# Patient Record
Sex: Female | Born: 1945 | Race: White | Hispanic: No | Smoking: Never smoker
Health system: Southern US, Community
[De-identification: ages and names within clinical notes are randomized; demographics above are authoritative.]

## PROBLEM LIST (undated history)

## (undated) DIAGNOSIS — C801 Malignant (primary) neoplasm, unspecified: Secondary | ICD-10-CM

## (undated) DIAGNOSIS — R112 Nausea with vomiting, unspecified: Secondary | ICD-10-CM

## (undated) DIAGNOSIS — Z9889 Other specified postprocedural states: Secondary | ICD-10-CM

## (undated) DIAGNOSIS — I10 Essential (primary) hypertension: Secondary | ICD-10-CM

## (undated) HISTORY — PX: ABDOMINAL HYSTERECTOMY: SHX81

## (undated) HISTORY — PX: MASTECTOMY: SHX3

---

## 1994-08-04 DIAGNOSIS — C801 Malignant (primary) neoplasm, unspecified: Secondary | ICD-10-CM

## 1994-08-04 HISTORY — DX: Malignant (primary) neoplasm, unspecified: C80.1

## 1999-05-07 ENCOUNTER — Emergency Department (HOSPITAL_COMMUNITY): Admission: EM | Admit: 1999-05-07 | Discharge: 1999-05-07 | Payer: Self-pay | Admitting: Emergency Medicine

## 2000-10-23 ENCOUNTER — Ambulatory Visit (HOSPITAL_COMMUNITY): Admission: RE | Admit: 2000-10-23 | Discharge: 2000-10-23 | Payer: Self-pay | Admitting: Gastroenterology

## 2000-10-23 ENCOUNTER — Encounter (INDEPENDENT_AMBULATORY_CARE_PROVIDER_SITE_OTHER): Payer: Self-pay | Admitting: Specialist

## 2001-07-12 ENCOUNTER — Other Ambulatory Visit: Admission: RE | Admit: 2001-07-12 | Discharge: 2001-07-12 | Payer: Self-pay | Admitting: *Deleted

## 2009-02-23 ENCOUNTER — Ambulatory Visit: Payer: Self-pay | Admitting: Genetic Counselor

## 2016-10-22 ENCOUNTER — Emergency Department (HOSPITAL_COMMUNITY): Payer: Medicare Other

## 2016-10-22 ENCOUNTER — Encounter (HOSPITAL_COMMUNITY): Payer: Self-pay | Admitting: Emergency Medicine

## 2016-10-22 ENCOUNTER — Inpatient Hospital Stay (HOSPITAL_COMMUNITY)
Admission: EM | Admit: 2016-10-22 | Discharge: 2016-10-30 | DRG: 023 | Disposition: A | Discharge status: Rehabilitation Facility | Payer: Medicare Other | Attending: Internal Medicine | Admitting: Internal Medicine

## 2016-10-22 DIAGNOSIS — G9340 Encephalopathy, unspecified: Secondary | ICD-10-CM | POA: Diagnosis present

## 2016-10-22 DIAGNOSIS — Z79899 Other long term (current) drug therapy: Secondary | ICD-10-CM | POA: Diagnosis not present

## 2016-10-22 DIAGNOSIS — Z6827 Body mass index (BMI) 27.0-27.9, adult: Secondary | ICD-10-CM | POA: Diagnosis not present

## 2016-10-22 DIAGNOSIS — I10 Essential (primary) hypertension: Secondary | ICD-10-CM | POA: Diagnosis present

## 2016-10-22 DIAGNOSIS — Z800 Family history of malignant neoplasm of digestive organs: Secondary | ICD-10-CM | POA: Diagnosis not present

## 2016-10-22 DIAGNOSIS — T82868A Thrombosis of vascular prosthetic devices, implants and grafts, initial encounter: Secondary | ICD-10-CM | POA: Diagnosis not present

## 2016-10-22 DIAGNOSIS — D496 Neoplasm of unspecified behavior of brain: Secondary | ICD-10-CM

## 2016-10-22 DIAGNOSIS — Z90710 Acquired absence of both cervix and uterus: Secondary | ICD-10-CM | POA: Diagnosis not present

## 2016-10-22 DIAGNOSIS — D72820 Lymphocytosis (symptomatic): Secondary | ICD-10-CM | POA: Diagnosis not present

## 2016-10-22 DIAGNOSIS — F419 Anxiety disorder, unspecified: Secondary | ICD-10-CM | POA: Diagnosis not present

## 2016-10-22 DIAGNOSIS — Z853 Personal history of malignant neoplasm of breast: Secondary | ICD-10-CM | POA: Diagnosis not present

## 2016-10-22 DIAGNOSIS — Z95828 Presence of other vascular implants and grafts: Secondary | ICD-10-CM | POA: Diagnosis not present

## 2016-10-22 DIAGNOSIS — E876 Hypokalemia: Secondary | ICD-10-CM | POA: Diagnosis present

## 2016-10-22 DIAGNOSIS — Z9889 Other specified postprocedural states: Secondary | ICD-10-CM | POA: Diagnosis not present

## 2016-10-22 DIAGNOSIS — R739 Hyperglycemia, unspecified: Secondary | ICD-10-CM | POA: Diagnosis present

## 2016-10-22 DIAGNOSIS — R4702 Dysphasia: Secondary | ICD-10-CM | POA: Diagnosis present

## 2016-10-22 DIAGNOSIS — G9389 Other specified disorders of brain: Secondary | ICD-10-CM

## 2016-10-22 DIAGNOSIS — R4701 Aphasia: Secondary | ICD-10-CM | POA: Diagnosis present

## 2016-10-22 DIAGNOSIS — Z803 Family history of malignant neoplasm of breast: Secondary | ICD-10-CM | POA: Diagnosis not present

## 2016-10-22 DIAGNOSIS — T380X5A Adverse effect of glucocorticoids and synthetic analogues, initial encounter: Secondary | ICD-10-CM | POA: Diagnosis present

## 2016-10-22 DIAGNOSIS — B954 Other streptococcus as the cause of diseases classified elsewhere: Secondary | ICD-10-CM | POA: Diagnosis present

## 2016-10-22 DIAGNOSIS — R4189 Other symptoms and signs involving cognitive functions and awareness: Secondary | ICD-10-CM | POA: Diagnosis not present

## 2016-10-22 DIAGNOSIS — G936 Cerebral edema: Secondary | ICD-10-CM | POA: Diagnosis present

## 2016-10-22 DIAGNOSIS — R269 Unspecified abnormalities of gait and mobility: Secondary | ICD-10-CM | POA: Diagnosis not present

## 2016-10-22 DIAGNOSIS — G060 Intracranial abscess and granuloma: Principal | ICD-10-CM | POA: Diagnosis present

## 2016-10-22 DIAGNOSIS — Z9011 Acquired absence of right breast and nipple: Secondary | ICD-10-CM | POA: Diagnosis not present

## 2016-10-22 DIAGNOSIS — G939 Disorder of brain, unspecified: Secondary | ICD-10-CM | POA: Diagnosis not present

## 2016-10-22 DIAGNOSIS — A419 Sepsis, unspecified organism: Secondary | ICD-10-CM

## 2016-10-22 DIAGNOSIS — R410 Disorientation, unspecified: Secondary | ICD-10-CM

## 2016-10-22 DIAGNOSIS — E871 Hypo-osmolality and hyponatremia: Secondary | ICD-10-CM | POA: Diagnosis not present

## 2016-10-22 DIAGNOSIS — B2790 Infectious mononucleosis, unspecified without complication: Secondary | ICD-10-CM | POA: Diagnosis not present

## 2016-10-22 DIAGNOSIS — M7989 Other specified soft tissue disorders: Secondary | ICD-10-CM | POA: Diagnosis not present

## 2016-10-22 DIAGNOSIS — R55 Syncope and collapse: Secondary | ICD-10-CM | POA: Diagnosis not present

## 2016-10-22 DIAGNOSIS — B9689 Other specified bacterial agents as the cause of diseases classified elsewhere: Secondary | ICD-10-CM | POA: Diagnosis not present

## 2016-10-22 DIAGNOSIS — Z882 Allergy status to sulfonamides status: Secondary | ICD-10-CM | POA: Diagnosis not present

## 2016-10-22 DIAGNOSIS — K5900 Constipation, unspecified: Secondary | ICD-10-CM | POA: Diagnosis not present

## 2016-10-22 HISTORY — DX: Malignant (primary) neoplasm, unspecified: C80.1

## 2016-10-22 HISTORY — DX: Essential (primary) hypertension: I10

## 2016-10-22 LAB — I-STAT TROPONIN, ED: Troponin i, poc: 0.02 ng/mL (ref 0.00–0.08)

## 2016-10-22 LAB — CBC WITH DIFFERENTIAL/PLATELET
BASOS ABS: 0 10*3/uL (ref 0.0–0.1)
Basophils Relative: 0 %
EOS PCT: 0 %
Eosinophils Absolute: 0 10*3/uL (ref 0.0–0.7)
HCT: 35.4 % — ABNORMAL LOW (ref 36.0–46.0)
HEMOGLOBIN: 12 g/dL (ref 12.0–15.0)
LYMPHS PCT: 21 %
Lymphs Abs: 1.4 10*3/uL (ref 0.7–4.0)
MCH: 29.1 pg (ref 26.0–34.0)
MCHC: 33.9 g/dL (ref 30.0–36.0)
MCV: 85.7 fL (ref 78.0–100.0)
Monocytes Absolute: 0.7 10*3/uL (ref 0.1–1.0)
Monocytes Relative: 10 %
NEUTROS ABS: 4.7 10*3/uL (ref 1.7–7.7)
NEUTROS PCT: 69 %
PLATELETS: 149 10*3/uL — AB (ref 150–400)
RBC: 4.13 MIL/uL (ref 3.87–5.11)
RDW: 12.9 % (ref 11.5–15.5)
WBC: 6.8 10*3/uL (ref 4.0–10.5)

## 2016-10-22 LAB — CBG MONITORING, ED: Glucose-Capillary: 104 mg/dL — ABNORMAL HIGH (ref 65–99)

## 2016-10-22 LAB — SALICYLATE LEVEL: Salicylate Lvl: <7 mg/dL (ref 2.8–30.0)

## 2016-10-22 LAB — URINALYSIS, ROUTINE W REFLEX MICROSCOPIC
Bilirubin Urine: NEGATIVE
Glucose, UA: NEGATIVE mg/dL
Hgb urine dipstick: NEGATIVE
KETONES UR: 20 mg/dL — AB
Leukocytes, UA: NEGATIVE
Nitrite: NEGATIVE
PROTEIN: 30 mg/dL — AB
Specific Gravity, Urine: 1.017 (ref 1.005–1.030)
pH: 6 (ref 5.0–8.0)

## 2016-10-22 LAB — I-STAT CHEM 8, ED
BUN: 11 mg/dL (ref 6–20)
CALCIUM ION: 0.94 mmol/L — AB (ref 1.15–1.40)
CREATININE: 0.7 mg/dL (ref 0.44–1.00)
Chloride: 107 mmol/L (ref 101–111)
Glucose, Bld: 99 mg/dL (ref 65–99)
HCT: 39 % (ref 36.0–46.0)
Hemoglobin: 13.3 g/dL (ref 12.0–15.0)
Potassium: 3.4 mmol/L — ABNORMAL LOW (ref 3.5–5.1)
SODIUM: 141 mmol/L (ref 135–145)
TCO2: 24 mmol/L (ref 0–100)

## 2016-10-22 LAB — TSH: TSH: 1.208 u[IU]/mL (ref 0.350–4.500)

## 2016-10-22 LAB — RAPID URINE DRUG SCREEN, HOSP PERFORMED
Amphetamines: NOT DETECTED
BENZODIAZEPINES: POSITIVE — AB
Barbiturates: NOT DETECTED
Cocaine: NOT DETECTED
OPIATES: NOT DETECTED
Tetrahydrocannabinol: NOT DETECTED

## 2016-10-22 LAB — ACETAMINOPHEN LEVEL: Acetaminophen (Tylenol), Serum: <10 ug/mL — ABNORMAL LOW (ref 10–30)

## 2016-10-22 LAB — ETHANOL: Alcohol, Ethyl (B): <5 mg/dL (ref <5)

## 2016-10-22 MED ORDER — DEXAMETHASONE SODIUM PHOSPHATE 10 MG/ML IJ SOLN
10.0000 mg | Freq: Once | INTRAMUSCULAR | Status: AC
  Administered 2016-10-22: 10 mg via INTRAVENOUS
  Filled 2016-10-22: qty 1

## 2016-10-22 MED ORDER — LORAZEPAM 2 MG/ML IJ SOLN: 1.0000 mg | Freq: Once | INTRAMUSCULAR | Status: DC

## 2016-10-22 MED ORDER — GADOBENATE DIMEGLUMINE 529 MG/ML IV SOLN
15.0000 mL | Freq: Once | INTRAVENOUS | Status: AC | PRN
  Administered 2016-10-22: 15 mL via INTRAVENOUS

## 2016-10-22 MED ORDER — DEXAMETHASONE SODIUM PHOSPHATE 10 MG/ML IJ SOLN
40.0000 mg | Freq: Once | INTRAMUSCULAR | Status: DC
  Filled 2016-10-22: qty 4

## 2016-10-22 MED ORDER — SODIUM CHLORIDE 0.9 % IV BOLUS (SEPSIS)
1000.0000 mL | Freq: Once | INTRAVENOUS | Status: AC
  Administered 2016-10-22: 1000 mL via INTRAVENOUS

## 2016-10-22 NOTE — ED Notes (Signed)
Attempted twice to get blood samples from left forearm and right AC Gertie Fey, PA gave permission to stick the right arm). Will ask lab to stick.

## 2016-10-22 NOTE — Consult Note (Signed)
Reason for Consult:brain mass Referring Physician: EDP  Alexandria [name] is an 71 y.o. female.   HPI:  71 year old female with gradually progressive symptoms over the last 24 hours. She complains of left frontal headache. Family states there has been some change in her mental status or fluency. An eyes weakness or numbness or tingling no vomiting. CT scan showed left frontal edema and MRI showed an enhancing left frontal mass. Neurosurgical evaluation was requested. No recent infection. History of breast cancer remotely. Nonsmoker  Past Medical History:  Diagnosis Date  . Cancer [facility])    breast cx  . Hypertension     Past Surgical History:  Procedure Laterality Date  . MASTECTOMY     right breast    Allergies  Allergen Reactions  . Sulfa Antibiotics     Social History  Substance Use Topics  . Smoking status: Never Smoker  . Smokeless tobacco: Never Used  . Alcohol use Yes     Comment: occasional    History reviewed. No pertinent family history.   Review of Systems  Positive ROS: neg  All other systems have been reviewed and were otherwise negative with the exception of those mentioned in the HPI and as above.  Objective: Vital signs in last 24 hours: Temp:  [98.1 F (36.7 C)-98.9 F (37.2 C)] 98.1 F (36.7 C) (03/21 2253) Pulse Rate:  [107-124] 107 (03/21 2253) Resp:  [16-23] 23 (03/21 2253) BP: (146-186)/(98-132) 165/98 (03/21 2253) SpO2:  [92 %-96 %] 92 % (03/21 2253) Weight:  [71.2 kg (157 lb)] 71.2 kg (157 lb) (03/21 1516)  General Appearance: Alert, cooperative, no distress, appears stated age Head: Normocephalic, without obvious abnormality, atraumatic Eyes: PERRL, conjunctiva/corneas clear, EOM's intact    Neck: Supple Lungs:  respirations unlabored Heart: Regular rate and rhythm Abdomen: Soft  NEUROLOGIC:   Mental status: A&O x4, no aphasia other than some decrease in fluency, good attention span, Memory and fund of knowledge seem to be  okay Motor Exam - grossly normal, normal tone and bulk, no pronator drift Sensory Exam - grossly normal Reflexes: symmetric, no pathologic reflexes, No Hoffman's, No clonus Coordination - grossly normal Gait - not tested Balance - not tested Cranial Nerves: I: smell Not tested  II: visual acuity  OS: na    OD: na  II: visual fields Full to confrontation  II: pupils Equal, round, reactive to light  III,VII: ptosis None  III,IV,VI: extraocular muscles  Full ROM  V: mastication Normal  V: facial light touch sensation  Normal  V,VII: corneal reflex  Present  VII: facial muscle function - upper  Normal  VII: facial muscle function - lower Normal  VIII: hearing Not tested  IX: soft palate elevation  Normal  IX,X: gag reflex Present  XI: trapezius strength  5/5  XI: sternocleidomastoid strength 5/5  XI: neck flexion strength  5/5  XII: tongue strength  Normal    Data Review Lab Results  Component Value Date   WBC 6.8 10/22/2016   HGB 12.0 10/22/2016   HCT 35.4 (L) 10/22/2016   MCV 85.7 10/22/2016   PLT 149 (L) 10/22/2016   Lab Results  Component Value Date   NA 140 10/22/2016   K 2.6 (LL) 10/22/2016   CL 109 10/22/2016   CO2 24 10/22/2016   BUN 9 10/22/2016   CREATININE 0.64 10/22/2016   GLUCOSE 118 (H) 10/22/2016   No results found for: INR, PROTIME  Radiology: Dg Chest 2 View  Result Date: 10/22/2016 CLINICAL DATA:  Increased confusion history of brain cancer EXAM: CHEST  2 VIEW COMPARISON:  None. FINDINGS: Surgical clips in the right axilla. There is a oval retrocardiac opacity which presumably represents a large hiatal hernia. Cannot exclude atelectasis or infiltrate at the left base. No pneumothorax. IMPRESSION: 1. Difficult to exclude atelectasis or infiltrate at the left lung base 2. Retrocardiac opacity likely reflects a moderate to large hiatal hernia. Electronically Signed   By: Donavan Foil M.D.   On: 10/22/2016 21:32   Ct Head Wo Contrast  Result Date:  10/22/2016 CLINICAL DATA:  Altered mental status since yesterday. EXAM: CT HEAD WITHOUT CONTRAST TECHNIQUE: Contiguous axial images were obtained from the base of the skull through the vertex without intravenous contrast. COMPARISON:  None. FINDINGS: Brain: Masslike lesion within the peripheral aspects of the left frontal lobe, measuring approximately 2.7 x 2.5 cm, with surrounding vasogenic edema. Associated local mass effect with sulcal effacement and slight rightward midline shift of the anterior falx. No intracranial hemorrhage seen. Vascular: There are chronic calcified atherosclerotic changes of the large vessels at the skull base. No unexpected hyperdense vessel. Skull: Normal. Negative for fracture or focal lesion. Sinuses/Orbits: No acute finding. Other: None. IMPRESSION: 1. Masslike lesion within the left frontal lobe, measuring approximately 2.7 x 2.5 cm, with surrounding vasogenic edema, highly suspicious for neoplastic mass. Ordering physician tells me that patient has a history of breast cancer, therefore, this is a suspected intracranial metastasis. Recommend MRI brain with contrast for further characterization. 2. Local mass effect within the left frontal lobe with an associated mild rightward midline shift of the anterior falx. 3. No intracranial hemorrhage seen. These results were called by telephone at the time of interpretation on 10/22/2016 at 4:55 pm to Dr. Domenic Moras , who verbally acknowledged these results. Electronically Signed   By: Franki Cabot M.D.   On: 10/22/2016 16:59   Mr Jeri Cos IR Contrast  Result Date: 10/22/2016 CLINICAL DATA:  Altered mental status beginning yesterday. Increased confusion. Abnormal CT scan frontal brain mass. EXAM: MRI HEAD WITHOUT AND WITH CONTRAST TECHNIQUE: Multiplanar, multiecho pulse sequences of the brain and surrounding structures were obtained without and with intravenous contrast. CONTRAST:  15 mL MultiHance COMPARISON:  CT head without contrast  from the same day. FINDINGS: Brain: A peripherally enhancing left frontal lesion demonstrates a somewhat irregular thickened periphery. There is some T1 shortening along the rim of the lesion as well. The central contents demonstrate restricted diffusion. There is marked surrounding edema results in mass effect and effacement of the adjacent sulci. Minimal midline shift is noted. The enhancement involves the adjacent aura extends along the anterior left frontal convexity. No other focal areas of restricted diffusion or enhancement are present. There are is no acute hemorrhage or other mass lesion. No acute infarct is present. The internal auditory canals are within normal limits bilaterally. The brainstem and cerebellum are normal. Vascular: Flow is present in the major intracranial arteries. Skull and upper cervical spine: The skullbase is within normal limits. Midline sagittal structures are unremarkable. The craniocervical junction is normal. Marrow signal is normal. The upper cervical spine is within normal limits. Sinuses/Orbits: Mild mucosal thickening is present within the ethmoid air cells bilaterally. The remaining paranasal sinuses and mastoid air cells are clear. IMPRESSION: 1. Irregular peripherally enhancing mass lesion in the anterior left frontal lobe measuring at 2.6 x 3.0 x 2.0 cm with associated enhancement of the adjacent dura and central restricted diffusion is most consistent with an abscess. Neoplasm is considered  much less likely. 2. No other discrete lesions. 3. Minimal ethmoid sinus disease. These results were called by telephone at the time of interpretation on 10/22/2016 at 8:33 pm to [facility] PA , who verbally acknowledged these results. Electronically Signed   By: San Morelle M.D.   On: 10/22/2016 20:37     Assessment/Plan: 71 year old female with a left frontal enhancing lesion, history of breast cancer. Metastatic or primary brain cancer would still be much more likely  than abscess or resolving hematoma, even given the imaging findings being consistent with abscess. She will likely need surgical resection or biopsy to confirm pathologic diagnosis.  Would start Decadron 4q6 - this is not contraindicated even with abscess and will reduce edema and therefore reduce headache and symptoms  Would check blood cultures  Would check CT of the chest abdomen and pelvis to rule out primary  Probably would not start antibiotics empirically and would await cultures/ biopsy/ resection as long as she remains neurologically stable  Family prefers transfer to [facility]. At present no bed is available. She can be admitted to medicine service for further workup and treatment until transfer can be arranged.   Mikylah Ackroyd S 10/22/2016 11:28 PM

## 2016-10-22 NOTE — ED Notes (Signed)
Pt still in MRI 

## 2016-10-22 NOTE — H&P (Signed)
History and Physical    Alexandria [name] Alexandria [name] DOB: [birth year] DOA: 10/22/2016  PCP: Osborne Casco, MD Consultants:  None Patient coming from: home -  Lives alone; NOK: daughter or son-in-law, [phone], [phone]  Chief Complaint: altered mental status  HPI: Alexandria [name] is a 71 y.o. female with medical history significant of remote h/o breast cancer and HTN presenting with AMS.  When asked to tell me why she came to the ER today, her response was "You think that I can?"  Her son-in-law gave the majority of the history, although she is generally able to respond to yes/no questions fairly accurately.  He reports that she has not been feeling the last couple of days.  Difficulty communicating for the last few days with a bit of confusion.  Single-word answers instead of being verbose.  She has also been very sad, which is out of character for her. Family spoke to her Sunday and she seemed to be okay.  Monday evenig about 7pm she was clearly upset - although the family thought it was about a conversation she had with her ex-husband,.  Tuesday, they checked on her several times.  She would have moments of clarity and then revert back to simple speech and confusion. Today, they took her to lunch and came here immediately afterward.  Left fronto-temporal headache.  +palpitations.  +DOE.  Minimal PO intake in the last few days.   ED Course: +tachycardia with headache - initial concern for HTN emergency.  Head CT demonstrated a masslike lesion in the left frontal lobe with surrounding vasogenic edema and localized mass effect.  Oncology was concerned about malignancy but suggested MRI, Decadron, and neurosurgery consultation.  MRI was concerning for abscess > malignancy.  Neurosurgery saw the patient and recommended admission at [facility] with surveillance CT C/A/P, blood cultures, lactate, and Decadron 4 mg q6h.  The family would like very much to go to [facility] but there are no beds  available at [facility] at this time.  Review of Systems: As per HPI; otherwise 10 point review of systems reviewed and negative.  This was obtained as best as was possible in these circumstances.  Ambulatory Status:  Ambulates without assistance  Past Medical History:  Diagnosis Date  . Cancer [facility]) 1996   breast cx  . Hypertension     Past Surgical History:  Procedure Laterality Date  . ABDOMINAL HYSTERECTOMY    . MASTECTOMY     right breast    Social History   Social History  . Marital status: Single    Spouse name: N/A  . Number of children: N/A  . Years of education: N/A   Occupational History  . retired Freight forwarder    Social History Main Topics  . Smoking status: Never Smoker  . Smokeless tobacco: Never Used  . Alcohol use Yes     Comment: occasional  . Drug use: No  . Sexual activity: Not on file   Other Topics Concern  . Not on file   Social History Narrative   1 lifetime sexual partner, last intercourse about 7 years ago?  No drugs including illicits.    Allergies  Allergen Reactions  . Sulfa Antibiotics     Family History  Problem Relation Age of Onset  . Breast cancer Mother   . Colon cancer Father     Prior to Admission medications   Medication Sig Start Date End Date Taking? Authorizing Provider  ALPRAZolam (XANAX) 0.25 MG tablet Take 0.125-0.25 mg by  mouth at bedtime as needed for anxiety.    Yes Historical Provider, MD  benazepril-hydrochlorthiazide (LOTENSIN HCT) 20-25 MG tablet Take 1 tablet by mouth daily.  08/05/16   Historical Provider, MD  metoprolol succinate (TOPROL-XL) 100 MG 24 hr tablet Take 100 mg by mouth every evening.  10/10/16   Historical Provider, MD    Physical Exam: Vitals:   10/23/16 0015 10/23/16 0030 10/23/16 0045 10/23/16 0143  BP:  (!) 176/116  (!) 160/87  Pulse: (!) 108 (!) 111 (!) 102 (!) 107  Resp: 16 (!) 24 (!) 25 20  Temp:    98.2 F (36.8 C)  TempSrc:    Oral  SpO2: 94% 93% 92% 95%  Weight:    74.9 kg (165  lb 2 oz)  Height:    5\' 5"  (1.651 m)     General:  Appears anxious and somewhat inappropriate - laughs at odd times, communication obviously limited Eyes:  PERRL, EOMI, normal lids, iris ENT:  grossly normal hearing, lips & tongue, mmm Neck:  no LAD, masses or thyromegaly Cardiovascular:  Tachycardia, no m/r/g. No LE edema.  Respiratory:  CTA bilaterally, no w/r/r. Normal respiratory effort. Abdomen:  soft, ntnd, NABS Skin:  no rash or induration seen on limited exam Musculoskeletal:  grossly normal tone BUE/BLE, good ROM, no bony abnormality Psychiatric:  Oriented only to person.  Laughs inappropriately.  Able to answer yes/no questions and occasionally other questions but overall quite altered Neurologic:  CN 2-12 grossly intact, moves all extremities in coordinated fashion, sensation intact  Labs on Admission: I have personally reviewed following labs and imaging studies  CBC:  Recent Labs Lab 10/22/16 1835 10/22/16 2111  WBC  --  6.8  NEUTROABS  --  4.7  HGB 13.3 12.0  HCT 39.0 35.4*  MCV  --  85.7  PLT  --  124*   Basic Metabolic Panel:  Recent Labs Lab 10/22/16 1835 10/22/16 2111  NA 141 140  K 3.4* 2.6*  CL 107 109  CO2  --  24  GLUCOSE 99 118*  BUN 11 9  CREATININE 0.70 0.64  CALCIUM  --  8.8*   GFR: Estimated Creatinine Clearance: 65.4 mL/min (by C-G formula based on SCr of 0.64 mg/dL). Liver Function Tests:  Recent Labs Lab 10/22/16 2111  AST 16  ALT 12*  ALKPHOS 49  BILITOT 1.0  PROT 6.6  ALBUMIN 3.8   No results for input(s): LIPASE, AMYLASE in the last 168 hours. No results for input(s): AMMONIA in the last 168 hours. Coagulation Profile: No results for input(s): INR, PROTIME in the last 168 hours. Cardiac Enzymes: No results for input(s): CKTOTAL, CKMB, CKMBINDEX, TROPONINI in the last 168 hours. BNP (last 3 results) No results for input(s): PROBNP in the last 8760 hours. HbA1C: No results for input(s): HGBA1C in the last 72  hours. CBG:  Recent Labs Lab 10/22/16 1707  GLUCAP 104*   Lipid Profile: No results for input(s): CHOL, HDL, LDLCALC, TRIG, CHOLHDL, LDLDIRECT in the last 72 hours. Thyroid Function Tests:  Recent Labs  10/22/16 2111  TSH 1.208   Anemia Panel: No results for input(s): VITAMINB12, FOLATE, FERRITIN, TIBC, IRON, RETICCTPCT in the last 72 hours. Urine analysis:    Component Value Date/Time   COLORURINE YELLOW 10/22/2016 1546   APPEARANCEUR HAZY (A) 10/22/2016 1546   LABSPEC 1.017 10/22/2016 1546   PHURINE 6.0 10/22/2016 1546   GLUCOSEU NEGATIVE 10/22/2016 1546   HGBUR NEGATIVE 10/22/2016 1546   BILIRUBINUR NEGATIVE 10/22/2016 1546  KETONESUR 20 (A) 10/22/2016 1546   PROTEINUR 30 (A) 10/22/2016 1546   NITRITE NEGATIVE 10/22/2016 1546   LEUKOCYTESUR NEGATIVE 10/22/2016 1546    Creatinine Clearance: Estimated Creatinine Clearance: 65.4 mL/min (by C-G formula based on SCr of 0.64 mg/dL).  Sepsis Labs: @LABRCNTIP (procalcitonin:4,lacticidven:4) )No results found for this or any previous visit (from the past 240 hour(s)).   Radiological Exams on Admission: Dg Chest 2 View  Result Date: 10/22/2016 CLINICAL DATA:  Increased confusion history of brain cancer EXAM: CHEST  2 VIEW COMPARISON:  None. FINDINGS: Surgical clips in the right axilla. There is a oval retrocardiac opacity which presumably represents a large hiatal hernia. Cannot exclude atelectasis or infiltrate at the left base. No pneumothorax. IMPRESSION: 1. Difficult to exclude atelectasis or infiltrate at the left lung base 2. Retrocardiac opacity likely reflects a moderate to large hiatal hernia. Electronically Signed   By: Donavan Foil M.D.   On: 10/22/2016 21:32   Ct Head Wo Contrast  Result Date: 10/22/2016 CLINICAL DATA:  Altered mental status since yesterday. EXAM: CT HEAD WITHOUT CONTRAST TECHNIQUE: Contiguous axial images were obtained from the base of the skull through the vertex without intravenous contrast.  COMPARISON:  None. FINDINGS: Brain: Masslike lesion within the peripheral aspects of the left frontal lobe, measuring approximately 2.7 x 2.5 cm, with surrounding vasogenic edema. Associated local mass effect with sulcal effacement and slight rightward midline shift of the anterior falx. No intracranial hemorrhage seen. Vascular: There are chronic calcified atherosclerotic changes of the large vessels at the skull base. No unexpected hyperdense vessel. Skull: Normal. Negative for fracture or focal lesion. Sinuses/Orbits: No acute finding. Other: None. IMPRESSION: 1. Masslike lesion within the left frontal lobe, measuring approximately 2.7 x 2.5 cm, with surrounding vasogenic edema, highly suspicious for neoplastic mass. Ordering physician tells me that patient has a history of breast cancer, therefore, this is a suspected intracranial metastasis. Recommend MRI brain with contrast for further characterization. 2. Local mass effect within the left frontal lobe with an associated mild rightward midline shift of the anterior falx. 3. No intracranial hemorrhage seen. These results were called by telephone at the time of interpretation on 10/22/2016 at 4:55 pm to Dr. Domenic Moras , who verbally acknowledged these results. Electronically Signed   By: Franki Cabot M.D.   On: 10/22/2016 16:59   Mr Jeri Cos NK Contrast  Result Date: 10/22/2016 CLINICAL DATA:  Altered mental status beginning yesterday. Increased confusion. Abnormal CT scan frontal brain mass. EXAM: MRI HEAD WITHOUT AND WITH CONTRAST TECHNIQUE: Multiplanar, multiecho pulse sequences of the brain and surrounding structures were obtained without and with intravenous contrast. CONTRAST:  15 mL MultiHance COMPARISON:  CT head without contrast from the same day. FINDINGS: Brain: A peripherally enhancing left frontal lesion demonstrates a somewhat irregular thickened periphery. There is some T1 shortening along the rim of the lesion as well. The central contents  demonstrate restricted diffusion. There is marked surrounding edema results in mass effect and effacement of the adjacent sulci. Minimal midline shift is noted. The enhancement involves the adjacent aura extends along the anterior left frontal convexity. No other focal areas of restricted diffusion or enhancement are present. There are is no acute hemorrhage or other mass lesion. No acute infarct is present. The internal auditory canals are within normal limits bilaterally. The brainstem and cerebellum are normal. Vascular: Flow is present in the major intracranial arteries. Skull and upper cervical spine: The skullbase is within normal limits. Midline sagittal structures are unremarkable. The craniocervical  junction is normal. Marrow signal is normal. The upper cervical spine is within normal limits. Sinuses/Orbits: Mild mucosal thickening is present within the ethmoid air cells bilaterally. The remaining paranasal sinuses and mastoid air cells are clear. IMPRESSION: 1. Irregular peripherally enhancing mass lesion in the anterior left frontal lobe measuring at 2.6 x 3.0 x 2.0 cm with associated enhancement of the adjacent dura and central restricted diffusion is most consistent with an abscess. Neoplasm is considered much less likely. 2. No other discrete lesions. 3. Minimal ethmoid sinus disease. These results were called by telephone at the time of interpretation on 10/22/2016 at 8:33 pm to [facility] PA , who verbally acknowledged these results. Electronically Signed   By: San Morelle M.D.   On: 10/22/2016 20:37    EKG: Independently reviewed.  Sinus tachycardia with rate 122; rate related changes with no evidence of acute ischemia  Assessment/Plan Principal Problem:   Acute encephalopathy Active Problems:   Brain abscess   Hx of breast cancer   Essential hypertension   Hypokalemia    Acute encephalopathy -Patient presenting with unusual behaviors and difficulty communicating -CT  concerning for solitary mass, likely malignancy -MRI is more suggestive of possible brain abscess -Either way, patient needs admission with further evaluation and treatment -We spent a long time trying to help get the patient to [facility] - they very much would like to pursue treatment there.  However, [facility] has no beds and it is important to facilitate her evaluation as quickly as we reasonably can.  While they even considered signing the patient out AMA, they ultimately decided to proceed with treatment here at [facility].  It is possible that the patient will obtain a bed tomorrow and be able to transfer then; otherwise, they are willing to proceed with treatment at [facility]. -She has been started on Decadron 4 mg IV q6h. -Sepsis order set utilized with blood and urine cultures ordered as well as lactate, procalcitonin. -CT C/A/P with/without contrast ordered to evaluate for possible primary source of malignancy. -No antibiotics for now, while awaiting blood and urine cultures as well as brain biopsy vs. Resection. -She is likely to need a PICC line. -She is also likely to need placement once this situation has stabilized, as she clearly is not currently safe to be living alone. -Oncology has been consulted about the patient but likely will not to be further involved until the determination is made about whether this is malignancy. -If abscess, ID likely needs to be involved.  She specifically denies high-risk behaviors including sexual relations (last intercourse was with her unfaithful husband, but this was years ago) or illicit drug use. -Elevated glucose may be a stress response or may be from steroids (uncertain about timing); will follow with fasting AM labs.  Hypokalemia -K 2.6 -Treat with K-Dur x 1 and IV run of KCl -Recheck in AM  Hypertension -Continue Toprol XL -Hold Benazepril/HCTZ due to hypokalemia -Cover with Hydralazine IV for SBP >180   DVT prophylaxis: SCDs Code Status:  Full - confirmed  with patient/family Family Communication: Son-in-law was present throughout evaluation and we also spoke with the patient's daughter by telephone  Disposition Plan: To be determined Consults called: Neurosurgery; oncology Admission status: Admit - It is my clinical opinion that admission to INPATIENT is reasonable and necessary because this patient will require at least 2 midnights in the hospital to treat this condition based on the medical complexity of the problems presented.  Given the aforementioned information, the predictability of an  adverse outcome is felt to be significant.    Karmen Bongo MD [facility]  If 7PM-7AM, please contact night-coverage [email or web address] Password [email or web address]  10/23/2016, 1:48 AM

## 2016-10-22 NOTE — ED Provider Notes (Signed)
[facility] DEPT Provider Note   CSN: [id number] Arrival date & time: 10/22/16  1457     History   Chief Complaint Chief Complaint  Patient presents with  . Altered Mental Status    HPI Alexandria [name] is a 71 y.o. female.  HPI   71 year old female with hx of breast CA s/p mastectomy in remission, HTN presenting for evaluation of altered mental status.  History obtained through daughter who is at bedside and through patient. Per daughter, patient has been her normal self leading up to about 2 days ago. After she had a phone conversation with her ex-husband about finances, she has not been the same since. Patient is laughing and talking inappropriately, having sporadic crying spell and appears to be more confused than usual. Patient does admit that she is depressed but she denies any homicidal or suicidal ideation. She also denies auditory or visual hallucination.  Her daughter did encourage patient to take her Xanax, which helps temporarily.  Given such a drastic change in her mental status, family brought her here for further evaluation. Patient has breast cancer in remission. Has history of hypertension but does not take her blood pressure medication. Level V caveats apply as patient answer inappropriately and sometimes laugh inappropriately. Patient does endorse a headache, described as "getting hit by 1000 sledge hammers".  Headache has been persistent for at least 3 days. She has had similar headaches like this in the past. She denies any URI symptoms, vision changes, nausea, vomiting, diarrhea, chest pain, shortness of breath, abdominal pain, dysuria, cough, focal numbness or weakness, dizziness or lightheadedness. She admits that she has been sleeping more than usual for the past 2 days and decreased appetite. She denies any alcohol or recreational drug use.  Past Medical History:  Diagnosis Date  . Cancer [facility])    breast cx  . Hypertension     There are no active problems  to display for this patient.   Past Surgical History:  Procedure Laterality Date  . MASTECTOMY     right breast    OB History    No data available       Home Medications    Prior to Admission medications   Not on File    Family History No family history on file.  Social History Social History  Substance Use Topics  . Smoking status: Never Smoker  . Smokeless tobacco: Never Used  . Alcohol use Yes     Comment: occasional     Allergies   Sulfa antibiotics   Review of Systems Review of Systems  Unable to perform ROS: Mental status change     Physical Exam Updated Vital Signs BP (!) 186/119 (BP Location: Right Arm)   Pulse (!) 124   Temp 98.3 F (36.8 C) (Oral)   Resp 16   Ht 5\' 2"  (1.575 m)   Wt 71.2 kg   SpO2 95%   BMI 28.72 kg/m   Physical Exam  Constitutional: She appears well-developed and well-nourished.  HENT:  Head: Atraumatic.  Right Ear: External ear normal.  Left Ear: External ear normal.  Mouth/Throat: Oropharynx is clear and moist.  Eyes: Conjunctivae and EOM are normal. Pupils are equal, round, and reactive to light.  Neck: Normal range of motion. Neck supple.  Cardiovascular: Intact distal pulses.   Tachycardia without murmurs rubs or gallops  Pulmonary/Chest: Effort normal and breath sounds normal.  Abdominal: Soft. She exhibits no distension. There is no tenderness.  Neurological: She is  alert.  Alert to self, place, year, and current president however when asked what month it is she responded "November" but later was able to give the correct month of "march".  Neurologic exam:  Speech clear, pupils equal round reactive to light, extraocular movements intact  Normal peripheral visual fields Cranial nerves III through XII normal including no facial droop Follows commands, moves all extremities x4, normal strength to bilateral upper and lower extremities at all major muscle groups including grip Sensation normal to light  touch Coordination intact, no limb ataxia, finger-nose-finger normal Rapid alternating movements normal No pronator drift Gait normal   Skin: No rash noted.  Psychiatric: She has a normal mood and affect. She is not combative. Thought content is not paranoid. Cognition and memory are impaired. She expresses inappropriate judgment. She expresses no homicidal and no suicidal ideation. She is communicative.  Patient laughing and crying inappropriately. Answer question inappropriately. Admits to being depressed.  Nursing note and vitals reviewed.    ED Treatments / Results  Labs (all labs ordered are listed, but only abnormal results are displayed) Labs Reviewed  URINALYSIS, ROUTINE W REFLEX MICROSCOPIC - Abnormal; Notable for the following:       Result Value   APPearance HAZY (*)    Ketones, ur 20 (*)    Protein, ur 30 (*)    Bacteria, UA MANY (*)    Squamous Epithelial / LPF 0-5 (*)    All other components within normal limits  RAPID URINE DRUG SCREEN, HOSP PERFORMED - Abnormal; Notable for the following:    Benzodiazepines POSITIVE (*)    All other components within normal limits  ACETAMINOPHEN LEVEL - Abnormal; Notable for the following:    Acetaminophen (Tylenol), Serum <10 (*)    All other components within normal limits  CBC WITH DIFFERENTIAL/PLATELET - Abnormal; Notable for the following:    HCT 35.4 (*)    Platelets 149 (*)    All other components within normal limits  COMPREHENSIVE METABOLIC PANEL - Abnormal; Notable for the following:    Potassium 2.6 (*)    Glucose, Bld 118 (*)    Calcium 8.8 (*)    ALT 12 (*)    All other components within normal limits  CBG MONITORING, ED - Abnormal; Notable for the following:    Glucose-Capillary 104 (*)    All other components within normal limits  I-STAT CHEM 8, ED - Abnormal; Notable for the following:    Potassium 3.4 (*)    Calcium, Ion 0.94 (*)    All other components within normal limits  ETHANOL  SALICYLATE LEVEL   TSH  CBC WITH DIFFERENTIAL/PLATELET  I-STAT TROPOININ, ED    EKG  EKG Interpretation  Date/Time:  Wednesday October 22 2016 15:49:58 EDT Ventricular Rate:  122 PR Interval:    QRS Duration: 93 QT Interval:  297 QTC Calculation: 424 R Axis:   74 Text Interpretation:  Sinus tachycardia Probable left atrial enlargement Borderline repolarization abnormality No significant change since last tracing Confirmed by Maryan Rued  MD, Loree Fee ([id number]) on 10/22/2016 4:12:10 PM       Radiology Dg Chest 2 View  Result Date: 10/22/2016 CLINICAL DATA:  Increased confusion history of brain cancer EXAM: CHEST  2 VIEW COMPARISON:  None. FINDINGS: Surgical clips in the right axilla. There is a oval retrocardiac opacity which presumably represents a large hiatal hernia. Cannot exclude atelectasis or infiltrate at the left base. No pneumothorax. IMPRESSION: 1. Difficult to exclude atelectasis or infiltrate at the left lung  base 2. Retrocardiac opacity likely reflects a moderate to large hiatal hernia. Electronically Signed   By: Donavan Foil M.D.   On: 10/22/2016 21:32   Ct Head Wo Contrast  Result Date: 10/22/2016 CLINICAL DATA:  Altered mental status since yesterday. EXAM: CT HEAD WITHOUT CONTRAST TECHNIQUE: Contiguous axial images were obtained from the base of the skull through the vertex without intravenous contrast. COMPARISON:  None. FINDINGS: Brain: Masslike lesion within the peripheral aspects of the left frontal lobe, measuring approximately 2.7 x 2.5 cm, with surrounding vasogenic edema. Associated local mass effect with sulcal effacement and slight rightward midline shift of the anterior falx. No intracranial hemorrhage seen. Vascular: There are chronic calcified atherosclerotic changes of the large vessels at the skull base. No unexpected hyperdense vessel. Skull: Normal. Negative for fracture or focal lesion. Sinuses/Orbits: No acute finding. Other: None. IMPRESSION: 1. Masslike lesion within the left  frontal lobe, measuring approximately 2.7 x 2.5 cm, with surrounding vasogenic edema, highly suspicious for neoplastic mass. Ordering physician tells me that patient has a history of breast cancer, therefore, this is a suspected intracranial metastasis. Recommend MRI brain with contrast for further characterization. 2. Local mass effect within the left frontal lobe with an associated mild rightward midline shift of the anterior falx. 3. No intracranial hemorrhage seen. These results were called by telephone at the time of interpretation on 10/22/2016 at 4:55 pm to Dr. Domenic Moras , who verbally acknowledged these results. Electronically Signed   By: Franki Cabot M.D.   On: 10/22/2016 16:59   Mr Jeri Cos DD Contrast  Result Date: 10/22/2016 CLINICAL DATA:  Altered mental status beginning yesterday. Increased confusion. Abnormal CT scan frontal brain mass. EXAM: MRI HEAD WITHOUT AND WITH CONTRAST TECHNIQUE: Multiplanar, multiecho pulse sequences of the brain and surrounding structures were obtained without and with intravenous contrast. CONTRAST:  15 mL MultiHance COMPARISON:  CT head without contrast from the same day. FINDINGS: Brain: A peripherally enhancing left frontal lesion demonstrates a somewhat irregular thickened periphery. There is some T1 shortening along the rim of the lesion as well. The central contents demonstrate restricted diffusion. There is marked surrounding edema results in mass effect and effacement of the adjacent sulci. Minimal midline shift is noted. The enhancement involves the adjacent aura extends along the anterior left frontal convexity. No other focal areas of restricted diffusion or enhancement are present. There are is no acute hemorrhage or other mass lesion. No acute infarct is present. The internal auditory canals are within normal limits bilaterally. The brainstem and cerebellum are normal. Vascular: Flow is present in the major intracranial arteries. Skull and upper cervical  spine: The skullbase is within normal limits. Midline sagittal structures are unremarkable. The craniocervical junction is normal. Marrow signal is normal. The upper cervical spine is within normal limits. Sinuses/Orbits: Mild mucosal thickening is present within the ethmoid air cells bilaterally. The remaining paranasal sinuses and mastoid air cells are clear. IMPRESSION: 1. Irregular peripherally enhancing mass lesion in the anterior left frontal lobe measuring at 2.6 x 3.0 x 2.0 cm with associated enhancement of the adjacent dura and central restricted diffusion is most consistent with an abscess. Neoplasm is considered much less likely. 2. No other discrete lesions. 3. Minimal ethmoid sinus disease. These results were called by telephone at the time of interpretation on 10/22/2016 at 8:33 pm to [facility] PA , who verbally acknowledged these results. Electronically Signed   By: San Morelle M.D.   On: 10/22/2016 20:37    Procedures  Procedures (including critical care time)  Medications Ordered in ED Medications  sodium chloride 0.9 % bolus 1,000 mL (1,000 mLs Intravenous New Bag/Given 10/22/16 1726)  gadobenate dimeglumine (MULTIHANCE) injection 15 mL (15 mLs Intravenous Contrast Given 10/22/16 2002)  dexamethasone (DECADRON) injection 10 mg (10 mg Intravenous Given 10/22/16 2254)     Initial Impression / Assessment and Plan / ED Course  I have reviewed the triage vital signs and the nursing notes.  Pertinent labs & imaging results that were available during my care of the patient were reviewed by me and considered in my medical decision making (see chart for details).     BP (!) 165/98 (BP Location: Left Arm)   Pulse (!) 107   Temp 98.1 F (36.7 C) (Oral)   Resp (!) 23   Ht 5\' 2"  (1.575 m)   Wt 71.2 kg   SpO2 92%   BMI 28.72 kg/m    Final Clinical Impressions(s) / ED Diagnoses   Final diagnoses:  Disorientation  Lesion of left frontal lobe of brain    New  Prescriptions New Prescriptions   No medications on file   3:56 PM Patient here for altered mental status likely secondary to recent stress after a phone conversation with her ex-husband about finances. However she does have history of remote breast cancer and given her age, she would need to also be screened for potential stroke or malignancy. She is tachycardic with a heart rate of 124 but does not appears to be dehydrated. She has elevated blood pressure of 186/119 and also endorsed headache. I will also need to check for potential hypertensive emergency.  5:06 PM Head CT scan demonstrated a masslike lesion within the peripheral aspects of the left frontal lobe measuring approximately 2.72.5 cm with surrounding vasogenic edema. Associated local mass effect with sulcal effacement and slight rightward midline shift of the anterior falx.  THis finding does support the presenting sxs as well as he remote hx of breast CA.  I discussed this finding with DR. Plunkett and also with family members and pt.  Will obtain brain MRI for further evaluation.  Anticipate consultation to oncology for further care.    7:28 PM Appreciate consultation from on call oncologist Dr. Martha Clan who felt malignancy likely due to a solitary lesion, but MRI will help differentiate it a bit more.  He recommend starting pt on 40mg  of Decadron IV.  Anticipate consultation with neurosurgeon once MRI resulted.  Anticipate admission.    MRI result showing finding concerning for brain abscess and less likely malignancy. K+ 2.6, likely a false reading since another K+ is 3.4.  Pt without ekg changes to suggest hypokalemia.    10:07 PM Appreciate consultation from neurosurgeon Dr. Sherley Bounds who request pt to be admitted to [facility] and he will see pt tomorrow for surgical intervention.  He also recommend starting pt on decadron 10mg  IV and 4mg  Q6 hrs  11:00 PM Family member request pt to be transfer to [facility] for further  management of her condition.  Ihave consulted oncall neurohospitalist at [facility], Dr. Parke Poisson who recommend ER to ER transfer.    11:29 PM I have consulted [facility] neurosurgeon Dr. Avis Epley who notify me that there are no bed available for at least tomorrow and possibly the next day.  He can put her on a wait list only.  I discussed this with family members, they prefer for pt to stay here for further care.  Neurosurgeon Dr. Ronnald Ramp did see and  evaluate pt at bedside. He felt pt will need further work up to determine between infection vs malignancy.  Pt will likely need chest/abd/pelvis CT to look for malignancy, and likely will need blood cultures, and lactic acid to assess for infection.  He also prefers pt to be transfer to [facility] for hospital admission.  I consulted [facility] Hospitalist Dr. Lorin Mercy who agrees to see pt in the ER and will facilitate transfer to [facility] for admission.  Pt currently stable, non toxic in appearance and agrees with plan.  Pt should also receive decadron 4mg  Q6 hrs after initial dose of 10mg .   CRITICAL CARE Performed by: Atzel Mccambridge Total critical care time: 35 minutes Critical care time was exclusive of separately billable procedures and treating other patients. Critical care was necessary to treat or prevent imminent or life-threatening deterioration. Critical care was time spent personally by me on the following activities: development of treatment plan with patient and/or surrogate as well as nursing, discussions with consultants, evaluation of patient's response to treatment, examination of patient, obtaining history from patient or surrogate, ordering and performing treatments and interventions, ordering and review of laboratory studies, ordering and review of radiographic studies, pulse oximetry and re-evaluation of patient's condition.    Domenic Moras, PA-C 10/22/16 [place], MD 10/23/16 [phone]

## 2016-10-22 NOTE — ED Notes (Signed)
Gertie Fey, PA is speaking with patients family.

## 2016-10-22 NOTE — ED Triage Notes (Signed)
Pt is here with daughter reporting altered mental status since yesterday.  Had emotional stressor that occurred yesterday and since daughter has noted patient seems confused, is repeating things, and is not responding appropriately.  Pt has hx of HTN and has not be compliant with medications.  HR in 120's on arrival.  When interviewing patient she does not give complete answers and often stares and smiles.

## 2016-10-22 NOTE — ED Notes (Signed)
Gertie Fey, PA has came to self to report patient/family member want patient to be transferred to [facility]. Awaiting for [place], [place] to reach someone at [facility]

## 2016-10-22 NOTE — ED Notes (Signed)
Attempted US guided IV placement, unsuccessful. Able to get some of the blood ordered. IV team consulted.

## 2016-10-23 ENCOUNTER — Inpatient Hospital Stay (HOSPITAL_COMMUNITY): Payer: Medicare Other

## 2016-10-23 ENCOUNTER — Encounter (HOSPITAL_COMMUNITY): Payer: Self-pay | Admitting: Emergency Medicine

## 2016-10-23 DIAGNOSIS — Z853 Personal history of malignant neoplasm of breast: Secondary | ICD-10-CM

## 2016-10-23 DIAGNOSIS — E876 Hypokalemia: Secondary | ICD-10-CM | POA: Diagnosis present

## 2016-10-23 DIAGNOSIS — G9340 Encephalopathy, unspecified: Secondary | ICD-10-CM | POA: Diagnosis present

## 2016-10-23 DIAGNOSIS — G9389 Other specified disorders of brain: Secondary | ICD-10-CM | POA: Insufficient documentation

## 2016-10-23 DIAGNOSIS — I10 Essential (primary) hypertension: Secondary | ICD-10-CM | POA: Diagnosis present

## 2016-10-23 LAB — COMPREHENSIVE METABOLIC PANEL
ALT: 12 U/L — AB (ref 14–54)
AST: 16 U/L (ref 15–41)
Albumin: 3.8 g/dL (ref 3.5–5.0)
Alkaline Phosphatase: 49 U/L (ref 38–126)
Anion gap: 7 (ref 5–15)
BUN: 9 mg/dL (ref 6–20)
CHLORIDE: 109 mmol/L (ref 101–111)
CO2: 24 mmol/L (ref 22–32)
CREATININE: 0.64 mg/dL (ref 0.44–1.00)
Calcium: 8.8 mg/dL — ABNORMAL LOW (ref 8.9–10.3)
GFR calc Af Amer: >60 mL/min (ref >60)
Glucose, Bld: 118 mg/dL — ABNORMAL HIGH (ref 65–99)
Potassium: 2.6 mmol/L — CL (ref 3.5–5.1)
Sodium: 140 mmol/L (ref 135–145)
Total Bilirubin: 1 mg/dL (ref 0.3–1.2)
Total Protein: 6.6 g/dL (ref 6.5–8.1)

## 2016-10-23 LAB — CBC
HEMATOCRIT: 39.9 % (ref 36.0–46.0)
HEMOGLOBIN: 13.2 g/dL (ref 12.0–15.0)
MCH: 29.4 pg (ref 26.0–34.0)
MCHC: 33.1 g/dL (ref 30.0–36.0)
MCV: 88.9 fL (ref 78.0–100.0)
Platelets: 154 10*3/uL (ref 150–400)
RBC: 4.49 MIL/uL (ref 3.87–5.11)
RDW: 13 % (ref 11.5–15.5)
WBC: 6.6 10*3/uL (ref 4.0–10.5)

## 2016-10-23 LAB — PROTIME-INR
INR: 1.08
Prothrombin Time: 14 seconds (ref 11.4–15.2)

## 2016-10-23 LAB — BASIC METABOLIC PANEL
Anion gap: 14 (ref 5–15)
BUN: 6 mg/dL (ref 6–20)
CO2: 21 mmol/L — ABNORMAL LOW (ref 22–32)
Calcium: 8.7 mg/dL — ABNORMAL LOW (ref 8.9–10.3)
Chloride: 105 mmol/L (ref 101–111)
Creatinine, Ser: 0.71 mg/dL (ref 0.44–1.00)
GFR calc Af Amer: >60 mL/min (ref >60)
GFR calc non Af Amer: >60 mL/min (ref >60)
Glucose, Bld: 156 mg/dL — ABNORMAL HIGH (ref 65–99)
Potassium: 3 mmol/L — ABNORMAL LOW (ref 3.5–5.1)
Sodium: 140 mmol/L (ref 135–145)

## 2016-10-23 LAB — LACTIC ACID, PLASMA
Lactic Acid, Venous: 1.1 mmol/L (ref 0.5–1.9)
Lactic Acid, Venous: 1.2 mmol/L (ref 0.5–1.9)

## 2016-10-23 LAB — MAGNESIUM: MAGNESIUM: 1.8 mg/dL (ref 1.7–2.4)

## 2016-10-23 LAB — APTT: aPTT: 35 seconds (ref 24–36)

## 2016-10-23 LAB — PROCALCITONIN: Procalcitonin: <0.1 ng/mL

## 2016-10-23 MED ORDER — HYDRALAZINE HCL 20 MG/ML IJ SOLN
5.0000 mg | INTRAMUSCULAR | Status: DC | PRN
  Administered 2016-10-29: 5 mg via INTRAVENOUS
  Filled 2016-10-23: qty 1

## 2016-10-23 MED ORDER — MORPHINE SULFATE (PF) 4 MG/ML IV SOLN
2.0000 mg | INTRAVENOUS | Status: DC | PRN
  Administered 2016-10-23: 2 mg via INTRAVENOUS

## 2016-10-23 MED ORDER — DEXAMETHASONE SODIUM PHOSPHATE 4 MG/ML IJ SOLN
4.0000 mg | Freq: Four times a day (QID) | INTRAMUSCULAR | Status: DC
  Administered 2016-10-23 – 2016-10-28 (×22): 4 mg via INTRAVENOUS
  Filled 2016-10-23 (×23): qty 1

## 2016-10-23 MED ORDER — ONDANSETRON HCL 4 MG/2ML IJ SOLN
4.0000 mg | Freq: Four times a day (QID) | INTRAMUSCULAR | Status: DC | PRN
  Administered 2016-10-23: 4 mg via INTRAVENOUS
  Filled 2016-10-23: qty 2

## 2016-10-23 MED ORDER — POTASSIUM CHLORIDE CRYS ER 20 MEQ PO TBCR
40.0000 meq | EXTENDED_RELEASE_TABLET | Freq: Two times a day (BID) | ORAL | Status: AC
  Administered 2016-10-23 (×2): 40 meq via ORAL
  Filled 2016-10-23 (×2): qty 2

## 2016-10-23 MED ORDER — ACETAMINOPHEN 325 MG PO TABS
650.0000 mg | ORAL_TABLET | Freq: Four times a day (QID) | ORAL | Status: DC | PRN
  Administered 2016-10-23 – 2016-10-26 (×2): 650 mg via ORAL
  Filled 2016-10-23 (×2): qty 2

## 2016-10-23 MED ORDER — LACTATED RINGERS IV SOLN
INTRAVENOUS | Status: DC
  Administered 2016-10-23 (×2): via INTRAVENOUS

## 2016-10-23 MED ORDER — METOPROLOL SUCCINATE ER 100 MG PO TB24
100.0000 mg | ORAL_TABLET | Freq: Every evening | ORAL | Status: DC
  Administered 2016-10-23 – 2016-10-30 (×8): 100 mg via ORAL
  Filled 2016-10-23 (×7): qty 1
  Filled 2016-10-23: qty 2

## 2016-10-23 MED ORDER — ONDANSETRON HCL 4 MG PO TABS: 4.0000 mg | ORAL_TABLET | Freq: Four times a day (QID) | ORAL | Status: DC | PRN

## 2016-10-23 MED ORDER — LEVETIRACETAM 500 MG PO TABS
500.0000 mg | ORAL_TABLET | Freq: Two times a day (BID) | ORAL | Status: DC
  Administered 2016-10-23 – 2016-10-26 (×7): 500 mg via ORAL
  Filled 2016-10-23 (×7): qty 1

## 2016-10-23 MED ORDER — IOPAMIDOL (ISOVUE-300) INJECTION 61%
INTRAVENOUS | Status: AC
  Administered 2016-10-23: 30 mL
  Filled 2016-10-23: qty 30

## 2016-10-23 MED ORDER — BENAZEPRIL HCL 20 MG PO TABS
20.0000 mg | ORAL_TABLET | Freq: Every day | ORAL | Status: DC
Start: 2016-10-23 — End: 2016-10-30
  Administered 2016-10-23 – 2016-10-30 (×7): 20 mg via ORAL
  Filled 2016-10-23 (×7): qty 1

## 2016-10-23 MED ORDER — SODIUM CHLORIDE 0.9 % IV SOLN: 30.0000 meq | Freq: Once | INTRAVENOUS | Status: DC

## 2016-10-23 MED ORDER — HYDROCHLOROTHIAZIDE 25 MG PO TABS
25.0000 mg | ORAL_TABLET | Freq: Every day | ORAL | Status: DC
  Administered 2016-10-23 – 2016-10-30 (×7): 25 mg via ORAL
  Filled 2016-10-23 (×7): qty 1

## 2016-10-23 MED ORDER — WHITE PETROLATUM GEL
Status: AC
  Administered 2016-10-23: 07:00:00
  Filled 2016-10-23: qty 1

## 2016-10-23 MED ORDER — POTASSIUM CHLORIDE 10 MEQ/100ML IV SOLN
10.0000 meq | INTRAVENOUS | Status: AC
  Filled 2016-10-23 (×3): qty 100

## 2016-10-23 MED ORDER — MORPHINE SULFATE (PF) 4 MG/ML IV SOLN
INTRAVENOUS | Status: AC
  Filled 2016-10-23: qty 1

## 2016-10-23 MED ORDER — BENAZEPRIL-HYDROCHLOROTHIAZIDE 20-25 MG PO TABS: 1.0000 | ORAL_TABLET | Freq: Every day | ORAL | Status: DC

## 2016-10-23 MED ORDER — ACETAMINOPHEN 650 MG RE SUPP: 650.0000 mg | Freq: Four times a day (QID) | RECTAL | Status: DC | PRN

## 2016-10-23 MED ORDER — ALPRAZOLAM 0.25 MG PO TABS
0.1250 mg | ORAL_TABLET | Freq: Every evening | ORAL | Status: DC | PRN
  Administered 2016-10-24 – 2016-10-26 (×3): 0.25 mg via ORAL
  Filled 2016-10-23 (×4): qty 1

## 2016-10-23 MED ORDER — POTASSIUM CHLORIDE CRYS ER 20 MEQ PO TBCR
40.0000 meq | EXTENDED_RELEASE_TABLET | Freq: Once | ORAL | Status: AC
  Administered 2016-10-23: 40 meq via ORAL
  Filled 2016-10-23 (×2): qty 2

## 2016-10-23 MED ORDER — IOPAMIDOL (ISOVUE-300) INJECTION 61%
INTRAVENOUS | Status: AC
  Administered 2016-10-23: 100 mL
  Filled 2016-10-23: qty 100

## 2016-10-23 MED ORDER — POTASSIUM CHLORIDE 10 MEQ/100ML IV SOLN: 10.0000 meq | INTRAVENOUS | Status: DC

## 2016-10-23 MED ORDER — POTASSIUM CHLORIDE CRYS ER 20 MEQ PO TBCR
20.0000 meq | EXTENDED_RELEASE_TABLET | Freq: Once | ORAL | Status: DC
  Filled 2016-10-23: qty 1

## 2016-10-23 NOTE — Progress Notes (Signed)
[facility] paged for medication clarification. RN will continue to monitor patient.

## 2016-10-23 NOTE — ED Notes (Signed)
Family requested pain medicine before patient is transported if possible.  Paged admitting physician

## 2016-10-23 NOTE — Progress Notes (Signed)
Patient arrived to unit alert and oriented to person, time, and place, disoriented to situation. Patient denies h/a and pain. Patient drowsy. Patient son-in law at bedside. RN will continue to monitor.

## 2016-10-23 NOTE — Progress Notes (Addendum)
[facility] PROGRESS NOTE  REIGHAN HIPOLITO HAL:[id number] DOB: 1945/12/08 DOA: 10/22/2016   PCP: Osborne Casco, MD     Assessment/Plan: Principal Problem:   Acute encephalopathy Active Problems:   Brain abscess   Hx of breast cancer   Essential hypertension   Hypokalemia   Frontal mass of brain   71 y.o. female with medical history significant of remote h/o breast cancer and HTN presenting with AMS. Head CT demonstrated a masslike lesion in the left frontal lobe with surrounding vasogenic edema and localized mass effect.  Oncology was concerned about malignancy but suggested MRI, Decadron, and neurosurgery consultation  Assessment and plan  Acute encephalopathy secondary to intracranial mass  left frontal enhancing lesion, history of breast cancer. Metastatic or primary brain cancer would still be much more likely than abscess or resolving hematoma , as per neurosurgery, Dr Sherley Bounds  She will likely need surgical resection or biopsy to confirm pathologic diagnosis -CT concerning for solitary mass, likely malignancy -MRI is more suggestive of possible brain abscess Started on Decadron, Pan CT- No acute findings in the chest, abdomen or pelvis. Transfer to [facility] initiated by ER-    However, [facility] has no beds and it is important to facilitate her evaluation as quickly as we reasonably can.EDP consulted oncall neurohospitalist at [facility], Dr. Parke Poisson  -No antibiotics for now, while awaiting blood and urine cultures as well as brain biopsy vs. Resection. place PICC line. Oncology  Consulted by EDP ,Dr. Martha Clan who felt malignancy likely due to a solitary lesion    If abscess, ID likely needs to be involved  Family still deciding if they would like to stay here vs transfer to [facility] ,family may decide to stay here if Dr Donald Pore is available to take care of this issue  .  Hypokalemia -K 2.6>3.0, replete -Recheck in AM  Hypertension -Continue Toprol  XL -Hold Benazepril/HCTZ due to hypokalemia -Cover with Hydralazine IV for SBP >180  Hyperglycemia Start patient on SSI  Sinus tachycardia Will place on tele     DVT prophylaxsis SCDs  Code Status:  Full code    Family Communication: Discussed in detail with the patient, all imaging results, lab results explained to the patient   Disposition Plan:   Pending neurosurgery decision      Consultants:  Neurosurgery    Procedures:  None  Antibiotics: Anti-infectives    None         HPI/Subjective: Has a HA , no nausea, vomiting or blurry vision  Objective: Vitals:   10/23/16 0030 10/23/16 0045 10/23/16 0143 10/23/16 0541  BP: (!) 176/116  (!) 160/87 (!) 166/102  Pulse: (!) 111 (!) 102 (!) 107 (!) 108  Resp: (!) 24 (!) 25 20 20   Temp:   98.2 F (36.8 C) 98.1 F (36.7 C)  TempSrc:   Oral Oral  SpO2: 93% 92% 95% 95%  Weight:   74.9 kg (165 lb 2 oz)   Height:   5\' 5"  (1.651 m)     Intake/Output Summary (Last 24 hours) at 10/23/16 0926 Last data filed at 10/23/16 0626  Gross per 24 hour  Intake           1632.5 ml  Output              375 ml  Net           1257.5 ml    Exam:  Examination:  General exam: Appears calm and comfortable  Respiratory system: Clear to auscultation. Respiratory effort normal. Cardiovascular system: S1 & S2 heard, RRR. No JVD, murmurs, rubs, gallops or clicks. No pedal edema. Gastrointestinal system: Abdomen is nondistended, soft and nontender. No organomegaly or masses felt. Normal bowel sounds heard. Central nervous system: Alert and oriented. No focal neurological deficits. Extremities: Symmetric 5 x 5 power. Skin: No rashes, lesions or ulcers Psychiatry: Judgement and insight appear normal. Mood & affect appropriate.     Data Reviewed: I have personally reviewed following labs and imaging studies  Micro Results No results found for this or any previous visit (from the past 240 hour(s)).  Radiology Reports Dg  Chest 2 View  Result Date: 10/22/2016 CLINICAL DATA:  Increased confusion history of brain cancer EXAM: CHEST  2 VIEW COMPARISON:  None. FINDINGS: Surgical clips in the right axilla. There is a oval retrocardiac opacity which presumably represents a large hiatal hernia. Cannot exclude atelectasis or infiltrate at the left base. No pneumothorax. IMPRESSION: 1. Difficult to exclude atelectasis or infiltrate at the left lung base 2. Retrocardiac opacity likely reflects a moderate to large hiatal hernia. Electronically Signed   By: Donavan Foil M.D.   On: 10/22/2016 21:32   Ct Head Wo Contrast  Result Date: 10/22/2016 CLINICAL DATA:  Altered mental status since yesterday. EXAM: CT HEAD WITHOUT CONTRAST TECHNIQUE: Contiguous axial images were obtained from the base of the skull through the vertex without intravenous contrast. COMPARISON:  None. FINDINGS: Brain: Masslike lesion within the peripheral aspects of the left frontal lobe, measuring approximately 2.7 x 2.5 cm, with surrounding vasogenic edema. Associated local mass effect with sulcal effacement and slight rightward midline shift of the anterior falx. No intracranial hemorrhage seen. Vascular: There are chronic calcified atherosclerotic changes of the large vessels at the skull base. No unexpected hyperdense vessel. Skull: Normal. Negative for fracture or focal lesion. Sinuses/Orbits: No acute finding. Other: None. IMPRESSION: 1. Masslike lesion within the left frontal lobe, measuring approximately 2.7 x 2.5 cm, with surrounding vasogenic edema, highly suspicious for neoplastic mass. Ordering physician tells me that patient has a history of breast cancer, therefore, this is a suspected intracranial metastasis. Recommend MRI brain with contrast for further characterization. 2. Local mass effect within the left frontal lobe with an associated mild rightward midline shift of the anterior falx. 3. No intracranial hemorrhage seen. These results were called by  telephone at the time of interpretation on 10/22/2016 at 4:55 pm to Dr. Domenic Moras , who verbally acknowledged these results. Electronically Signed   By: Franki Cabot M.D.   On: 10/22/2016 16:59   Ct Chest W Contrast  Result Date: 10/23/2016 CLINICAL DATA:  Sepsis. Pt denies chest pain or sob. Pt denies abd pain or complaints. EXAM: CT CHEST, ABDOMEN, AND PELVIS WITH CONTRAST TECHNIQUE: Multidetector CT imaging of the chest, abdomen and pelvis was performed following the standard protocol during bolus administration of intravenous contrast. CONTRAST:  174mL ISOVUE-300 IOPAMIDOL (ISOVUE-300) INJECTION 61% COMPARISON:  None. FINDINGS: CT CHEST FINDINGS Cardiovascular: Heart is normal size. Aortic calcifications. No aneurysm. Scattered coronary artery calcifications. Mediastinum/Nodes: No mediastinal, hilar, or axillary adenopathy. Large hiatal hernia containing much of the stomach. Trachea is unremarkable. Thyroid and thoracic inlet unremarkable. Lungs/Pleura: Linear platelike scarring or atelectasis in the lower lobes adjacent to the large hiatal hernia. No effusions. No suspicious nodules. Musculoskeletal: No acute bony abnormality. CT ABDOMEN PELVIS FINDINGS Hepatobiliary: Large central cyst in the liver measuring 12.5 x 11 cm. Other smaller scattered cysts. Gallbladder grossly unremarkable. Pancreas: No focal  abnormality or ductal dilatation. Spleen: No focal abnormality.  Normal size. Adrenals/Urinary Tract: Small renal cysts bilaterally. No hydronephrosis or suspicious mass. Adrenal glands and urinary bladder unremarkable. Stomach/Bowel: Large and small bowel decompressed, unremarkable. Vascular/Lymphatic: No evidence of aneurysm or adenopathy. Diffuse aortic and iliac calcifications. Reproductive: Prior hysterectomy.  No adnexal masses. Other: No free fluid or free air. Musculoskeletal: No acute bony abnormality. Hemangioma in the L1 vertebral body. IMPRESSION: No acute findings in the chest, abdomen or  pelvis. Large hiatal hernia. Compressive atelectasis or scarring in the lower lobes bilaterally. Aortic atherosclerosis, scattered coronary artery disease. Hepatic and renal cysts. Electronically Signed   By: Rolm Baptise M.D.   On: 10/23/2016 08:42   Mr Jeri Cos NL Contrast  Result Date: 10/22/2016 CLINICAL DATA:  Altered mental status beginning yesterday. Increased confusion. Abnormal CT scan frontal brain mass. EXAM: MRI HEAD WITHOUT AND WITH CONTRAST TECHNIQUE: Multiplanar, multiecho pulse sequences of the brain and surrounding structures were obtained without and with intravenous contrast. CONTRAST:  15 mL MultiHance COMPARISON:  CT head without contrast from the same day. FINDINGS: Brain: A peripherally enhancing left frontal lesion demonstrates a somewhat irregular thickened periphery. There is some T1 shortening along the rim of the lesion as well. The central contents demonstrate restricted diffusion. There is marked surrounding edema results in mass effect and effacement of the adjacent sulci. Minimal midline shift is noted. The enhancement involves the adjacent aura extends along the anterior left frontal convexity. No other focal areas of restricted diffusion or enhancement are present. There are is no acute hemorrhage or other mass lesion. No acute infarct is present. The internal auditory canals are within normal limits bilaterally. The brainstem and cerebellum are normal. Vascular: Flow is present in the major intracranial arteries. Skull and upper cervical spine: The skullbase is within normal limits. Midline sagittal structures are unremarkable. The craniocervical junction is normal. Marrow signal is normal. The upper cervical spine is within normal limits. Sinuses/Orbits: Mild mucosal thickening is present within the ethmoid air cells bilaterally. The remaining paranasal sinuses and mastoid air cells are clear. IMPRESSION: 1. Irregular peripherally enhancing mass lesion in the anterior left  frontal lobe measuring at 2.6 x 3.0 x 2.0 cm with associated enhancement of the adjacent dura and central restricted diffusion is most consistent with an abscess. Neoplasm is considered much less likely. 2. No other discrete lesions. 3. Minimal ethmoid sinus disease. These results were called by telephone at the time of interpretation on 10/22/2016 at 8:33 pm to [facility] PA , who verbally acknowledged these results. Electronically Signed   By: San Morelle M.D.   On: 10/22/2016 20:37   Ct Abdomen Pelvis W Contrast  Result Date: 10/23/2016 CLINICAL DATA:  Sepsis. Pt denies chest pain or sob. Pt denies abd pain or complaints. EXAM: CT CHEST, ABDOMEN, AND PELVIS WITH CONTRAST TECHNIQUE: Multidetector CT imaging of the chest, abdomen and pelvis was performed following the standard protocol during bolus administration of intravenous contrast. CONTRAST:  18mL ISOVUE-300 IOPAMIDOL (ISOVUE-300) INJECTION 61% COMPARISON:  None. FINDINGS: CT CHEST FINDINGS Cardiovascular: Heart is normal size. Aortic calcifications. No aneurysm. Scattered coronary artery calcifications. Mediastinum/Nodes: No mediastinal, hilar, or axillary adenopathy. Large hiatal hernia containing much of the stomach. Trachea is unremarkable. Thyroid and thoracic inlet unremarkable. Lungs/Pleura: Linear platelike scarring or atelectasis in the lower lobes adjacent to the large hiatal hernia. No effusions. No suspicious nodules. Musculoskeletal: No acute bony abnormality. CT ABDOMEN PELVIS FINDINGS Hepatobiliary: Large central cyst in the liver measuring 12.5 x  11 cm. Other smaller scattered cysts. Gallbladder grossly unremarkable. Pancreas: No focal abnormality or ductal dilatation. Spleen: No focal abnormality.  Normal size. Adrenals/Urinary Tract: Small renal cysts bilaterally. No hydronephrosis or suspicious mass. Adrenal glands and urinary bladder unremarkable. Stomach/Bowel: Large and small bowel decompressed, unremarkable.  Vascular/Lymphatic: No evidence of aneurysm or adenopathy. Diffuse aortic and iliac calcifications. Reproductive: Prior hysterectomy.  No adnexal masses. Other: No free fluid or free air. Musculoskeletal: No acute bony abnormality. Hemangioma in the L1 vertebral body. IMPRESSION: No acute findings in the chest, abdomen or pelvis. Large hiatal hernia. Compressive atelectasis or scarring in the lower lobes bilaterally. Aortic atherosclerosis, scattered coronary artery disease. Hepatic and renal cysts. Electronically Signed   By: Rolm Baptise M.D.   On: 10/23/2016 08:42     CBC  Recent Labs Lab 10/22/16 1835 10/22/16 2111 10/23/16 0248  WBC  --  6.8 6.6  HGB 13.3 12.0 13.2  HCT 39.0 35.4* 39.9  PLT  --  149* 154  MCV  --  85.7 88.9  MCH  --  29.1 29.4  MCHC  --  33.9 33.1  RDW  --  12.9 13.0  LYMPHSABS  --  1.4  --   MONOABS  --  0.7  --   EOSABS  --  0.0  --   BASOSABS  --  0.0  --     Chemistries   Recent Labs Lab 10/22/16 1835 10/22/16 2111 10/23/16 0248  NA 141 140 140  K 3.4* 2.6* 3.0*  CL 107 109 105  CO2  --  24 21*  GLUCOSE 99 118* 156*  BUN 11 9 6   CREATININE 0.70 0.64 0.71  CALCIUM  --  8.8* 8.7*  AST  --  16  --   ALT  --  12*  --   ALKPHOS  --  49  --   BILITOT  --  1.0  --    ------------------------------------------------------------------------------------------------------------------ estimated creatinine clearance is 65.4 mL/min (by C-G formula based on SCr of 0.71 mg/dL). ------------------------------------------------------------------------------------------------------------------ No results for input(s): HGBA1C in the last 72 hours. ------------------------------------------------------------------------------------------------------------------ No results for input(s): CHOL, HDL, LDLCALC, TRIG, CHOLHDL, LDLDIRECT in the last 72  hours. ------------------------------------------------------------------------------------------------------------------  Recent Labs  10/22/16 2111  TSH 1.208   ------------------------------------------------------------------------------------------------------------------ No results for input(s): VITAMINB12, FOLATE, FERRITIN, TIBC, IRON, RETICCTPCT in the last 72 hours.  Coagulation profile  Recent Labs Lab 10/23/16 0248  INR 1.08    No results for input(s): DDIMER in the last 72 hours.  Cardiac Enzymes No results for input(s): CKMB, TROPONINI, MYOGLOBIN in the last 168 hours.  Invalid input(s): CK ------------------------------------------------------------------------------------------------------------------ Invalid input(s): POCBNP   CBG:  Recent Labs Lab 10/22/16 1707  GLUCAP 104*       Studies: Dg Chest 2 View  Result Date: 10/22/2016 CLINICAL DATA:  Increased confusion history of brain cancer EXAM: CHEST  2 VIEW COMPARISON:  None. FINDINGS: Surgical clips in the right axilla. There is a oval retrocardiac opacity which presumably represents a large hiatal hernia. Cannot exclude atelectasis or infiltrate at the left base. No pneumothorax. IMPRESSION: 1. Difficult to exclude atelectasis or infiltrate at the left lung base 2. Retrocardiac opacity likely reflects a moderate to large hiatal hernia. Electronically Signed   By: Donavan Foil M.D.   On: 10/22/2016 21:32   Ct Head Wo Contrast  Result Date: 10/22/2016 CLINICAL DATA:  Altered mental status since yesterday. EXAM: CT HEAD WITHOUT CONTRAST TECHNIQUE: Contiguous axial images were obtained from the base of the skull through  the vertex without intravenous contrast. COMPARISON:  None. FINDINGS: Brain: Masslike lesion within the peripheral aspects of the left frontal lobe, measuring approximately 2.7 x 2.5 cm, with surrounding vasogenic edema. Associated local mass effect with sulcal effacement and slight  rightward midline shift of the anterior falx. No intracranial hemorrhage seen. Vascular: There are chronic calcified atherosclerotic changes of the large vessels at the skull base. No unexpected hyperdense vessel. Skull: Normal. Negative for fracture or focal lesion. Sinuses/Orbits: No acute finding. Other: None. IMPRESSION: 1. Masslike lesion within the left frontal lobe, measuring approximately 2.7 x 2.5 cm, with surrounding vasogenic edema, highly suspicious for neoplastic mass. Ordering physician tells me that patient has a history of breast cancer, therefore, this is a suspected intracranial metastasis. Recommend MRI brain with contrast for further characterization. 2. Local mass effect within the left frontal lobe with an associated mild rightward midline shift of the anterior falx. 3. No intracranial hemorrhage seen. These results were called by telephone at the time of interpretation on 10/22/2016 at 4:55 pm to Dr. Domenic Moras , who verbally acknowledged these results. Electronically Signed   By: Franki Cabot M.D.   On: 10/22/2016 16:59   Ct Chest W Contrast  Result Date: 10/23/2016 CLINICAL DATA:  Sepsis. Pt denies chest pain or sob. Pt denies abd pain or complaints. EXAM: CT CHEST, ABDOMEN, AND PELVIS WITH CONTRAST TECHNIQUE: Multidetector CT imaging of the chest, abdomen and pelvis was performed following the standard protocol during bolus administration of intravenous contrast. CONTRAST:  129mL ISOVUE-300 IOPAMIDOL (ISOVUE-300) INJECTION 61% COMPARISON:  None. FINDINGS: CT CHEST FINDINGS Cardiovascular: Heart is normal size. Aortic calcifications. No aneurysm. Scattered coronary artery calcifications. Mediastinum/Nodes: No mediastinal, hilar, or axillary adenopathy. Large hiatal hernia containing much of the stomach. Trachea is unremarkable. Thyroid and thoracic inlet unremarkable. Lungs/Pleura: Linear platelike scarring or atelectasis in the lower lobes adjacent to the large hiatal hernia. No  effusions. No suspicious nodules. Musculoskeletal: No acute bony abnormality. CT ABDOMEN PELVIS FINDINGS Hepatobiliary: Large central cyst in the liver measuring 12.5 x 11 cm. Other smaller scattered cysts. Gallbladder grossly unremarkable. Pancreas: No focal abnormality or ductal dilatation. Spleen: No focal abnormality.  Normal size. Adrenals/Urinary Tract: Small renal cysts bilaterally. No hydronephrosis or suspicious mass. Adrenal glands and urinary bladder unremarkable. Stomach/Bowel: Large and small bowel decompressed, unremarkable. Vascular/Lymphatic: No evidence of aneurysm or adenopathy. Diffuse aortic and iliac calcifications. Reproductive: Prior hysterectomy.  No adnexal masses. Other: No free fluid or free air. Musculoskeletal: No acute bony abnormality. Hemangioma in the L1 vertebral body. IMPRESSION: No acute findings in the chest, abdomen or pelvis. Large hiatal hernia. Compressive atelectasis or scarring in the lower lobes bilaterally. Aortic atherosclerosis, scattered coronary artery disease. Hepatic and renal cysts. Electronically Signed   By: Rolm Baptise M.D.   On: 10/23/2016 08:42   Mr Jeri Cos PX Contrast  Result Date: 10/22/2016 CLINICAL DATA:  Altered mental status beginning yesterday. Increased confusion. Abnormal CT scan frontal brain mass. EXAM: MRI HEAD WITHOUT AND WITH CONTRAST TECHNIQUE: Multiplanar, multiecho pulse sequences of the brain and surrounding structures were obtained without and with intravenous contrast. CONTRAST:  15 mL MultiHance COMPARISON:  CT head without contrast from the same day. FINDINGS: Brain: A peripherally enhancing left frontal lesion demonstrates a somewhat irregular thickened periphery. There is some T1 shortening along the rim of the lesion as well. The central contents demonstrate restricted diffusion. There is marked surrounding edema results in mass effect and effacement of the adjacent sulci. Minimal midline shift is noted. The  enhancement involves  the adjacent aura extends along the anterior left frontal convexity. No other focal areas of restricted diffusion or enhancement are present. There are is no acute hemorrhage or other mass lesion. No acute infarct is present. The internal auditory canals are within normal limits bilaterally. The brainstem and cerebellum are normal. Vascular: Flow is present in the major intracranial arteries. Skull and upper cervical spine: The skullbase is within normal limits. Midline sagittal structures are unremarkable. The craniocervical junction is normal. Marrow signal is normal. The upper cervical spine is within normal limits. Sinuses/Orbits: Mild mucosal thickening is present within the ethmoid air cells bilaterally. The remaining paranasal sinuses and mastoid air cells are clear. IMPRESSION: 1. Irregular peripherally enhancing mass lesion in the anterior left frontal lobe measuring at 2.6 x 3.0 x 2.0 cm with associated enhancement of the adjacent dura and central restricted diffusion is most consistent with an abscess. Neoplasm is considered much less likely. 2. No other discrete lesions. 3. Minimal ethmoid sinus disease. These results were called by telephone at the time of interpretation on 10/22/2016 at 8:33 pm to [facility] PA , who verbally acknowledged these results. Electronically Signed   By: San Morelle M.D.   On: 10/22/2016 20:37   Ct Abdomen Pelvis W Contrast  Result Date: 10/23/2016 CLINICAL DATA:  Sepsis. Pt denies chest pain or sob. Pt denies abd pain or complaints. EXAM: CT CHEST, ABDOMEN, AND PELVIS WITH CONTRAST TECHNIQUE: Multidetector CT imaging of the chest, abdomen and pelvis was performed following the standard protocol during bolus administration of intravenous contrast. CONTRAST:  182mL ISOVUE-300 IOPAMIDOL (ISOVUE-300) INJECTION 61% COMPARISON:  None. FINDINGS: CT CHEST FINDINGS Cardiovascular: Heart is normal size. Aortic calcifications. No aneurysm. Scattered coronary artery  calcifications. Mediastinum/Nodes: No mediastinal, hilar, or axillary adenopathy. Large hiatal hernia containing much of the stomach. Trachea is unremarkable. Thyroid and thoracic inlet unremarkable. Lungs/Pleura: Linear platelike scarring or atelectasis in the lower lobes adjacent to the large hiatal hernia. No effusions. No suspicious nodules. Musculoskeletal: No acute bony abnormality. CT ABDOMEN PELVIS FINDINGS Hepatobiliary: Large central cyst in the liver measuring 12.5 x 11 cm. Other smaller scattered cysts. Gallbladder grossly unremarkable. Pancreas: No focal abnormality or ductal dilatation. Spleen: No focal abnormality.  Normal size. Adrenals/Urinary Tract: Small renal cysts bilaterally. No hydronephrosis or suspicious mass. Adrenal glands and urinary bladder unremarkable. Stomach/Bowel: Large and small bowel decompressed, unremarkable. Vascular/Lymphatic: No evidence of aneurysm or adenopathy. Diffuse aortic and iliac calcifications. Reproductive: Prior hysterectomy.  No adnexal masses. Other: No free fluid or free air. Musculoskeletal: No acute bony abnormality. Hemangioma in the L1 vertebral body. IMPRESSION: No acute findings in the chest, abdomen or pelvis. Large hiatal hernia. Compressive atelectasis or scarring in the lower lobes bilaterally. Aortic atherosclerosis, scattered coronary artery disease. Hepatic and renal cysts. Electronically Signed   By: Rolm Baptise M.D.   On: 10/23/2016 08:42      No results found for: HGBA1C Lab Results  Component Value Date   CREATININE 0.71 10/23/2016       Scheduled Meds: . benazepril  20 mg Oral Daily   And  . hydrochlorothiazide  25 mg Oral Daily  . dexamethasone  4 mg Intravenous Q6H  . metoprolol succinate  100 mg Oral QPM  . morphine      . potassium chloride  20 mEq Oral Once   Continuous Infusions: . lactated ringers 75 mL/hr at 10/23/16 0327     LOS: 1 day    Time spent: >30 MINS  [facility] Pager  [phone]. If 7PM-7AM, please contact night-coverage at [email or web address], password [facility] 10/23/2016, 9:26 AM  LOS: 1 day

## 2016-10-23 NOTE — Progress Notes (Signed)
[facility] transfer center called RN and request that RN call back within 15 minutes to transfer pt or bed become unavailable. RN consulted attending MD and verbalized to patient's son that [facility] has a bed. Pt's son stated that he needs to call his sister before making decision to transfer. Dr. Allyson Sabal phone number at [phone] if staffs need to call after shift change if needed. Per Dr. Ronnald Ramp, he already consulted Dr. Vertell Limber (per family request).  Ave Filter, RN

## 2016-10-23 NOTE — Progress Notes (Signed)
RN paged neurosurgeon per family request. Dr. Ronnald Ramp at bedside with patient and family.   Ave Filter, RN

## 2016-10-23 NOTE — Progress Notes (Signed)
MD paged with potassium level 3.0. RN will continue to monitor.

## 2016-10-23 NOTE — Progress Notes (Signed)
Subjective: Patient reports still confused with expressive aphasia.  Objective: Vital signs in last 24 hours: Temp:  [98.1 F (36.7 C)-98.9 F (37.2 C)] 98.1 F (36.7 C) (03/22 1809) Pulse Rate:  [92-114] 92 (03/22 1809) Resp:  [16-25] 18 (03/22 1809) BP: (136-176)/(82-132) 145/82 (03/22 1809) SpO2:  [92 %-96 %] 93 % (03/22 1809) Weight:  [74.9 kg (165 lb 2 oz)] 74.9 kg (165 lb 2 oz) (03/22 0143)  Intake/Output from previous day: 03/21 0701 - 03/22 0700 In: 1632.5 [P.O.:480; I.V.:152.5; IV Piggyback:1000] Out: 375 [Urine:375] Intake/Output this shift: No intake/output data recorded.  Physical Exam: Awake, alert, states name, age, not able to relate more complex issues, but seems to understand what is going on.  Exam is consistent with expressive aphasia.  No weakness or drift.  Patient denies headache.  Lab Results:  Recent Labs  10/22/16 2111 10/23/16 0248  WBC 6.8 6.6  HGB 12.0 13.2  HCT 35.4* 39.9  PLT 149* 154   BMET  Recent Labs  10/22/16 2111 10/23/16 0248  NA 140 140  K 2.6* 3.0*  CL 109 105  CO2 24 21*  GLUCOSE 118* 156*  BUN 9 6  CREATININE 0.64 0.71  CALCIUM 8.8* 8.7*    Studies/Results: Dg Chest 2 View  Result Date: 10/22/2016 CLINICAL DATA:  Increased confusion history of brain cancer EXAM: CHEST  2 VIEW COMPARISON:  None. FINDINGS: Surgical clips in the right axilla. There is a oval retrocardiac opacity which presumably represents a large hiatal hernia. Cannot exclude atelectasis or infiltrate at the left base. No pneumothorax. IMPRESSION: 1. Difficult to exclude atelectasis or infiltrate at the left lung base 2. Retrocardiac opacity likely reflects a moderate to large hiatal hernia. Electronically Signed   By: Donavan Foil M.D.   On: 10/22/2016 21:32   Ct Head Wo Contrast  Result Date: 10/22/2016 CLINICAL DATA:  Altered mental status since yesterday. EXAM: CT HEAD WITHOUT CONTRAST TECHNIQUE: Contiguous axial images were obtained from the base  of the skull through the vertex without intravenous contrast. COMPARISON:  None. FINDINGS: Brain: Masslike lesion within the peripheral aspects of the left frontal lobe, measuring approximately 2.7 x 2.5 cm, with surrounding vasogenic edema. Associated local mass effect with sulcal effacement and slight rightward midline shift of the anterior falx. No intracranial hemorrhage seen. Vascular: There are chronic calcified atherosclerotic changes of the large vessels at the skull base. No unexpected hyperdense vessel. Skull: Normal. Negative for fracture or focal lesion. Sinuses/Orbits: No acute finding. Other: None. IMPRESSION: 1. Masslike lesion within the left frontal lobe, measuring approximately 2.7 x 2.5 cm, with surrounding vasogenic edema, highly suspicious for neoplastic mass. Ordering physician tells me that patient has a history of breast cancer, therefore, this is a suspected intracranial metastasis. Recommend MRI brain with contrast for further characterization. 2. Local mass effect within the left frontal lobe with an associated mild rightward midline shift of the anterior falx. 3. No intracranial hemorrhage seen. These results were called by telephone at the time of interpretation on 10/22/2016 at 4:55 pm to Dr. Domenic Moras , who verbally acknowledged these results. Electronically Signed   By: Franki Cabot M.D.   On: 10/22/2016 16:59   Ct Chest W Contrast  Result Date: 10/23/2016 CLINICAL DATA:  Sepsis. Pt denies chest pain or sob. Pt denies abd pain or complaints. EXAM: CT CHEST, ABDOMEN, AND PELVIS WITH CONTRAST TECHNIQUE: Multidetector CT imaging of the chest, abdomen and pelvis was performed following the standard protocol during bolus administration of intravenous contrast.  CONTRAST:  164mL ISOVUE-300 IOPAMIDOL (ISOVUE-300) INJECTION 61% COMPARISON:  None. FINDINGS: CT CHEST FINDINGS Cardiovascular: Heart is normal size. Aortic calcifications. No aneurysm. Scattered coronary artery calcifications.  Mediastinum/Nodes: No mediastinal, hilar, or axillary adenopathy. Large hiatal hernia containing much of the stomach. Trachea is unremarkable. Thyroid and thoracic inlet unremarkable. Lungs/Pleura: Linear platelike scarring or atelectasis in the lower lobes adjacent to the large hiatal hernia. No effusions. No suspicious nodules. Musculoskeletal: No acute bony abnormality. CT ABDOMEN PELVIS FINDINGS Hepatobiliary: Large central cyst in the liver measuring 12.5 x 11 cm. Other smaller scattered cysts. Gallbladder grossly unremarkable. Pancreas: No focal abnormality or ductal dilatation. Spleen: No focal abnormality.  Normal size. Adrenals/Urinary Tract: Small renal cysts bilaterally. No hydronephrosis or suspicious mass. Adrenal glands and urinary bladder unremarkable. Stomach/Bowel: Large and small bowel decompressed, unremarkable. Vascular/Lymphatic: No evidence of aneurysm or adenopathy. Diffuse aortic and iliac calcifications. Reproductive: Prior hysterectomy.  No adnexal masses. Other: No free fluid or free air. Musculoskeletal: No acute bony abnormality. Hemangioma in the L1 vertebral body. IMPRESSION: No acute findings in the chest, abdomen or pelvis. Large hiatal hernia. Compressive atelectasis or scarring in the lower lobes bilaterally. Aortic atherosclerosis, scattered coronary artery disease. Hepatic and renal cysts. Electronically Signed   By: Rolm Baptise M.D.   On: 10/23/2016 08:42   Mr Jeri Cos VO Contrast  Result Date: 10/22/2016 CLINICAL DATA:  Altered mental status beginning yesterday. Increased confusion. Abnormal CT scan frontal brain mass. EXAM: MRI HEAD WITHOUT AND WITH CONTRAST TECHNIQUE: Multiplanar, multiecho pulse sequences of the brain and surrounding structures were obtained without and with intravenous contrast. CONTRAST:  15 mL MultiHance COMPARISON:  CT head without contrast from the same day. FINDINGS: Brain: A peripherally enhancing left frontal lesion demonstrates a somewhat  irregular thickened periphery. There is some T1 shortening along the rim of the lesion as well. The central contents demonstrate restricted diffusion. There is marked surrounding edema results in mass effect and effacement of the adjacent sulci. Minimal midline shift is noted. The enhancement involves the adjacent aura extends along the anterior left frontal convexity. No other focal areas of restricted diffusion or enhancement are present. There are is no acute hemorrhage or other mass lesion. No acute infarct is present. The internal auditory canals are within normal limits bilaterally. The brainstem and cerebellum are normal. Vascular: Flow is present in the major intracranial arteries. Skull and upper cervical spine: The skullbase is within normal limits. Midline sagittal structures are unremarkable. The craniocervical junction is normal. Marrow signal is normal. The upper cervical spine is within normal limits. Sinuses/Orbits: Mild mucosal thickening is present within the ethmoid air cells bilaterally. The remaining paranasal sinuses and mastoid air cells are clear. IMPRESSION: 1. Irregular peripherally enhancing mass lesion in the anterior left frontal lobe measuring at 2.6 x 3.0 x 2.0 cm with associated enhancement of the adjacent dura and central restricted diffusion is most consistent with an abscess. Neoplasm is considered much less likely. 2. No other discrete lesions. 3. Minimal ethmoid sinus disease. These results were called by telephone at the time of interpretation on 10/22/2016 at 8:33 pm to [facility] , who verbally acknowledged these results. Electronically Signed   By: San Morelle M.D.   On: 10/22/2016 20:37   Ct Abdomen Pelvis W Contrast  Result Date: 10/23/2016 CLINICAL DATA:  Sepsis. Pt denies chest pain or sob. Pt denies abd pain or complaints. EXAM: CT CHEST, ABDOMEN, AND PELVIS WITH CONTRAST TECHNIQUE: Multidetector CT imaging of the chest, abdomen and pelvis  was performed  following the standard protocol during bolus administration of intravenous contrast. CONTRAST:  14mL ISOVUE-300 IOPAMIDOL (ISOVUE-300) INJECTION 61% COMPARISON:  None. FINDINGS: CT CHEST FINDINGS Cardiovascular: Heart is normal size. Aortic calcifications. No aneurysm. Scattered coronary artery calcifications. Mediastinum/Nodes: No mediastinal, hilar, or axillary adenopathy. Large hiatal hernia containing much of the stomach. Trachea is unremarkable. Thyroid and thoracic inlet unremarkable. Lungs/Pleura: Linear platelike scarring or atelectasis in the lower lobes adjacent to the large hiatal hernia. No effusions. No suspicious nodules. Musculoskeletal: No acute bony abnormality. CT ABDOMEN PELVIS FINDINGS Hepatobiliary: Large central cyst in the liver measuring 12.5 x 11 cm. Other smaller scattered cysts. Gallbladder grossly unremarkable. Pancreas: No focal abnormality or ductal dilatation. Spleen: No focal abnormality.  Normal size. Adrenals/Urinary Tract: Small renal cysts bilaterally. No hydronephrosis or suspicious mass. Adrenal glands and urinary bladder unremarkable. Stomach/Bowel: Large and small bowel decompressed, unremarkable. Vascular/Lymphatic: No evidence of aneurysm or adenopathy. Diffuse aortic and iliac calcifications. Reproductive: Prior hysterectomy.  No adnexal masses. Other: No free fluid or free air. Musculoskeletal: No acute bony abnormality. Hemangioma in the L1 vertebral body. IMPRESSION: No acute findings in the chest, abdomen or pelvis. Large hiatal hernia. Compressive atelectasis or scarring in the lower lobes bilaterally. Aortic atherosclerosis, scattered coronary artery disease. Hepatic and renal cysts. Electronically Signed   By: Rolm Baptise M.D.   On: 10/23/2016 08:42    Assessment/Plan: Patient has left frontal ring-enhancing mass.  She has undergone a metastatic workup which has, to date, been negative.  Patient has improved with decadron.  The radiologist interpreted her brain  MRI as most consistent with abscess, but her WBC is normal (6.8) and she has not had a fever.  I am concerned that this may instead represent a primary brain tumor.  I will start her on keppra, continue decadron, proceed with Brainlab MRI of brain for surgical planning and proceed with surgery early next week.  Patient and her children are agreeable with this plan and have decided to stay locally for treatment rather than to be tranfserred to [facility].    LOS: 1 day    Peggyann Shoals, MD 10/23/2016, 8:23 PM

## 2016-10-23 NOTE — ED Notes (Signed)
[facility] called for transport. 

## 2016-10-23 NOTE — Progress Notes (Signed)
Patient family request Dr. Vertell Limber to be the neurosurgeon If surgery consulted.  Ave Filter, RN

## 2016-10-23 NOTE — Progress Notes (Signed)
Pt's son asked RN to call [facility] back and cancel bed at this time. He also wanted RN to paged Dr. Vertell Limber. MD office called and answering service will paged MD.   RN called [facility] transfer [facility] but staff at [facility] wants MD to call. Message left with Dr. Allyson Sabal to call [facility]. Patient's son spoke with Dr. Allyson Sabal at this time.   [place] number [phone]   [facility], RN

## 2016-10-23 NOTE — Progress Notes (Signed)
Patient able to tolerated 1st bottle of contrast but not able to complete second bottle. Per CT staff, ok not to drink at this time. 1st bottle is ok.   Ave Filter, RN

## 2016-10-23 NOTE — Progress Notes (Signed)
STAT BMP per [facility], IV potassium held until K level obtained. RN will continue to monitor.

## 2016-10-23 NOTE — Progress Notes (Signed)
2/2 16 oz bottle of Isovue started. RN will continue to monitor.

## 2016-10-24 ENCOUNTER — Encounter (HOSPITAL_COMMUNITY): Payer: Self-pay | Admitting: Radiology

## 2016-10-24 ENCOUNTER — Inpatient Hospital Stay (HOSPITAL_COMMUNITY): Payer: Medicare Other

## 2016-10-24 DIAGNOSIS — I10 Essential (primary) hypertension: Secondary | ICD-10-CM

## 2016-10-24 DIAGNOSIS — G939 Disorder of brain, unspecified: Secondary | ICD-10-CM

## 2016-10-24 DIAGNOSIS — E876 Hypokalemia: Secondary | ICD-10-CM

## 2016-10-24 LAB — GLUCOSE, CAPILLARY
GLUCOSE-CAPILLARY: 145 mg/dL — AB (ref 65–99)
GLUCOSE-CAPILLARY: 179 mg/dL — AB (ref 65–99)

## 2016-10-24 LAB — COMPREHENSIVE METABOLIC PANEL
ALT: 13 U/L — ABNORMAL LOW (ref 14–54)
ANION GAP: 10 (ref 5–15)
AST: 20 U/L (ref 15–41)
Albumin: 3.1 g/dL — ABNORMAL LOW (ref 3.5–5.0)
Alkaline Phosphatase: 49 U/L (ref 38–126)
BUN: 12 mg/dL (ref 6–20)
CHLORIDE: 104 mmol/L (ref 101–111)
CO2: 25 mmol/L (ref 22–32)
Calcium: 9.3 mg/dL (ref 8.9–10.3)
Creatinine, Ser: 0.74 mg/dL (ref 0.44–1.00)
Glucose, Bld: 166 mg/dL — ABNORMAL HIGH (ref 65–99)
POTASSIUM: 4.2 mmol/L (ref 3.5–5.1)
Sodium: 139 mmol/L (ref 135–145)
Total Bilirubin: 0.7 mg/dL (ref 0.3–1.2)
Total Protein: 6.3 g/dL — ABNORMAL LOW (ref 6.5–8.1)

## 2016-10-24 LAB — CBC
HEMATOCRIT: 37.5 % (ref 36.0–46.0)
HEMOGLOBIN: 12.4 g/dL (ref 12.0–15.0)
MCH: 29.2 pg (ref 26.0–34.0)
MCHC: 33.1 g/dL (ref 30.0–36.0)
MCV: 88.4 fL (ref 78.0–100.0)
Platelets: 169 10*3/uL (ref 150–400)
RBC: 4.24 MIL/uL (ref 3.87–5.11)
RDW: 13 % (ref 11.5–15.5)
WBC: 11.3 10*3/uL — ABNORMAL HIGH (ref 4.0–10.5)

## 2016-10-24 MED ORDER — INSULIN ASPART 100 UNIT/ML ~~LOC~~ SOLN
0.0000 [IU] | Freq: Three times a day (TID) | SUBCUTANEOUS | Status: DC
  Administered 2016-10-24 – 2016-10-26 (×7): 1 [IU] via SUBCUTANEOUS
  Administered 2016-10-27: 3 [IU] via SUBCUTANEOUS
  Administered 2016-10-27: 2 [IU] via SUBCUTANEOUS
  Administered 2016-10-28: 1 [IU] via SUBCUTANEOUS
  Administered 2016-10-28: 2 [IU] via SUBCUTANEOUS
  Administered 2016-10-28 – 2016-10-29 (×3): 1 [IU] via SUBCUTANEOUS
  Administered 2016-10-29: 2 [IU] via SUBCUTANEOUS
  Administered 2016-10-30 (×2): 1 [IU] via SUBCUTANEOUS

## 2016-10-24 MED ORDER — GADOBENATE DIMEGLUMINE 529 MG/ML IV SOLN
16.0000 mL | Freq: Once | INTRAVENOUS | Status: AC | PRN
  Administered 2016-10-24: 16 mL via INTRAVENOUS

## 2016-10-24 NOTE — Care Management Note (Signed)
Case Management Note  Patient Details  Name: Alexandria [name] MRN: [id number] Date of Birth: 04/29/1946  Subjective/Objective:   Pt admitted with acute encephalopathy. She is from home alone.                  Action/Plan: Plan is for OR on Monday for brain lesion. CM following for d/c needs post surgery.   Expected Discharge Date:                  Expected Discharge Plan:     In-House Referral:     Discharge planning Services     Post Acute Care Choice:    Choice offered to:     DME Arranged:    DME Agency:     HH Arranged:    HH Agency:     Status of Service:  In process, will continue to follow  If discussed at Long Length of Stay Meetings, dates discussed:    Additional Comments:  Pollie Friar, RN 10/24/2016, 1:09 PM

## 2016-10-24 NOTE — Progress Notes (Signed)
Patient ID: Alexandria [name], female   DOB: 1946-02-23, 71 y.o.   MRN: [id number] Updated plan.Marland KitchenMarland KitchenBrainLab MRI today, Left frontal craniotomy for tumor Monday morning. NPO after midnight Sunday (0001 10/26/16). Permit for craniotomy. Orders entered.

## 2016-10-24 NOTE — Evaluation (Signed)
Physical Therapy Evaluation Patient Details Name: Alexandria [name] MRN: [id number] DOB: [birth year] Today's Date: 10/24/2016   History of Present Illness  Pt is a 71 y.o. female presented to ED with AMS and acute encephalopathy.  MRI revealed L frontal mass.  Craniotomy with mass excision  planned for 10/27/16. PMH includes CA 1996, HTN.  Past prodecures include R mastectomy and hysterectomy.  Clinical Impression  Patient evaluated by Physical Therapy with no further acute PT needs identified. All education has been completed and the patient has no further questions. At the time of PT eval pt with obvious expressive difficulties, but minimal mobility deficits noted. At this time, pt does not require acute PT services, but would be beneficial to re-evaluate after surgery which is planned for Monday, 10/27/16. MD please re-order PT if you agree s/p surgery. See below for any follow-up Physical Therapy or equipment needs. PT is signing off. Thank you for this referral.       Follow Up Recommendations Other (comment) (Will reassess after surgery planned for 3/26)    Equipment Recommendations  Other (comment) (Will reassess after surgery 3/26)    Recommendations for Other Services       Precautions / Restrictions Precautions Precautions: Fall Restrictions Weight Bearing Restrictions: No      Mobility  Bed Mobility Overal bed mobility: Independent                Transfers Overall transfer level: Modified independent Equipment used: None             General transfer comment: Min use of UE's to stand; no use of UE's to sit  Ambulation/Gait Ambulation/Gait assistance: Modified independent (Device/Increase time) Ambulation Distance (Feet): 500 Feet Assistive device: None Gait Pattern/deviations: WFL(Within Functional Limits);Step-through pattern   Gait velocity interpretation: >2.62 ft/sec, indicative of independent community ambulator General Gait Details: pt decreased  speed when challenged with DGI tasks  Stairs Stairs: Yes Stairs assistance: Min guard Stair Management: One rail Left;Alternating pattern Number of Stairs: 11 General stair comments: pt briskly ascended and descended stairs before any instruction given.  Wheelchair Mobility    Modified Rankin (Stroke Patients Only)       Balance Overall balance assessment: Needs assistance Sitting-balance support: No upper extremity supported;Feet supported Sitting balance-Leahy Scale: Normal     Standing balance support: No upper extremity supported;During functional activity Standing balance-Leahy Scale: Good                   Standardized Balance Assessment Standardized Balance Assessment : Dynamic Gait Index   Dynamic Gait Index Level Surface: Normal Change in Gait Speed: Normal Gait and Pivot Turn: Normal Step Over Obstacle: Mild Impairment Step Around Obstacles: Normal Steps: Mild Impairment       Pertinent Vitals/Pain Pain Assessment: Faces Faces Pain Scale: No hurt    Home Living Family/patient expects to be discharged to:: Private residence Living Arrangements: Alone Available Help at Discharge: Family;Friend(s);Available 24 hours/day Type of Home: House Home Access: Stairs to enter Entrance Stairs-Rails: None Entrance Stairs-Number of Steps: 1 Home Layout: Able to live on main level with bedroom/bathroom;Two level Home Equipment: Shower seat - built in      Prior Function Level of Independence: Independent               Hand Dominance        Extremity/Trunk Assessment   Upper Extremity Assessment Upper Extremity Assessment: Overall WFL for tasks assessed    Lower Extremity Assessment Lower Extremity Assessment: Overall [facility]  for tasks assessed    Cervical / Trunk Assessment Cervical / Trunk Assessment: Normal  Communication   Communication: Expressive difficulties  Cognition Arousal/Alertness: Awake/alert Behavior During Therapy:  Impulsive Overall Cognitive Status: Impaired/Different from baseline Area of Impairment: Safety/judgement;Problem solving;Memory;Following commands                     Memory: Decreased short-term memory Following Commands: Follows multi-step commands inconsistently;Follows multi-step commands with increased time Safety/Judgement: Decreased awareness of safety;Decreased awareness of deficits   Problem Solving: Slow processing;Decreased initiation;Difficulty sequencing;Requires verbal cues        General Comments      Exercises     Assessment/Plan    PT Assessment Patent does not need any further PT services  PT Problem List         PT Treatment Interventions      PT Goals (Current goals can be found in the Care Plan section)  Acute Rehab PT Goals Patient Stated Goal: Pt did not state goals during session PT Goal Formulation: All assessment and education complete, DC therapy    Frequency     Barriers to discharge        Co-evaluation               End of Session Equipment Utilized During Treatment: Gait belt Activity Tolerance: Patient tolerated treatment well Patient left: in chair;with call bell/phone within reach;with chair alarm set;with family/visitor present Nurse Communication: Mobility status PT Visit Diagnosis: Other symptoms and signs involving the nervous system (R29.898)    Time: 7867-6720 PT Time Calculation (min) (ACUTE ONLY): 23 min   Charges:   PT Evaluation $PT Eval Moderate Complexity: 1 Procedure PT Treatments $Gait Training: 8-22 mins   PT G Codes:        Thelma Comp November 18, 2016, 2:01 PM   Rolinda Roan, PT, DPT Acute Rehabilitation Services Pager: [phone]

## 2016-10-24 NOTE — Progress Notes (Signed)
Subjective: Patient reports improvement with expressive aphasia, but limits most responses to one word  Objective: Vital signs in last 24 hours: Temp:  [98.1 F (36.7 C)-98.6 F (37 C)] 98.1 F (36.7 C) (03/23 0534) Pulse Rate:  [78-98] 78 (03/23 0534) Resp:  [18-20] 20 (03/23 0534) BP: (136-156)/(82-93) 151/83 (03/23 0534) SpO2:  [93 %-98 %] 95 % (03/23 0534)  Intake/Output from previous day: 03/22 0701 - 03/23 0700 In: 2693.8 [P.O.:1080; I.V.:1613.8] Out: 600 [Urine:600] Intake/Output this shift: No intake/output data recorded.  Alert, smiling. Daughter present, attentive. Some improvement noted with Decadron. She offers no conversation but answers questions promptly. She and her daughter agree with plan for MRI and surgery (likley Tuesday).  Lab Results:  Recent Labs  10/23/16 0248 10/24/16 0435  WBC 6.6 11.3*  HGB 13.2 12.4  HCT 39.9 37.5  PLT 154 169   BMET  Recent Labs  10/23/16 0248 10/24/16 0435  NA 140 139  K 3.0* 4.2  CL 105 104  CO2 21* 25  GLUCOSE 156* 166*  BUN 6 12  CREATININE 0.71 0.74  CALCIUM 8.7* 9.3    Studies/Results: Dg Chest 2 View  Result Date: 10/22/2016 CLINICAL DATA:  Increased confusion history of brain cancer EXAM: CHEST  2 VIEW COMPARISON:  None. FINDINGS: Surgical clips in the right axilla. There is a oval retrocardiac opacity which presumably represents a large hiatal hernia. Cannot exclude atelectasis or infiltrate at the left base. No pneumothorax. IMPRESSION: 1. Difficult to exclude atelectasis or infiltrate at the left lung base 2. Retrocardiac opacity likely reflects a moderate to large hiatal hernia. Electronically Signed   By: Donavan Foil M.D.   On: 10/22/2016 21:32   Ct Head Wo Contrast  Result Date: 10/22/2016 CLINICAL DATA:  Altered mental status since yesterday. EXAM: CT HEAD WITHOUT CONTRAST TECHNIQUE: Contiguous axial images were obtained from the base of the skull through the vertex without intravenous contrast.  COMPARISON:  None. FINDINGS: Brain: Masslike lesion within the peripheral aspects of the left frontal lobe, measuring approximately 2.7 x 2.5 cm, with surrounding vasogenic edema. Associated local mass effect with sulcal effacement and slight rightward midline shift of the anterior falx. No intracranial hemorrhage seen. Vascular: There are chronic calcified atherosclerotic changes of the large vessels at the skull base. No unexpected hyperdense vessel. Skull: Normal. Negative for fracture or focal lesion. Sinuses/Orbits: No acute finding. Other: None. IMPRESSION: 1. Masslike lesion within the left frontal lobe, measuring approximately 2.7 x 2.5 cm, with surrounding vasogenic edema, highly suspicious for neoplastic mass. Ordering physician tells me that patient has a history of breast cancer, therefore, this is a suspected intracranial metastasis. Recommend MRI brain with contrast for further characterization. 2. Local mass effect within the left frontal lobe with an associated mild rightward midline shift of the anterior falx. 3. No intracranial hemorrhage seen. These results were called by telephone at the time of interpretation on 10/22/2016 at 4:55 pm to Dr. Domenic Moras , who verbally acknowledged these results. Electronically Signed   By: Franki Cabot M.D.   On: 10/22/2016 16:59   Ct Chest W Contrast  Result Date: 10/23/2016 CLINICAL DATA:  Sepsis. Pt denies chest pain or sob. Pt denies abd pain or complaints. EXAM: CT CHEST, ABDOMEN, AND PELVIS WITH CONTRAST TECHNIQUE: Multidetector CT imaging of the chest, abdomen and pelvis was performed following the standard protocol during bolus administration of intravenous contrast. CONTRAST:  157mL ISOVUE-300 IOPAMIDOL (ISOVUE-300) INJECTION 61% COMPARISON:  None. FINDINGS: CT CHEST FINDINGS Cardiovascular: Heart is normal  size. Aortic calcifications. No aneurysm. Scattered coronary artery calcifications. Mediastinum/Nodes: No mediastinal, hilar, or axillary  adenopathy. Large hiatal hernia containing much of the stomach. Trachea is unremarkable. Thyroid and thoracic inlet unremarkable. Lungs/Pleura: Linear platelike scarring or atelectasis in the lower lobes adjacent to the large hiatal hernia. No effusions. No suspicious nodules. Musculoskeletal: No acute bony abnormality. CT ABDOMEN PELVIS FINDINGS Hepatobiliary: Large central cyst in the liver measuring 12.5 x 11 cm. Other smaller scattered cysts. Gallbladder grossly unremarkable. Pancreas: No focal abnormality or ductal dilatation. Spleen: No focal abnormality.  Normal size. Adrenals/Urinary Tract: Small renal cysts bilaterally. No hydronephrosis or suspicious mass. Adrenal glands and urinary bladder unremarkable. Stomach/Bowel: Large and small bowel decompressed, unremarkable. Vascular/Lymphatic: No evidence of aneurysm or adenopathy. Diffuse aortic and iliac calcifications. Reproductive: Prior hysterectomy.  No adnexal masses. Other: No free fluid or free air. Musculoskeletal: No acute bony abnormality. Hemangioma in the L1 vertebral body. IMPRESSION: No acute findings in the chest, abdomen or pelvis. Large hiatal hernia. Compressive atelectasis or scarring in the lower lobes bilaterally. Aortic atherosclerosis, scattered coronary artery disease. Hepatic and renal cysts. Electronically Signed   By: Rolm Baptise M.D.   On: 10/23/2016 08:42   Mr Jeri Cos DX Contrast  Result Date: 10/22/2016 CLINICAL DATA:  Altered mental status beginning yesterday. Increased confusion. Abnormal CT scan frontal brain mass. EXAM: MRI HEAD WITHOUT AND WITH CONTRAST TECHNIQUE: Multiplanar, multiecho pulse sequences of the brain and surrounding structures were obtained without and with intravenous contrast. CONTRAST:  15 mL MultiHance COMPARISON:  CT head without contrast from the same day. FINDINGS: Brain: A peripherally enhancing left frontal lesion demonstrates a somewhat irregular thickened periphery. There is some T1 shortening  along the rim of the lesion as well. The central contents demonstrate restricted diffusion. There is marked surrounding edema results in mass effect and effacement of the adjacent sulci. Minimal midline shift is noted. The enhancement involves the adjacent aura extends along the anterior left frontal convexity. No other focal areas of restricted diffusion or enhancement are present. There are is no acute hemorrhage or other mass lesion. No acute infarct is present. The internal auditory canals are within normal limits bilaterally. The brainstem and cerebellum are normal. Vascular: Flow is present in the major intracranial arteries. Skull and upper cervical spine: The skullbase is within normal limits. Midline sagittal structures are unremarkable. The craniocervical junction is normal. Marrow signal is normal. The upper cervical spine is within normal limits. Sinuses/Orbits: Mild mucosal thickening is present within the ethmoid air cells bilaterally. The remaining paranasal sinuses and mastoid air cells are clear. IMPRESSION: 1. Irregular peripherally enhancing mass lesion in the anterior left frontal lobe measuring at 2.6 x 3.0 x 2.0 cm with associated enhancement of the adjacent dura and central restricted diffusion is most consistent with an abscess. Neoplasm is considered much less likely. 2. No other discrete lesions. 3. Minimal ethmoid sinus disease. These results were called by telephone at the time of interpretation on 10/22/2016 at 8:33 pm to [facility] PA , who verbally acknowledged these results. Electronically Signed   By: San Morelle M.D.   On: 10/22/2016 20:37   Ct Abdomen Pelvis W Contrast  Result Date: 10/23/2016 CLINICAL DATA:  Sepsis. Pt denies chest pain or sob. Pt denies abd pain or complaints. EXAM: CT CHEST, ABDOMEN, AND PELVIS WITH CONTRAST TECHNIQUE: Multidetector CT imaging of the chest, abdomen and pelvis was performed following the standard protocol during bolus administration  of intravenous contrast. CONTRAST:  11mL ISOVUE-300 IOPAMIDOL (ISOVUE-300)  INJECTION 61% COMPARISON:  None. FINDINGS: CT CHEST FINDINGS Cardiovascular: Heart is normal size. Aortic calcifications. No aneurysm. Scattered coronary artery calcifications. Mediastinum/Nodes: No mediastinal, hilar, or axillary adenopathy. Large hiatal hernia containing much of the stomach. Trachea is unremarkable. Thyroid and thoracic inlet unremarkable. Lungs/Pleura: Linear platelike scarring or atelectasis in the lower lobes adjacent to the large hiatal hernia. No effusions. No suspicious nodules. Musculoskeletal: No acute bony abnormality. CT ABDOMEN PELVIS FINDINGS Hepatobiliary: Large central cyst in the liver measuring 12.5 x 11 cm. Other smaller scattered cysts. Gallbladder grossly unremarkable. Pancreas: No focal abnormality or ductal dilatation. Spleen: No focal abnormality.  Normal size. Adrenals/Urinary Tract: Small renal cysts bilaterally. No hydronephrosis or suspicious mass. Adrenal glands and urinary bladder unremarkable. Stomach/Bowel: Large and small bowel decompressed, unremarkable. Vascular/Lymphatic: No evidence of aneurysm or adenopathy. Diffuse aortic and iliac calcifications. Reproductive: Prior hysterectomy.  No adnexal masses. Other: No free fluid or free air. Musculoskeletal: No acute bony abnormality. Hemangioma in the L1 vertebral body. IMPRESSION: No acute findings in the chest, abdomen or pelvis. Large hiatal hernia. Compressive atelectasis or scarring in the lower lobes bilaterally. Aortic atherosclerosis, scattered coronary artery disease. Hepatic and renal cysts. Electronically Signed   By: Rolm Baptise M.D.   On: 10/23/2016 08:42    Assessment/Plan:   LOS: 2 days  Will continue Decadron and Keppra. Awaiting BrainLab MRI. Planning craniotomy Tuesday.    Verdis Prime 10/24/2016, 7:48 AM

## 2016-10-24 NOTE — Progress Notes (Addendum)
PROGRESS NOTE   Alexandria [name]  PFX:[id number]    DOB: May 01, 1946    DOA: 10/22/2016  PCP: Osborne Casco, MD   I have briefly reviewed patients previous medical records in [facility].  Brief Narrative:  71 year old female, lives alone and independent of activities of daily living, PMH of remote history of breast cancer, HTN, presented to ED with altered mental status, difficulty communicating, confusion, left frontal temporal headache ongoing for few days prior to admission. In the ED CT head demonstrated masslike lesion in the left frontal lobe with surrounding vasogenic edema with localized mass effect. Oncology was concerned about malignancy and suggested MRI brain, steroids and neurosurgery consultation. MRI brain concerning for abscess versus malignancy. Neurosurgery saw patient and recommended admission at [facility] for further evaluation and management.   Assessment & Plan:   Principal Problem:   Acute encephalopathy Active Problems:   Brain abscess   Hx of breast cancer   Essential hypertension   Hypokalemia   Frontal mass of brain   Left frontal lobe ring-enhancing mass - CT head without contrast 10/22/16: Left frontal lobe mass lesion to by 7 x 2.5 cm with surrounding vasogenic edema, highly suspicious for neoplastic mass. Local mass effect within the left frontal lobe with associated mild rightward midline shift. No intracranial hemorrhage. - MRI brain 3/21: Irregular peripherally enhancing mass lesion in the anterior left frontal lobe measuring 2.6 x 3 x 2 cm with associated enhancement of the adjacent dura and central restricted diffusion most consistent with an abscess. Neoplasm considered less likely. -Surveillance CT chest abdomen and pelvis without acute lesions or lesions suspicious for malignancy. - Brain lab MRI 10/22/16: Report pending.  - Neurosurgery consultation and follow-up appreciated. She has improved on IV Decadron. Although radiologist  interpreted her brain MRI is most consistent with abscess, given lack of leukocytosis and fever, surgeons are concerned about a primary brain tumor. Keppra initiated. Surgery planned for early next week. Patient and family initially were considering transferred to [facility] but now agreeable to having treatment here.  Acute encephalopathy - Secondary to brain lesion as above. Improved after steroids.  Hypokalemia Replaced. Magnesium normal.  Essential hypertension Mildly uncontrolled. Continue Toprol-XL &. Benazepril-HCTZ. When necessary IV hydralazine.  Mild hyperglycemia Probably related to steroids. Monitor CBGs and consider SSI.  Sinus tachycardia May have been related to brain lesion. Resolved. TSH normal. DC telemetry. Continue Toprol.    DVT prophylaxis: SCDs  Code Status: Full  Family Communication: Discussed in detail with patient's son-in-law at bedside.  Disposition: To be determined.   Consultants:  Neurosurgery EDP discussed with oncology   Procedures:  None  Antimicrobials:  None    Subjective: Seen this morning. States that she feels better. No headache since yesterday. Appetite low but improving. No nausea, vomiting reported. As per her son-in-law at bedside, mental status is significantly improved.   ROS: No pain reported.   Objective:  Vitals:   10/23/16 1809 10/23/16 2115 10/24/16 0141 10/24/16 0534  BP: (!) 145/82 (!) 154/86 (!) 147/88 (!) 151/83  Pulse: 92 98 82 78  Resp: 18 20 20 20   Temp: 98.1 F (36.7 C) 98.6 F (37 C) 98.1 F (36.7 C) 98.1 F (36.7 C)  TempSrc: Oral Oral Oral Oral  SpO2: 93% 98% 98% 95%  Weight:      Height:        Examination:  General exam: Pleasant elderly female lying comfortably propped up in bed.  Respiratory system: Clear to auscultation. Respiratory effort normal. Cardiovascular  system: S1 & S2 heard, RRR. No JVD, murmurs, rubs, gallops or clicks. No pedal edema.Telemetry: Sinus rhythm.  Gastrointestinal  system: Abdomen is nondistended, soft and nontender. No organomegaly or masses felt. Normal bowel sounds heard. Central nervous system: Alert and oriented. No focal neurological deficits. Extremities: Symmetric 5 x 5 power. Skin: No rashes, lesions or ulcers Psychiatry: Judgement and insight appear normal. Mood & affect appropriate.     Data Reviewed: I have personally reviewed following labs and imaging studies  CBC:  Recent Labs Lab 10/22/16 1835 10/22/16 2111 10/23/16 0248 10/24/16 0435  WBC  --  6.8 6.6 11.3*  NEUTROABS  --  4.7  --   --   HGB 13.3 12.0 13.2 12.4  HCT 39.0 35.4* 39.9 37.5  MCV  --  85.7 88.9 88.4  PLT  --  149* 154 299   Basic Metabolic Panel:  Recent Labs Lab 10/22/16 1835 10/22/16 2111 10/23/16 0248 10/23/16 1003 10/24/16 0435  NA 141 140 140  --  139  K 3.4* 2.6* 3.0*  --  4.2  CL 107 109 105  --  104  CO2  --  24 21*  --  25  GLUCOSE 99 118* 156*  --  166*  BUN 11 9 6   --  12  CREATININE 0.70 0.64 0.71  --  0.74  CALCIUM  --  8.8* 8.7*  --  9.3  MG  --   --   --  1.8  --    Liver Function Tests:  Recent Labs Lab 10/22/16 2111 10/24/16 0435  AST 16 20  ALT 12* 13*  ALKPHOS 49 49  BILITOT 1.0 0.7  PROT 6.6 6.3*  ALBUMIN 3.8 3.1*   Coagulation Profile:  Recent Labs Lab 10/23/16 0248  INR 1.08   Cardiac Enzymes: No results for input(s): CKTOTAL, CKMB, CKMBINDEX, TROPONINI in the last 168 hours. HbA1C: No results for input(s): HGBA1C in the last 72 hours. CBG:  Recent Labs Lab 10/22/16 1707  GLUCAP 104*    Recent Results (from the past 240 hour(s))  Culture, blood (x 2)     Status: None (Preliminary result)   Collection Time: 10/23/16  2:55 AM  Result Value Ref Range Status   Specimen Description BLOOD LEFT ANTECUBITAL  Final   Special Requests BOTTLES DRAWN AEROBIC AND ANAEROBIC 10CC EACH  Final   Culture NO GROWTH 1 DAY  Final   Report Status PENDING  Incomplete  Culture, blood (x 2)     Status: None  (Preliminary result)   Collection Time: 10/23/16  3:04 AM  Result Value Ref Range Status   Specimen Description BLOOD LEFT HAND  Final   Special Requests BOTTLES DRAWN AEROBIC AND ANAEROBIC 10CC EACH  Final   Culture NO GROWTH 1 DAY  Final   Report Status PENDING  Incomplete         Radiology Studies: Dg Chest 2 View  Result Date: 10/22/2016 CLINICAL DATA:  Increased confusion history of brain cancer EXAM: CHEST  2 VIEW COMPARISON:  None. FINDINGS: Surgical clips in the right axilla. There is a oval retrocardiac opacity which presumably represents a large hiatal hernia. Cannot exclude atelectasis or infiltrate at the left base. No pneumothorax. IMPRESSION: 1. Difficult to exclude atelectasis or infiltrate at the left lung base 2. Retrocardiac opacity likely reflects a moderate to large hiatal hernia. Electronically Signed   By: Donavan Foil M.D.   On: 10/22/2016 21:32   Ct Head Wo Contrast  Result Date: 10/22/2016 CLINICAL  DATA:  Altered mental status since yesterday. EXAM: CT HEAD WITHOUT CONTRAST TECHNIQUE: Contiguous axial images were obtained from the base of the skull through the vertex without intravenous contrast. COMPARISON:  None. FINDINGS: Brain: Masslike lesion within the peripheral aspects of the left frontal lobe, measuring approximately 2.7 x 2.5 cm, with surrounding vasogenic edema. Associated local mass effect with sulcal effacement and slight rightward midline shift of the anterior falx. No intracranial hemorrhage seen. Vascular: There are chronic calcified atherosclerotic changes of the large vessels at the skull base. No unexpected hyperdense vessel. Skull: Normal. Negative for fracture or focal lesion. Sinuses/Orbits: No acute finding. Other: None. IMPRESSION: 1. Masslike lesion within the left frontal lobe, measuring approximately 2.7 x 2.5 cm, with surrounding vasogenic edema, highly suspicious for neoplastic mass. Ordering physician tells me that patient has a history of  breast cancer, therefore, this is a suspected intracranial metastasis. Recommend MRI brain with contrast for further characterization. 2. Local mass effect within the left frontal lobe with an associated mild rightward midline shift of the anterior falx. 3. No intracranial hemorrhage seen. These results were called by telephone at the time of interpretation on 10/22/2016 at 4:55 pm to Dr. Domenic Moras , who verbally acknowledged these results. Electronically Signed   By: Franki Cabot M.D.   On: 10/22/2016 16:59   Ct Chest W Contrast  Result Date: 10/23/2016 CLINICAL DATA:  Sepsis. Pt denies chest pain or sob. Pt denies abd pain or complaints. EXAM: CT CHEST, ABDOMEN, AND PELVIS WITH CONTRAST TECHNIQUE: Multidetector CT imaging of the chest, abdomen and pelvis was performed following the standard protocol during bolus administration of intravenous contrast. CONTRAST:  177mL ISOVUE-300 IOPAMIDOL (ISOVUE-300) INJECTION 61% COMPARISON:  None. FINDINGS: CT CHEST FINDINGS Cardiovascular: Heart is normal size. Aortic calcifications. No aneurysm. Scattered coronary artery calcifications. Mediastinum/Nodes: No mediastinal, hilar, or axillary adenopathy. Large hiatal hernia containing much of the stomach. Trachea is unremarkable. Thyroid and thoracic inlet unremarkable. Lungs/Pleura: Linear platelike scarring or atelectasis in the lower lobes adjacent to the large hiatal hernia. No effusions. No suspicious nodules. Musculoskeletal: No acute bony abnormality. CT ABDOMEN PELVIS FINDINGS Hepatobiliary: Large central cyst in the liver measuring 12.5 x 11 cm. Other smaller scattered cysts. Gallbladder grossly unremarkable. Pancreas: No focal abnormality or ductal dilatation. Spleen: No focal abnormality.  Normal size. Adrenals/Urinary Tract: Small renal cysts bilaterally. No hydronephrosis or suspicious mass. Adrenal glands and urinary bladder unremarkable. Stomach/Bowel: Large and small bowel decompressed, unremarkable.  Vascular/Lymphatic: No evidence of aneurysm or adenopathy. Diffuse aortic and iliac calcifications. Reproductive: Prior hysterectomy.  No adnexal masses. Other: No free fluid or free air. Musculoskeletal: No acute bony abnormality. Hemangioma in the L1 vertebral body. IMPRESSION: No acute findings in the chest, abdomen or pelvis. Large hiatal hernia. Compressive atelectasis or scarring in the lower lobes bilaterally. Aortic atherosclerosis, scattered coronary artery disease. Hepatic and renal cysts. Electronically Signed   By: Rolm Baptise M.D.   On: 10/23/2016 08:42   Mr Jeri Cos OM Contrast  Result Date: 10/22/2016 CLINICAL DATA:  Altered mental status beginning yesterday. Increased confusion. Abnormal CT scan frontal brain mass. EXAM: MRI HEAD WITHOUT AND WITH CONTRAST TECHNIQUE: Multiplanar, multiecho pulse sequences of the brain and surrounding structures were obtained without and with intravenous contrast. CONTRAST:  15 mL MultiHance COMPARISON:  CT head without contrast from the same day. FINDINGS: Brain: A peripherally enhancing left frontal lesion demonstrates a somewhat irregular thickened periphery. There is some T1 shortening along the rim of the lesion as well. The central  contents demonstrate restricted diffusion. There is marked surrounding edema results in mass effect and effacement of the adjacent sulci. Minimal midline shift is noted. The enhancement involves the adjacent aura extends along the anterior left frontal convexity. No other focal areas of restricted diffusion or enhancement are present. There are is no acute hemorrhage or other mass lesion. No acute infarct is present. The internal auditory canals are within normal limits bilaterally. The brainstem and cerebellum are normal. Vascular: Flow is present in the major intracranial arteries. Skull and upper cervical spine: The skullbase is within normal limits. Midline sagittal structures are unremarkable. The craniocervical junction is  normal. Marrow signal is normal. The upper cervical spine is within normal limits. Sinuses/Orbits: Mild mucosal thickening is present within the ethmoid air cells bilaterally. The remaining paranasal sinuses and mastoid air cells are clear. IMPRESSION: 1. Irregular peripherally enhancing mass lesion in the anterior left frontal lobe measuring at 2.6 x 3.0 x 2.0 cm with associated enhancement of the adjacent dura and central restricted diffusion is most consistent with an abscess. Neoplasm is considered much less likely. 2. No other discrete lesions. 3. Minimal ethmoid sinus disease. These results were called by telephone at the time of interpretation on 10/22/2016 at 8:33 pm to [facility] , who verbally acknowledged these results. Electronically Signed   By: San Morelle M.D.   On: 10/22/2016 20:37   Ct Abdomen Pelvis W Contrast  Result Date: 10/23/2016 CLINICAL DATA:  Sepsis. Pt denies chest pain or sob. Pt denies abd pain or complaints. EXAM: CT CHEST, ABDOMEN, AND PELVIS WITH CONTRAST TECHNIQUE: Multidetector CT imaging of the chest, abdomen and pelvis was performed following the standard protocol during bolus administration of intravenous contrast. CONTRAST:  156mL ISOVUE-300 IOPAMIDOL (ISOVUE-300) INJECTION 61% COMPARISON:  None. FINDINGS: CT CHEST FINDINGS Cardiovascular: Heart is normal size. Aortic calcifications. No aneurysm. Scattered coronary artery calcifications. Mediastinum/Nodes: No mediastinal, hilar, or axillary adenopathy. Large hiatal hernia containing much of the stomach. Trachea is unremarkable. Thyroid and thoracic inlet unremarkable. Lungs/Pleura: Linear platelike scarring or atelectasis in the lower lobes adjacent to the large hiatal hernia. No effusions. No suspicious nodules. Musculoskeletal: No acute bony abnormality. CT ABDOMEN PELVIS FINDINGS Hepatobiliary: Large central cyst in the liver measuring 12.5 x 11 cm. Other smaller scattered cysts. Gallbladder grossly  unremarkable. Pancreas: No focal abnormality or ductal dilatation. Spleen: No focal abnormality.  Normal size. Adrenals/Urinary Tract: Small renal cysts bilaterally. No hydronephrosis or suspicious mass. Adrenal glands and urinary bladder unremarkable. Stomach/Bowel: Large and small bowel decompressed, unremarkable. Vascular/Lymphatic: No evidence of aneurysm or adenopathy. Diffuse aortic and iliac calcifications. Reproductive: Prior hysterectomy.  No adnexal masses. Other: No free fluid or free air. Musculoskeletal: No acute bony abnormality. Hemangioma in the L1 vertebral body. IMPRESSION: No acute findings in the chest, abdomen or pelvis. Large hiatal hernia. Compressive atelectasis or scarring in the lower lobes bilaterally. Aortic atherosclerosis, scattered coronary artery disease. Hepatic and renal cysts. Electronically Signed   By: Rolm Baptise M.D.   On: 10/23/2016 08:42        Scheduled Meds: . benazepril  20 mg Oral Daily   And  . hydrochlorothiazide  25 mg Oral Daily  . dexamethasone  4 mg Intravenous Q6H  . levETIRAcetam  500 mg Oral BID  . metoprolol succinate  100 mg Oral QPM  . potassium chloride  20 mEq Oral Once   Continuous Infusions: . lactated ringers Stopped (10/24/16 0300)     LOS: 2 days     HONGALGI,ANAND, MD,  FACP, FHM. [facility] Pager [phone]  If 7PM-7AM, please contact night-coverage [email or web address] Password [email or web address] 10/24/2016, 3:15 PM

## 2016-10-25 DIAGNOSIS — D496 Neoplasm of unspecified behavior of brain: Secondary | ICD-10-CM

## 2016-10-25 LAB — GLUCOSE, CAPILLARY
GLUCOSE-CAPILLARY: 141 mg/dL — AB (ref 65–99)
GLUCOSE-CAPILLARY: 148 mg/dL — AB (ref 65–99)
GLUCOSE-CAPILLARY: 166 mg/dL — AB (ref 65–99)
Glucose-Capillary: 144 mg/dL — ABNORMAL HIGH (ref 65–99)

## 2016-10-25 LAB — MRSA PCR SCREENING: MRSA BY PCR: NEGATIVE

## 2016-10-25 LAB — URINE CULTURE

## 2016-10-25 LAB — HEMOGLOBIN A1C
Hgb A1c MFr Bld: 5.8 % — ABNORMAL HIGH (ref 4.8–5.6)
Mean Plasma Glucose: 120 mg/dL

## 2016-10-25 NOTE — Progress Notes (Signed)
Surgical consent signed and placed in pt chart. MRSA screening complete.

## 2016-10-25 NOTE — Progress Notes (Signed)
PROGRESS NOTE   Alexandria [name]  JJK:[id number]    DOB: [birth year]    DOA: 10/22/2016  PCP: Osborne Casco, MD   I have briefly reviewed patients previous medical records in [facility].  Brief Narrative:  71 year old female, lives alone and independent of activities of daily living, PMH of remote history of breast cancer, HTN, presented to ED with altered mental status, difficulty communicating, confusion, left frontal temporal headache ongoing for few days prior to admission. In the ED CT head demonstrated masslike lesion in the left frontal lobe with surrounding vasogenic edema with localized mass effect. Oncology was concerned about malignancy and suggested MRI brain, steroids and neurosurgery consultation. MRI brain concerning for abscess versus malignancy. Neurosurgery plans brain surgery on 10/27/16  Assessment & Plan:   Principal Problem:   Acute encephalopathy Active Problems:   Brain abscess   Hx of breast cancer   Essential hypertension   Hypokalemia   Frontal mass of brain   Left frontal lobe ring-enhancing mass - CT head without contrast 10/22/16: Left frontal lobe mass lesion to by 7 x 2.5 cm with surrounding vasogenic edema, highly suspicious for neoplastic mass. Local mass effect within the left frontal lobe with associated mild rightward midline shift. No intracranial hemorrhage. - MRI brain 3/21: Irregular peripherally enhancing mass lesion in the anterior left frontal lobe measuring 2.6 x 3 x 2 cm with associated enhancement of the adjacent dura and central restricted diffusion most consistent with an abscess. Neoplasm considered less likely. -Surveillance CT chest abdomen and pelvis without acute lesions or lesions suspicious for malignancy. - Brain lab MRI 10/22/16: Results reviewed. - Neurosurgery consultation and follow-up appreciated. She has improved on IV Decadron. Although radiologist interpreted her brain MRI is most consistent with abscess, given  lack of leukocytosis and fever, surgeons are concerned about a primary brain tumor. Keppra initiated. Surgery planned for 3/26. Patient and family initially were considering transferred to [facility] but now agreeable to having treatment here. - Neurosurgery follow-up appreciated.  Acute encephalopathy - Secondary to brain lesion as above. Improved after steroids.  Hypokalemia Replaced. Magnesium normal.  Essential hypertension Mildly uncontrolled. Continue Toprol-XL &. Benazepril-HCTZ. When necessary IV hydralazine.  Mild hyperglycemia Probably related to steroids. Monitor CBGs and consider SSI. A1c 5.8.  Sinus tachycardia May have been related to brain lesion. Resolved. TSH normal. DC telemetry. Continue Toprol.    DVT prophylaxis: SCDs  Code Status: Full  Family Communication: Discussed in detail with patient's female friend at bedside. Disposition: To be determined.   Consultants:  Neurosurgery EDP discussed with oncology   Procedures:  None  Antimicrobials:  None    Subjective: Seen this morning. Denies headache. Appears slightly dysphasic. As per friend at bedside, still has some confusion.  ROS: No pain reported.   Objective:  Vitals:   10/24/16 2103 10/25/16 0500 10/25/16 1018 10/25/16 1415  BP: 120/69 (!) 141/76 139/74 (!) 148/68  Pulse: 73 65 68 70  Resp: 20 20 18 18   Temp:  98.7 F (37.1 C) 97.9 F (36.6 C) 98 F (36.7 C)  TempSrc: Oral Oral Oral Oral  SpO2: 93% 93% 93% 92%  Weight:      Height:        Examination:  General exam: Pleasant elderly female Sitting up comfortably in chair. Respiratory system: Clear to auscultation. Respiratory effort normal. Cardiovascular system: S1 & S2 heard, RRR. No JVD, murmurs, rubs, gallops or clicks. No pedal edema.Telemetry: Sinus rhythm.  Gastrointestinal system: Abdomen is nondistended, soft and nontender.  No organomegaly or masses felt. Normal bowel sounds heard. Central nervous system: Alert and oriented.  Dysphasia +. Extremities: Symmetric 5 x 5 power. Skin: No rashes, lesions or ulcers Psychiatry: Judgement and insight appear normal. Mood & affect appropriate.     Data Reviewed: I have personally reviewed following labs and imaging studies  CBC:  Recent Labs Lab 10/22/16 1835 10/22/16 2111 10/23/16 0248 10/24/16 0435  WBC  --  6.8 6.6 11.3*  NEUTROABS  --  4.7  --   --   HGB 13.3 12.0 13.2 12.4  HCT 39.0 35.4* 39.9 37.5  MCV  --  85.7 88.9 88.4  PLT  --  149* 154 706   Basic Metabolic Panel:  Recent Labs Lab 10/22/16 1835 10/22/16 2111 10/23/16 0248 10/23/16 1003 10/24/16 0435  NA 141 140 140  --  139  K 3.4* 2.6* 3.0*  --  4.2  CL 107 109 105  --  104  CO2  --  24 21*  --  25  GLUCOSE 99 118* 156*  --  166*  BUN 11 9 6   --  12  CREATININE 0.70 0.64 0.71  --  0.74  CALCIUM  --  8.8* 8.7*  --  9.3  MG  --   --   --  1.8  --    Liver Function Tests:  Recent Labs Lab 10/22/16 2111 10/24/16 0435  AST 16 20  ALT 12* 13*  ALKPHOS 49 49  BILITOT 1.0 0.7  PROT 6.6 6.3*  ALBUMIN 3.8 3.1*   Coagulation Profile:  Recent Labs Lab 10/23/16 0248  INR 1.08   Cardiac Enzymes: No results for input(s): CKTOTAL, CKMB, CKMBINDEX, TROPONINI in the last 168 hours. HbA1C:  Recent Labs  10/24/16 1543  HGBA1C 5.8*   CBG:  Recent Labs Lab 10/22/16 1707 10/24/16 1709 10/24/16 2106 10/25/16 0623 10/25/16 1122  GLUCAP 104* 145* 179* 141* 144*    Recent Results (from the past 240 hour(s))  Culture, blood (x 2)     Status: None (Preliminary result)   Collection Time: 10/23/16  2:55 AM  Result Value Ref Range Status   Specimen Description BLOOD LEFT ANTECUBITAL  Final   Special Requests BOTTLES DRAWN AEROBIC AND ANAEROBIC 10CC EACH  Final   Culture NO GROWTH 2 DAYS  Final   Report Status PENDING  Incomplete  Culture, blood (x 2)     Status: None (Preliminary result)   Collection Time: 10/23/16  3:04 AM  Result Value Ref Range Status   Specimen  Description BLOOD LEFT HAND  Final   Special Requests BOTTLES DRAWN AEROBIC AND ANAEROBIC 10CC EACH  Final   Culture NO GROWTH 2 DAYS  Final   Report Status PENDING  Incomplete  Culture, Urine     Status: Abnormal   Collection Time: 10/24/16  8:25 AM  Result Value Ref Range Status   Specimen Description URINE, RANDOM  Final   Special Requests NONE  Final   Culture MULTIPLE SPECIES PRESENT, SUGGEST RECOLLECTION (A)  Final   Report Status 10/25/2016 FINAL  Final         Radiology Studies: Mr Jeri Cos CB Contrast  Result Date: 10/24/2016 CLINICAL DATA:  Continued surveillance LEFT frontal mass. Brain lab protocol. EXAM: MRI HEAD WITHOUT AND WITH CONTRAST TECHNIQUE: Multiplanar, multiecho pulse sequences of the brain and surrounding structures were obtained without and with intravenous contrast. CONTRAST:  108mL MULTIHANCE GADOBENATE DIMEGLUMINE 529 MG/ML IV SOLN COMPARISON:  10/22/2016 MR.  10/22/2016 CT. FINDINGS: Brain:  Ovoid 22 x 31 x 28 mm LEFT frontal mass, epicenter gray-white junction, marked restricted diffusion centrally, irregular thin and thick walled enhancement, adjacent abnormal dural enhancement, marked surrounding edema, all favoring brain abscess. Primary brain tumor or metastasis less favored. No cortical venous or sagittal sinus thrombosis. Some subfalcine herniation LEFT-to-RIGHT, approximately 2 mm. No significant change from priors. Diffusion tensor imaging reveals the lesion is significantly anterior to the motor strip, approximately 5 cm cephalad of the central sulcus. Superior longitudinal fasciculus is mildly depressed in its anterior margin. Vascular: Normal flow voids. Skull and upper cervical spine: Normal marrow signal. Sinuses/Orbits: Negative. Other: None. IMPRESSION: Unchanged LEFT frontal mass, favored to represent a brain abscess. See discussion above. Electronically Signed   By: Staci Righter M.D.   On: 10/24/2016 15:20        Scheduled Meds: . benazepril   20 mg Oral Daily   And  . hydrochlorothiazide  25 mg Oral Daily  . dexamethasone  4 mg Intravenous Q6H  . insulin aspart  0-9 Units Subcutaneous TID WC  . levETIRAcetam  500 mg Oral BID  . metoprolol succinate  100 mg Oral QPM  . potassium chloride  20 mEq Oral Once   Continuous Infusions:    LOS: 3 days     Payton Prinsen, MD, FACP, FHM. [facility] Pager [phone]  If 7PM-7AM, please contact night-coverage [email or web address] Password [email or web address] 10/25/2016, 3:00 PM

## 2016-10-25 NOTE — Progress Notes (Signed)
Patient ID: Alexandria [name], female   DOB: 1946/04/27, 71 y.o.   MRN: [id number] Subjective:  The patient is alert and pleasant. She remains dysphasic. Her family is at the bedside.  Objective: Vital signs in last 24 hours: Temp:  [97.9 F (36.6 C)-98.7 F (37.1 C)] 97.9 F (36.6 C) (03/24 1018) Pulse Rate:  [65-80] 68 (03/24 1018) Resp:  [18-20] 18 (03/24 1018) BP: (120-158)/(69-81) 139/74 (03/24 1018) SpO2:  [93 %-97 %] 93 % (03/24 1018)  Intake/Output from previous day: 03/23 0701 - 03/24 0700 In: 120 [P.O.:120] Out: -  Intake/Output this shift: No intake/output data recorded.  Physical exam the patient is alert and pleasant. She is dysphasic.  Lab Results:  Recent Labs  10/23/16 0248 10/24/16 0435  WBC 6.6 11.3*  HGB 13.2 12.4  HCT 39.9 37.5  PLT 154 169   BMET  Recent Labs  10/23/16 0248 10/24/16 0435  NA 140 139  K 3.0* 4.2  CL 105 104  CO2 21* 25  GLUCOSE 156* 166*  BUN 6 12  CREATININE 0.71 0.74  CALCIUM 8.7* 9.3    Studies/Results: Mr Jeri Cos VV Contrast  Result Date: 10/24/2016 CLINICAL DATA:  Continued surveillance LEFT frontal mass. Brain lab protocol. EXAM: MRI HEAD WITHOUT AND WITH CONTRAST TECHNIQUE: Multiplanar, multiecho pulse sequences of the brain and surrounding structures were obtained without and with intravenous contrast. CONTRAST:  68mL MULTIHANCE GADOBENATE DIMEGLUMINE 529 MG/ML IV SOLN COMPARISON:  10/22/2016 MR.  10/22/2016 CT. FINDINGS: Brain: Ovoid 22 x 31 x 28 mm LEFT frontal mass, epicenter gray-white junction, marked restricted diffusion centrally, irregular thin and thick walled enhancement, adjacent abnormal dural enhancement, marked surrounding edema, all favoring brain abscess. Primary brain tumor or metastasis less favored. No cortical venous or sagittal sinus thrombosis. Some subfalcine herniation LEFT-to-RIGHT, approximately 2 mm. No significant change from priors. Diffusion tensor imaging reveals the lesion is  significantly anterior to the motor strip, approximately 5 cm cephalad of the central sulcus. Superior longitudinal fasciculus is mildly depressed in its anterior margin. Vascular: Normal flow voids. Skull and upper cervical spine: Normal marrow signal. Sinuses/Orbits: Negative. Other: None. IMPRESSION: Unchanged LEFT frontal mass, favored to represent a brain abscess. See discussion above. Electronically Signed   By: Staci Righter M.D.   On: 10/24/2016 15:20    Assessment/Plan: Left frontal brain tumor: Surgery is planned for Monday. I've answered the patient's family's questions.  LOS: 3 days     Belenda Alviar D 10/25/2016, 11:06 AM

## 2016-10-26 LAB — GLUCOSE, CAPILLARY
GLUCOSE-CAPILLARY: 145 mg/dL — AB (ref 65–99)
GLUCOSE-CAPILLARY: 150 mg/dL — AB (ref 65–99)
Glucose-Capillary: 141 mg/dL — ABNORMAL HIGH (ref 65–99)
Glucose-Capillary: 145 mg/dL — ABNORMAL HIGH (ref 65–99)

## 2016-10-26 NOTE — Progress Notes (Signed)
PROGRESS NOTE   Alexandria [name]  XLK:[id number]    DOB: [birth year]    DOA: 10/22/2016  PCP: Osborne Casco, MD   I have briefly reviewed patients previous medical records in [facility].  Brief Narrative:  71 year old female, lives alone and independent of activities of daily living, PMH of remote history of breast cancer, HTN, presented to ED with altered mental status, difficulty communicating, confusion, left frontal temporal headache ongoing for few days prior to admission. In the ED CT head demonstrated masslike lesion in the left frontal lobe with surrounding vasogenic edema with localized mass effect. Oncology was concerned about malignancy and suggested MRI brain, steroids and neurosurgery consultation. MRI brain concerning for abscess versus malignancy. Neurosurgery plans brain surgery on 10/27/16  Assessment & Plan:   Principal Problem:   Acute encephalopathy Active Problems:   Brain abscess   Hx of breast cancer   Essential hypertension   Hypokalemia   Frontal mass of brain   Left frontal lobe ring-enhancing mass - CT head without contrast 10/22/16: Left frontal lobe mass lesion to by 7 x 2.5 cm with surrounding vasogenic edema, highly suspicious for neoplastic mass. Local mass effect within the left frontal lobe with associated mild rightward midline shift. No intracranial hemorrhage. - MRI brain 3/21: Irregular peripherally enhancing mass lesion in the anterior left frontal lobe measuring 2.6 x 3 x 2 cm with associated enhancement of the adjacent dura and central restricted diffusion most consistent with an abscess. Neoplasm considered less likely. -Surveillance CT chest abdomen and pelvis without acute lesions or lesions suspicious for malignancy. - Brain lab MRI 10/22/16: Results reviewed. - Neurosurgery consultation and follow-up appreciated. She has improved on IV Decadron. Although radiologist interpreted her brain MRI is most consistent with abscess, given  lack of leukocytosis and fever, surgeons are concerned about a primary brain tumor. Keppra initiated. Surgery planned for 3/26. Patient and family initially were considering transferring to [facility] but now agreeable to having treatment here. - Neurosurgery follow-up appreciated.  Acute encephalopathy - Secondary to brain lesion as above. Improved after steroids. Mental status not at baseline.  Hypokalemia Replaced. Magnesium normal.  Essential hypertension Mildly uncontrolled. Continue Toprol-XL &. Benazepril-HCTZ. When necessary IV hydralazine.  Mild hyperglycemia Probably related to steroids. Monitor CBGs and consider SSI. A1c 5.8.  Sinus tachycardia May have been related to brain lesion. Resolved. TSH normal. DC telemetry. Continue Toprol.    DVT prophylaxis: SCDs  Code Status: Full  Family Communication: Discussed in detail with patient's son and son-in-law at bedside. Disposition: To be determined.   Consultants:  Neurosurgery EDP discussed with oncology   Procedures:  None  Antimicrobials:  None    Subjective: Seen this morning. Denies headache. As per family at bedside, mental status including speech has been gradually improving. Interrupted but reasonable sleep overnight.  ROS: No pain reported.   Objective:  Vitals:   10/25/16 2100 10/26/16 0100 10/26/16 0501 10/26/16 1010  BP: 124/72 (!) 153/83 (!) 155/79 135/74  Pulse: 71 65 64 69  Resp: 18 18 18 18   Temp: 98.3 F (36.8 C) 97.5 F (36.4 C) 98.4 F (36.9 C) 98.1 F (36.7 C)  TempSrc: Oral Oral Oral Oral  SpO2: 93% 95% 94% 93%  Weight:      Height:        Examination:  General exam: Pleasant elderly female lying comfortably supine in bed. Respiratory system: Clear to auscultation. Respiratory effort normal. Cardiovascular system: S1 & S2 heard, RRR. No JVD, murmurs, rubs, gallops or  clicks. No pedal edema.Telemetry: Sinus rhythm.  Gastrointestinal system: Abdomen is nondistended, soft and  nontender. No organomegaly or masses felt. Normal bowel sounds heard. Central nervous system: Alert and oriented. Dysphasia +. Extremities: Symmetric 5 x 5 power. Skin: No rashes, lesions or ulcers Psychiatry: Judgement and insight appear normal. Mood & affect appropriate.     Data Reviewed: I have personally reviewed following labs and imaging studies  CBC:  Recent Labs Lab 10/22/16 1835 10/22/16 2111 10/23/16 0248 10/24/16 0435  WBC  --  6.8 6.6 11.3*  NEUTROABS  --  4.7  --   --   HGB 13.3 12.0 13.2 12.4  HCT 39.0 35.4* 39.9 37.5  MCV  --  85.7 88.9 88.4  PLT  --  149* 154 440   Basic Metabolic Panel:  Recent Labs Lab 10/22/16 1835 10/22/16 2111 10/23/16 0248 10/23/16 1003 10/24/16 0435  NA 141 140 140  --  139  K 3.4* 2.6* 3.0*  --  4.2  CL 107 109 105  --  104  CO2  --  24 21*  --  25  GLUCOSE 99 118* 156*  --  166*  BUN 11 9 6   --  12  CREATININE 0.70 0.64 0.71  --  0.74  CALCIUM  --  8.8* 8.7*  --  9.3  MG  --   --   --  1.8  --    Liver Function Tests:  Recent Labs Lab 10/22/16 2111 10/24/16 0435  AST 16 20  ALT 12* 13*  ALKPHOS 49 49  BILITOT 1.0 0.7  PROT 6.6 6.3*  ALBUMIN 3.8 3.1*   Coagulation Profile:  Recent Labs Lab 10/23/16 0248  INR 1.08   Cardiac Enzymes: No results for input(s): CKTOTAL, CKMB, CKMBINDEX, TROPONINI in the last 168 hours. HbA1C:  Recent Labs  10/24/16 1543  HGBA1C 5.8*   CBG:  Recent Labs Lab 10/25/16 1122 10/25/16 1634 10/25/16 2117 10/26/16 0625 10/26/16 1159  GLUCAP 144* 148* 166* 141* 145*    Recent Results (from the past 240 hour(s))  Culture, blood (x 2)     Status: None (Preliminary result)   Collection Time: 10/23/16  2:55 AM  Result Value Ref Range Status   Specimen Description BLOOD LEFT ANTECUBITAL  Final   Special Requests BOTTLES DRAWN AEROBIC AND ANAEROBIC 10CC EACH  Final   Culture NO GROWTH 2 DAYS  Final   Report Status PENDING  Incomplete  Culture, blood (x 2)     Status:  None (Preliminary result)   Collection Time: 10/23/16  3:04 AM  Result Value Ref Range Status   Specimen Description BLOOD LEFT HAND  Final   Special Requests BOTTLES DRAWN AEROBIC AND ANAEROBIC 10CC EACH  Final   Culture NO GROWTH 2 DAYS  Final   Report Status PENDING  Incomplete  Culture, Urine     Status: Abnormal   Collection Time: 10/24/16  8:25 AM  Result Value Ref Range Status   Specimen Description URINE, RANDOM  Final   Special Requests NONE  Final   Culture MULTIPLE SPECIES PRESENT, SUGGEST RECOLLECTION (A)  Final   Report Status 10/25/2016 FINAL  Final  MRSA PCR Screening     Status: None   Collection Time: 10/25/16  3:29 PM  Result Value Ref Range Status   MRSA by PCR NEGATIVE NEGATIVE Final    Comment:        The GeneXpert MRSA Assay (FDA approved for NASAL specimens only), is one component of a comprehensive  MRSA colonization surveillance program. It is not intended to diagnose MRSA infection nor to guide or monitor treatment for MRSA infections.          Radiology Studies: Mr Jeri Cos FX Contrast  Result Date: 10/24/2016 CLINICAL DATA:  Continued surveillance LEFT frontal mass. Brain lab protocol. EXAM: MRI HEAD WITHOUT AND WITH CONTRAST TECHNIQUE: Multiplanar, multiecho pulse sequences of the brain and surrounding structures were obtained without and with intravenous contrast. CONTRAST:  69mL MULTIHANCE GADOBENATE DIMEGLUMINE 529 MG/ML IV SOLN COMPARISON:  10/22/2016 MR.  10/22/2016 CT. FINDINGS: Brain: Ovoid 22 x 31 x 28 mm LEFT frontal mass, epicenter gray-white junction, marked restricted diffusion centrally, irregular thin and thick walled enhancement, adjacent abnormal dural enhancement, marked surrounding edema, all favoring brain abscess. Primary brain tumor or metastasis less favored. No cortical venous or sagittal sinus thrombosis. Some subfalcine herniation LEFT-to-RIGHT, approximately 2 mm. No significant change from priors. Diffusion tensor imaging  reveals the lesion is significantly anterior to the motor strip, approximately 5 cm cephalad of the central sulcus. Superior longitudinal fasciculus is mildly depressed in its anterior margin. Vascular: Normal flow voids. Skull and upper cervical spine: Normal marrow signal. Sinuses/Orbits: Negative. Other: None. IMPRESSION: Unchanged LEFT frontal mass, favored to represent a brain abscess. See discussion above. Electronically Signed   By: Staci Righter M.D.   On: 10/24/2016 15:20        Scheduled Meds: . benazepril  20 mg Oral Daily   And  . hydrochlorothiazide  25 mg Oral Daily  . dexamethasone  4 mg Intravenous Q6H  . insulin aspart  0-9 Units Subcutaneous TID WC  . levETIRAcetam  500 mg Oral BID  . metoprolol succinate  100 mg Oral QPM  . potassium chloride  20 mEq Oral Once   Continuous Infusions:    LOS: 4 days     Crystalyn Delia, MD, FACP, FHM. [facility] Pager [phone]  If 7PM-7AM, please contact night-coverage [email or web address] Password [facility] 10/26/2016, 12:14 PM

## 2016-10-26 NOTE — Progress Notes (Signed)
Patient ID: Alexandria [name], female   DOB: 11/26/1945, 71 y.o.   MRN: [id number] Subjective:  The patient is alert and pleasant. She is in no apparent distress. Family members are at the bedside with multiple questions.  Objective: Vital signs in last 24 hours: Temp:  [97.5 F (36.4 C)-98.4 F (36.9 C)] 98.1 F (36.7 C) (03/25 1010) Pulse Rate:  [64-71] 69 (03/25 1010) Resp:  [18] 18 (03/25 1010) BP: (124-155)/(68-85) 135/74 (03/25 1010) SpO2:  [91 %-95 %] 93 % (03/25 1010)  Intake/Output from previous day: No intake/output data recorded. Intake/Output this shift: No intake/output data recorded.  Physical exam the patient is alert and pleasant. She is moving all 4 extremities well. She remains a bit dysphasic.  Lab Results:  Recent Labs  10/24/16 0435  WBC 11.3*  HGB 12.4  HCT 37.5  PLT 169   BMET  Recent Labs  10/24/16 0435  NA 139  K 4.2  CL 104  CO2 25  GLUCOSE 166*  BUN 12  CREATININE 0.74  CALCIUM 9.3    Studies/Results: Mr Jeri Cos Wo Contrast  Result Date: 10/24/2016 CLINICAL DATA:  Continued surveillance LEFT frontal mass. Brain lab protocol. EXAM: MRI HEAD WITHOUT AND WITH CONTRAST TECHNIQUE: Multiplanar, multiecho pulse sequences of the brain and surrounding structures were obtained without and with intravenous contrast. CONTRAST:  9mL MULTIHANCE GADOBENATE DIMEGLUMINE 529 MG/ML IV SOLN COMPARISON:  10/22/2016 MR.  10/22/2016 CT. FINDINGS: Brain: Ovoid 22 x 31 x 28 mm LEFT frontal mass, epicenter gray-white junction, marked restricted diffusion centrally, irregular thin and thick walled enhancement, adjacent abnormal dural enhancement, marked surrounding edema, all favoring brain abscess. Primary brain tumor or metastasis less favored. No cortical venous or sagittal sinus thrombosis. Some subfalcine herniation LEFT-to-RIGHT, approximately 2 mm. No significant change from priors. Diffusion tensor imaging reveals the lesion is significantly anterior to the  motor strip, approximately 5 cm cephalad of the central sulcus. Superior longitudinal fasciculus is mildly depressed in its anterior margin. Vascular: Normal flow voids. Skull and upper cervical spine: Normal marrow signal. Sinuses/Orbits: Negative. Other: None. IMPRESSION: Unchanged LEFT frontal mass, favored to represent a brain abscess. See discussion above. Electronically Signed   By: Staci Righter M.D.   On: 10/24/2016 15:20    Assessment/Plan: Brain tumor: I have answered all the patient's family's questions to the best of my abilities. They want to speak with Dr. Vertell Limber tomorrow before surgery.  LOS: 4 days     Raunel Dimartino D 10/26/2016, 12:25 PM

## 2016-10-27 ENCOUNTER — Encounter (HOSPITAL_COMMUNITY): Payer: Self-pay | Admitting: Certified Registered"

## 2016-10-27 ENCOUNTER — Inpatient Hospital Stay (HOSPITAL_COMMUNITY): Payer: Medicare Other | Admitting: Certified Registered"

## 2016-10-27 ENCOUNTER — Encounter (HOSPITAL_COMMUNITY)
Admission: EM | Disposition: A | Discharge status: Rehabilitation Facility | Payer: Self-pay | Source: Home / Self Care | Attending: Internal Medicine

## 2016-10-27 DIAGNOSIS — G060 Intracranial abscess and granuloma: Principal | ICD-10-CM

## 2016-10-27 DIAGNOSIS — Z882 Allergy status to sulfonamides status: Secondary | ICD-10-CM

## 2016-10-27 DIAGNOSIS — R739 Hyperglycemia, unspecified: Secondary | ICD-10-CM

## 2016-10-27 DIAGNOSIS — B9689 Other specified bacterial agents as the cause of diseases classified elsewhere: Secondary | ICD-10-CM

## 2016-10-27 DIAGNOSIS — Z800 Family history of malignant neoplasm of digestive organs: Secondary | ICD-10-CM

## 2016-10-27 DIAGNOSIS — Z803 Family history of malignant neoplasm of breast: Secondary | ICD-10-CM

## 2016-10-27 HISTORY — PX: APPLICATION OF CRANIAL NAVIGATION: SHX6578

## 2016-10-27 HISTORY — PX: CRANIOTOMY: SHX93

## 2016-10-27 LAB — GLUCOSE, CAPILLARY
GLUCOSE-CAPILLARY: 180 mg/dL — AB (ref 65–99)
GLUCOSE-CAPILLARY: 148 mg/dL — AB (ref 65–99)
GLUCOSE-CAPILLARY: 202 mg/dL — AB (ref 65–99)
GLUCOSE-CAPILLARY: 188 mg/dL — AB (ref 65–99)
Glucose-Capillary: 176 mg/dL — ABNORMAL HIGH (ref 65–99)

## 2016-10-27 SURGERY — CRANIOTOMY TUMOR EXCISION
Anesthesia: General | Site: Head

## 2016-10-27 MED ORDER — BACITRACIN ZINC 500 UNIT/GM EX OINT
TOPICAL_OINTMENT | CUTANEOUS | Status: AC
  Filled 2016-10-27: qty 28.35

## 2016-10-27 MED ORDER — DEXAMETHASONE SODIUM PHOSPHATE 10 MG/ML IJ SOLN
INTRAMUSCULAR | Status: AC
  Filled 2016-10-27: qty 1

## 2016-10-27 MED ORDER — ACETAMINOPHEN 650 MG RE SUPP: 650.0000 mg | RECTAL | Status: DC | PRN

## 2016-10-27 MED ORDER — LABETALOL HCL 5 MG/ML IV SOLN
INTRAVENOUS | Status: AC
  Filled 2016-10-27: qty 4

## 2016-10-27 MED ORDER — VECURONIUM BROMIDE 10 MG IV SOLR
INTRAVENOUS | Status: DC | PRN
  Administered 2016-10-27: 2 mg via INTRAVENOUS
  Administered 2016-10-27 (×2): 1 mg via INTRAVENOUS

## 2016-10-27 MED ORDER — ONDANSETRON HCL 4 MG/2ML IJ SOLN
INTRAMUSCULAR | Status: AC
  Filled 2016-10-27: qty 2

## 2016-10-27 MED ORDER — CEFTRIAXONE SODIUM 2 G IJ SOLR
2.0000 g | Freq: Two times a day (BID) | INTRAMUSCULAR | Status: DC
  Administered 2016-10-27 – 2016-10-30 (×6): 2 g via INTRAVENOUS
  Filled 2016-10-27 (×7): qty 2

## 2016-10-27 MED ORDER — ONDANSETRON HCL 4 MG/2ML IJ SOLN: 4.0000 mg | INTRAMUSCULAR | Status: DC | PRN

## 2016-10-27 MED ORDER — MORPHINE SULFATE (PF) 2 MG/ML IV SOLN
1.0000 mg | INTRAVENOUS | Status: DC | PRN
  Administered 2016-10-28: 1 mg via INTRAVENOUS
  Filled 2016-10-27: qty 1

## 2016-10-27 MED ORDER — BUPIVACAINE HCL (PF) 0.5 % IJ SOLN
INTRAMUSCULAR | Status: DC | PRN
  Administered 2016-10-27: 5 mL

## 2016-10-27 MED ORDER — POTASSIUM CHLORIDE IN NACL 20-0.9 MEQ/L-% IV SOLN
INTRAVENOUS | Status: DC
  Administered 2016-10-27 – 2016-10-30 (×4): via INTRAVENOUS
  Filled 2016-10-27 (×8): qty 1000

## 2016-10-27 MED ORDER — PHENYLEPHRINE HCL 10 MG/ML IJ SOLN
INTRAVENOUS | Status: DC | PRN
  Administered 2016-10-27: 50 ug/min via INTRAVENOUS

## 2016-10-27 MED ORDER — FLEET ENEMA 7-19 GM/118ML RE ENEM: 1.0000 | ENEMA | Freq: Once | RECTAL | Status: DC | PRN

## 2016-10-27 MED ORDER — PROPOFOL 10 MG/ML IV BOLUS
INTRAVENOUS | Status: AC
  Filled 2016-10-27: qty 20

## 2016-10-27 MED ORDER — LIDOCAINE 2% (20 MG/ML) 5 ML SYRINGE
INTRAMUSCULAR | Status: AC
  Filled 2016-10-27: qty 5

## 2016-10-27 MED ORDER — ONDANSETRON HCL 4 MG PO TABS: 4.0000 mg | ORAL_TABLET | ORAL | Status: DC | PRN

## 2016-10-27 MED ORDER — BISACODYL 10 MG RE SUPP: 10.0000 mg | Freq: Every day | RECTAL | Status: DC | PRN

## 2016-10-27 MED ORDER — DEXTROSE 5 % IV SOLN
2.0000 g | INTRAVENOUS | Status: AC
  Administered 2016-10-27: 2 g via INTRAVENOUS
  Filled 2016-10-27: qty 2

## 2016-10-27 MED ORDER — VECURONIUM BROMIDE 10 MG IV SOLR
INTRAVENOUS | Status: AC
  Filled 2016-10-27: qty 10

## 2016-10-27 MED ORDER — HYDROMORPHONE HCL 1 MG/ML IJ SOLN
INTRAMUSCULAR | Status: AC
  Filled 2016-10-27: qty 0.5

## 2016-10-27 MED ORDER — ACETAMINOPHEN 325 MG PO TABS
650.0000 mg | ORAL_TABLET | ORAL | Status: DC | PRN
  Administered 2016-10-27: 650 mg via ORAL
  Filled 2016-10-27: qty 2

## 2016-10-27 MED ORDER — PROMETHAZINE HCL 25 MG PO TABS: 12.5000 mg | ORAL_TABLET | ORAL | Status: DC | PRN

## 2016-10-27 MED ORDER — SODIUM CHLORIDE 0.9 % IV SOLN
500.0000 mg | Freq: Two times a day (BID) | INTRAVENOUS | Status: DC
  Administered 2016-10-27 – 2016-10-29 (×4): 500 mg via INTRAVENOUS
  Filled 2016-10-27 (×5): qty 5

## 2016-10-27 MED ORDER — SUFENTANIL CITRATE 50 MCG/ML IV SOLN
INTRAVENOUS | Status: AC
Start: 2016-10-27 — End: 2016-10-27
  Filled 2016-10-27: qty 1

## 2016-10-27 MED ORDER — SUFENTANIL CITRATE 50 MCG/ML IV SOLN
INTRAVENOUS | Status: DC | PRN
  Administered 2016-10-27: 5 ug via INTRAVENOUS

## 2016-10-27 MED ORDER — HYDROMORPHONE HCL 1 MG/ML IJ SOLN
0.2500 mg | INTRAMUSCULAR | Status: DC | PRN
  Administered 2016-10-27 (×2): 0.25 mg via INTRAVENOUS

## 2016-10-27 MED ORDER — LIDOCAINE-EPINEPHRINE 2 %-1:100000 IJ SOLN
INTRAMUSCULAR | Status: DC | PRN
  Administered 2016-10-27: 5 mL

## 2016-10-27 MED ORDER — MEPERIDINE HCL 25 MG/ML IJ SOLN: 6.2500 mg | INTRAMUSCULAR | Status: DC | PRN

## 2016-10-27 MED ORDER — THROMBIN 20000 UNITS EX SOLR
CUTANEOUS | Status: DC | PRN
  Administered 2016-10-27: 09:00:00 via TOPICAL

## 2016-10-27 MED ORDER — PHENYLEPHRINE 40 MCG/ML (10ML) SYRINGE FOR IV PUSH (FOR BLOOD PRESSURE SUPPORT)
PREFILLED_SYRINGE | INTRAVENOUS | Status: DC | PRN
  Administered 2016-10-27: 80 ug via INTRAVENOUS

## 2016-10-27 MED ORDER — SUGAMMADEX SODIUM 200 MG/2ML IV SOLN
INTRAVENOUS | Status: DC | PRN
  Administered 2016-10-27: 200 mg via INTRAVENOUS

## 2016-10-27 MED ORDER — ONDANSETRON HCL 4 MG/2ML IJ SOLN
4.0000 mg | Freq: Once | INTRAMUSCULAR | Status: DC | PRN
Start: 2016-10-27 — End: 2016-10-27

## 2016-10-27 MED ORDER — LABETALOL HCL 5 MG/ML IV SOLN
INTRAVENOUS | Status: DC | PRN
  Administered 2016-10-27: 10 mg via INTRAVENOUS

## 2016-10-27 MED ORDER — VANCOMYCIN HCL 1000 MG IV SOLR
INTRAVENOUS | Status: DC | PRN
  Administered 2016-10-27: 1000 mg via INTRAVENOUS

## 2016-10-27 MED ORDER — SODIUM CHLORIDE 0.9 % IJ SOLN
INTRAMUSCULAR | Status: AC
  Filled 2016-10-27: qty 10

## 2016-10-27 MED ORDER — LIDOCAINE-EPINEPHRINE 2 %-1:100000 IJ SOLN
INTRAMUSCULAR | Status: AC
  Filled 2016-10-27: qty 1

## 2016-10-27 MED ORDER — THROMBIN 5000 UNITS EX SOLR
OROMUCOSAL | Status: DC | PRN
  Administered 2016-10-27: 09:00:00 via TOPICAL

## 2016-10-27 MED ORDER — BUPIVACAINE HCL (PF) 0.5 % IJ SOLN
INTRAMUSCULAR | Status: AC
  Filled 2016-10-27: qty 30

## 2016-10-27 MED ORDER — SODIUM CHLORIDE 0.9 % IR SOLN
Status: DC | PRN
  Administered 2016-10-27 (×3): 1000 mL

## 2016-10-27 MED ORDER — SODIUM CHLORIDE 0.9 % IV SOLN
INTRAVENOUS | Status: DC | PRN
  Administered 2016-10-27: 07:00:00 via INTRAVENOUS

## 2016-10-27 MED ORDER — DOCUSATE SODIUM 100 MG PO CAPS
100.0000 mg | ORAL_CAPSULE | Freq: Two times a day (BID) | ORAL | Status: DC
  Administered 2016-10-27 – 2016-10-29 (×5): 100 mg via ORAL
  Filled 2016-10-27 (×6): qty 1

## 2016-10-27 MED ORDER — HYDROCODONE-ACETAMINOPHEN 5-325 MG PO TABS
1.0000 | ORAL_TABLET | ORAL | Status: DC | PRN
  Administered 2016-10-28 – 2016-10-29 (×2): 1 via ORAL
  Filled 2016-10-27 (×3): qty 1

## 2016-10-27 MED ORDER — PROPOFOL 10 MG/ML IV BOLUS
INTRAVENOUS | Status: DC | PRN
  Administered 2016-10-27: 30 mg via INTRAVENOUS

## 2016-10-27 MED ORDER — POLYETHYLENE GLYCOL 3350 17 G PO PACK
17.0000 g | PACK | Freq: Every day | ORAL | Status: DC | PRN
  Filled 2016-10-27: qty 1

## 2016-10-27 MED ORDER — BACITRACIN ZINC 500 UNIT/GM EX OINT
TOPICAL_OINTMENT | CUTANEOUS | Status: DC | PRN
  Administered 2016-10-27 (×2): 1 via TOPICAL

## 2016-10-27 MED ORDER — THROMBIN 20000 UNITS EX SOLR
CUTANEOUS | Status: AC
  Filled 2016-10-27: qty 20000

## 2016-10-27 MED ORDER — DEXAMETHASONE SODIUM PHOSPHATE 10 MG/ML IJ SOLN
INTRAMUSCULAR | Status: DC | PRN
  Administered 2016-10-27: 10 mg via INTRAVENOUS

## 2016-10-27 MED ORDER — SUGAMMADEX SODIUM 200 MG/2ML IV SOLN
INTRAVENOUS | Status: AC
  Filled 2016-10-27: qty 2

## 2016-10-27 MED ORDER — PANTOPRAZOLE SODIUM 40 MG IV SOLR
40.0000 mg | Freq: Every day | INTRAVENOUS | Status: DC
  Administered 2016-10-27: 40 mg via INTRAVENOUS
  Filled 2016-10-27: qty 40

## 2016-10-27 MED ORDER — HEMOSTATIC AGENTS (NO CHARGE) OPTIME
TOPICAL | Status: DC | PRN
  Administered 2016-10-27: 1 via TOPICAL

## 2016-10-27 MED ORDER — SODIUM CHLORIDE 0.9 % IV SOLN
500.0000 mg | INTRAVENOUS | Status: AC
  Administered 2016-10-27: 500 mg via INTRAVENOUS
  Filled 2016-10-27: qty 5

## 2016-10-27 MED ORDER — VANCOMYCIN HCL IN DEXTROSE 1-5 GM/200ML-% IV SOLN
INTRAVENOUS | Status: AC
  Filled 2016-10-27: qty 200

## 2016-10-27 MED ORDER — THROMBIN 5000 UNITS EX SOLR
CUTANEOUS | Status: AC
  Filled 2016-10-27: qty 5000

## 2016-10-27 MED ORDER — METRONIDAZOLE IN NACL 5-0.79 MG/ML-% IV SOLN
500.0000 mg | Freq: Three times a day (TID) | INTRAVENOUS | Status: DC
  Administered 2016-10-27 – 2016-10-29 (×6): 500 mg via INTRAVENOUS
  Filled 2016-10-27 (×8): qty 100

## 2016-10-27 MED ORDER — LABETALOL HCL 5 MG/ML IV SOLN
10.0000 mg | INTRAVENOUS | Status: DC | PRN
  Administered 2016-10-27: 15 mg via INTRAVENOUS
  Administered 2016-10-27: 10 mg via INTRAVENOUS
  Administered 2016-10-28: 20 mg via INTRAVENOUS
  Administered 2016-10-28 – 2016-10-29 (×2): 10 mg via INTRAVENOUS
  Filled 2016-10-27 (×5): qty 4

## 2016-10-27 MED ORDER — ROCURONIUM BROMIDE 50 MG/5ML IV SOSY
PREFILLED_SYRINGE | INTRAVENOUS | Status: AC
  Filled 2016-10-27: qty 5

## 2016-10-27 SURGICAL SUPPLY — 83 items
BANDAGE GAUZE 4  KLING STR (GAUZE/BANDAGES/DRESSINGS) IMPLANT
BIT DRILL WIRE PASS 1.3MM (BIT) IMPLANT
BLADE CLIPPER SURG (BLADE) ×4 IMPLANT
BUR ACORN 6.0 PRECISION (BURR) ×3 IMPLANT
BUR ACORN 6.0MM PRECISION (BURR) ×1
BUR SPIRAL ROUTER 2.3 (BUR) ×3 IMPLANT
BUR SPIRAL ROUTER 2.3MM (BUR) ×1
CANISTER SUCT 3000ML PPV (MISCELLANEOUS) ×8 IMPLANT
CARTRIDGE OIL MAESTRO DRILL (MISCELLANEOUS) ×2 IMPLANT
CLIP TI MEDIUM 6 (CLIP) ×4 IMPLANT
CONT SPEC 4OZ CLIKSEAL STRL BL (MISCELLANEOUS) ×8 IMPLANT
COVER MAYO STAND STRL (DRAPES) IMPLANT
DECANTER SPIKE VIAL GLASS SM (MISCELLANEOUS) ×8 IMPLANT
DIFFUSER DRILL AIR PNEUMATIC (MISCELLANEOUS) ×4 IMPLANT
DRAPE MICROSCOPE LEICA (MISCELLANEOUS) IMPLANT
DRAPE NEUROLOGICAL W/INCISE (DRAPES) ×4 IMPLANT
DRAPE STERI IOBAN 125X83 (DRAPES) IMPLANT
DRAPE WARM FLUID 44X44 (DRAPE) ×4 IMPLANT
DRILL WIRE PASS 1.3MM (BIT)
DRSG TEGADERM 2-3/8X2-3/4 SM (GAUZE/BANDAGES/DRESSINGS) ×12 IMPLANT
DRSG TELFA 3X8 NADH (GAUZE/BANDAGES/DRESSINGS) ×4 IMPLANT
DURAMATRIX ONLAY 2X2 (Neuro Prosthesis/Implant) ×4 IMPLANT
DURAPREP 6ML APPLICATOR 50/CS (WOUND CARE) ×4 IMPLANT
ELECT REM PT RETURN 9FT ADLT (ELECTROSURGICAL) ×4
ELECTRODE REM PT RTRN 9FT ADLT (ELECTROSURGICAL) ×2 IMPLANT
EVACUATOR 1/8 PVC DRAIN (DRAIN) IMPLANT
EVACUATOR SILICONE 100CC (DRAIN) IMPLANT
FORCEPS BIPOLAR SPETZLER 8 1.0 (NEUROSURGERY SUPPLIES) ×4 IMPLANT
GAUZE SPONGE 4X4 12PLY STRL (GAUZE/BANDAGES/DRESSINGS) IMPLANT
GAUZE SPONGE 4X4 16PLY XRAY LF (GAUZE/BANDAGES/DRESSINGS) IMPLANT
GLOVE BIO SURGEON STRL SZ8 (GLOVE) ×8 IMPLANT
GLOVE BIOGEL PI IND STRL 8 (GLOVE) ×2 IMPLANT
GLOVE BIOGEL PI IND STRL 8.5 (GLOVE) ×2 IMPLANT
GLOVE BIOGEL PI INDICATOR 8 (GLOVE) ×2
GLOVE BIOGEL PI INDICATOR 8.5 (GLOVE) ×2
GLOVE ECLIPSE 8.0 STRL XLNG CF (GLOVE) ×4 IMPLANT
GLOVE EXAM NITRILE LRG STRL (GLOVE) IMPLANT
GLOVE EXAM NITRILE XL STR (GLOVE) IMPLANT
GLOVE EXAM NITRILE XS STR PU (GLOVE) IMPLANT
GOWN STRL REUS W/ TWL LRG LVL3 (GOWN DISPOSABLE) ×2 IMPLANT
GOWN STRL REUS W/ TWL XL LVL3 (GOWN DISPOSABLE) ×2 IMPLANT
GOWN STRL REUS W/TWL 2XL LVL3 (GOWN DISPOSABLE) ×4 IMPLANT
GOWN STRL REUS W/TWL LRG LVL3 (GOWN DISPOSABLE) ×2
GOWN STRL REUS W/TWL XL LVL3 (GOWN DISPOSABLE) ×2
HEMOSTAT POWDER KIT SURGIFOAM (HEMOSTASIS) ×4 IMPLANT
HEMOSTAT SURGICEL 2X14 (HEMOSTASIS) ×4 IMPLANT
KIT BASIN OR (CUSTOM PROCEDURE TRAY) ×4 IMPLANT
KIT ROOM TURNOVER OR (KITS) ×4 IMPLANT
MARKER SKIN DUAL TIP RULER LAB (MISCELLANEOUS) ×4 IMPLANT
MARKER SPHERE PSV REFLC 13MM (MARKER) ×8 IMPLANT
NEEDLE HYPO 25X1 1.5 SAFETY (NEEDLE) ×4 IMPLANT
NS IRRIG 1000ML POUR BTL (IV SOLUTION) ×12 IMPLANT
OIL CARTRIDGE MAESTRO DRILL (MISCELLANEOUS) ×4
PACK CRANIOTOMY (CUSTOM PROCEDURE TRAY) ×4 IMPLANT
PAD ARMBOARD 7.5X6 YLW CONV (MISCELLANEOUS) ×8 IMPLANT
PATTIES SURGICAL .25X.25 (GAUZE/BANDAGES/DRESSINGS) IMPLANT
PATTIES SURGICAL .5 X.5 (GAUZE/BANDAGES/DRESSINGS) IMPLANT
PATTIES SURGICAL .5 X3 (DISPOSABLE) IMPLANT
PATTIES SURGICAL 1/4 X 3 (GAUZE/BANDAGES/DRESSINGS) IMPLANT
PATTIES SURGICAL 1X1 (DISPOSABLE) IMPLANT
PIN MAYFIELD SKULL DISP (PIN) ×4 IMPLANT
PLATE 1.5  2HOLE LNG NEURO (Plate) ×2 IMPLANT
PLATE 1.5 2HOLE LNG NEURO (Plate) ×2 IMPLANT
PLATE 1.5/0.5 13MM BURR HOLE (Plate) ×4 IMPLANT
PLATE 1.5/0.5 18.5MM BURR HOLE (Plate) ×8 IMPLANT
RUBBERBAND STERILE (MISCELLANEOUS) IMPLANT
SCREW SELF DRILL HT 1.5/4MM (Screw) ×60 IMPLANT
SPECIMEN JAR SMALL (MISCELLANEOUS) IMPLANT
SPONGE NEURO XRAY DETECT 1X3 (DISPOSABLE) IMPLANT
SPONGE SURGIFOAM ABS GEL 100 (HEMOSTASIS) ×4 IMPLANT
STAPLER SKIN PROX WIDE 3.9 (STAPLE) ×8 IMPLANT
SUT ETHILON 3 0 FSL (SUTURE) IMPLANT
SUT NURALON 4 0 TR CR/8 (SUTURE) ×8 IMPLANT
SUT SILK 2 0 FS (SUTURE) IMPLANT
SUT VIC AB 2-0 CP2 18 (SUTURE) ×8 IMPLANT
SYR CONTROL 10ML LL (SYRINGE) ×4 IMPLANT
TOWEL GREEN STERILE (TOWEL DISPOSABLE) ×3 IMPLANT
TOWEL GREEN STERILE FF (TOWEL DISPOSABLE) ×4 IMPLANT
TRAY FOLEY W/METER SILVER 16FR (SET/KITS/TRAYS/PACK) ×4 IMPLANT
TUBE CONNECTING 12'X1/4 (SUCTIONS)
TUBE CONNECTING 12X1/4 (SUCTIONS) IMPLANT
UNDERPAD 30X30 (UNDERPADS AND DIAPERS) IMPLANT
WATER STERILE IRR 1000ML POUR (IV SOLUTION) ×4 IMPLANT

## 2016-10-27 NOTE — Progress Notes (Signed)
PROGRESS NOTE   Alexandria [name]  WHQ:[id number]    DOB: 19-Nov-1945    DOA: 10/22/2016  PCP: Osborne Casco, MD   I have briefly reviewed patients previous medical records in [facility].  Brief Narrative:  71 year old female, lives alone and independent of activities of daily living, PMH of remote history of breast cancer, HTN, presented to ED with altered mental status, difficulty communicating, confusion, left frontal temporal headache ongoing for few days prior to admission. In the ED CT head demonstrated masslike lesion in the left frontal lobe with surrounding vasogenic edema with localized mass effect. Oncology was concerned about malignancy and suggested MRI brain, steroids and neurosurgery consultation. MRI brain concerning for abscess versus malignancy. S/P Craniotomy and brain abscess evacuation 10/27/16.  Assessment & Plan:   Principal Problem:   Brain abscess Active Problems:   Acute encephalopathy   Hx of breast cancer   Essential hypertension   Hypokalemia   Hyperglycemia   Left frontal lobe ring-enhancing mass, brain abscess status post craniotomy 3/26 - CT head without contrast 10/22/16: Left frontal lobe mass lesion to by 7 x 2.5 cm with surrounding vasogenic edema, highly suspicious for neoplastic mass. Local mass effect within the left frontal lobe with associated mild rightward midline shift. No intracranial hemorrhage. - MRI brain 3/21: Irregular peripherally enhancing mass lesion in the anterior left frontal lobe measuring 2.6 x 3 x 2 cm with associated enhancement of the adjacent dura and central restricted diffusion most consistent with an abscess. Neoplasm considered less likely. -Surveillance CT chest abdomen and pelvis without acute lesions or lesions suspicious for malignancy. - Brain lab MRI 10/22/16: Results reviewed. - Neurosurgery consultation and follow-up appreciated. She has improved on IV Decadron. Although radiologist interpreted her  brain MRI is most consistent with abscess, given lack of leukocytosis and fever, surgeons are concerned about a primary brain tumor. Keppra initiated.  - S/P Craniotomy and brain abscess evacuation 10/27/16. Gram stain shows gram-positive cocci in pairs & chains. ID consultation appreciated and suspect that her solitary left frontal brain abscess was probably due to an episode of bacteremia and a. He initiated IV ceftriaxone and metronidazole for polymicrobial brain abscess. Follow final culture and pathology results. Blood cultures 2: Negative to date. - Postop admitted to neuro ICU on 3/26 and will likely be transferred out to medical floor on 3/27.  Acute encephalopathy - Secondary to brain lesion as above. On steroids. As per family, mental status improved but not at baseline and speech has clearly improved post surgery.  Hypokalemia Replaced. Magnesium normal.  Essential hypertension Mildly uncontrolled. Continue Toprol-XL &. Benazepril-HCTZ. When necessary IV hydralazine.  Mild hyperglycemia Probably related to steroids. Monitor CBGs and consider SSI. A1c 5.8.  Sinus tachycardia May have been related to brain lesion. Resolved. TSH normal. DC telemetry. Continue Toprol.    DVT prophylaxis: SCDs  Code Status: Full  Family Communication: Discussed in detail with patient's daughter at bedside. Disposition: Postop, was admitted to neuro ICU. Discharge disposition to determined.   Consultants:  Neurosurgery EDP discussed with oncology  Infectious Disease  Procedures:  S/P Craniotomy and brain abscess evacuation 10/27/16  Antimicrobials:  IV Ceftriaxone IV Metronidazole   Subjective: Patient had already gone to the OR this morning and hence saw patient postop in the neuro ICU. Patient denied complaints. She denied pain. As per daughter at bedside, speech is much more fluent post op.  ROS: No pain reported.   Objective:  Vitals:   10/27/16 1130 10/27/16 1140 10/27/16 1200  10/27/16 1230  BP: 129/89  130/80 128/84  Pulse: 66 66 75 65  Resp: 10 13 13 16   Temp:      TempSrc:      SpO2: 97% 97% 96% 97%  Weight:      Height:        Examination:  General exam: Pleasant elderly female lying comfortably supine in bed. Respiratory system: Clear to auscultation. Respiratory effort normal. Cardiovascular system: S1 & S2 heard, RRR. No JVD, murmurs, rubs, gallops or clicks. No pedal edema.Telemetry: Sinus rhythm.  Gastrointestinal system: Abdomen is nondistended, soft and nontender. No organomegaly or masses felt. Normal bowel sounds heard. Central nervous system: Alert and oriented. Dysphasia +. Speech more fluent. Extremities: Symmetric 5 x 5 power. Skin: No rashes, lesions or ulcers Psychiatry: Judgement and insight appear normal. Mood & affect appropriate.     Data Reviewed: I have personally reviewed following labs and imaging studies  CBC:  Recent Labs Lab 10/22/16 1835 10/22/16 2111 10/23/16 0248 10/24/16 0435  WBC  --  6.8 6.6 11.3*  NEUTROABS  --  4.7  --   --   HGB 13.3 12.0 13.2 12.4  HCT 39.0 35.4* 39.9 37.5  MCV  --  85.7 88.9 88.4  PLT  --  149* 154 144   Basic Metabolic Panel:  Recent Labs Lab 10/22/16 1835 10/22/16 2111 10/23/16 0248 10/23/16 1003 10/24/16 0435  NA 141 140 140  --  139  K 3.4* 2.6* 3.0*  --  4.2  CL 107 109 105  --  104  CO2  --  24 21*  --  25  GLUCOSE 99 118* 156*  --  166*  BUN 11 9 6   --  12  CREATININE 0.70 0.64 0.71  --  0.74  CALCIUM  --  8.8* 8.7*  --  9.3  MG  --   --   --  1.8  --    Liver Function Tests:  Recent Labs Lab 10/22/16 2111 10/24/16 0435  AST 16 20  ALT 12* 13*  ALKPHOS 49 49  BILITOT 1.0 0.7  PROT 6.6 6.3*  ALBUMIN 3.8 3.1*   Coagulation Profile:  Recent Labs Lab 10/23/16 0248  INR 1.08   Cardiac Enzymes: No results for input(s): CKTOTAL, CKMB, CKMBINDEX, TROPONINI in the last 168 hours. HbA1C:  Recent Labs  10/24/16 1543  HGBA1C 5.8*   CBG:  Recent  Labs Lab 10/26/16 1159 10/26/16 1645 10/26/16 2130 10/27/16 1034 10/27/16 1135  GLUCAP 145* 145* 150* 176* 180*    Recent Results (from the past 240 hour(s))  Culture, blood (x 2)     Status: None (Preliminary result)   Collection Time: 10/23/16  2:55 AM  Result Value Ref Range Status   Specimen Description BLOOD LEFT ANTECUBITAL  Final   Special Requests BOTTLES DRAWN AEROBIC AND ANAEROBIC 10CC EACH  Final   Culture NO GROWTH 3 DAYS  Final   Report Status PENDING  Incomplete  Culture, blood (x 2)     Status: None (Preliminary result)   Collection Time: 10/23/16  3:04 AM  Result Value Ref Range Status   Specimen Description BLOOD LEFT HAND  Final   Special Requests BOTTLES DRAWN AEROBIC AND ANAEROBIC 10CC EACH  Final   Culture NO GROWTH 3 DAYS  Final   Report Status PENDING  Incomplete  Culture, Urine     Status: Abnormal   Collection Time: 10/24/16  8:25 AM  Result Value Ref Range Status   Specimen Description URINE,  RANDOM  Final   Special Requests NONE  Final   Culture MULTIPLE SPECIES PRESENT, SUGGEST RECOLLECTION (A)  Final   Report Status 10/25/2016 FINAL  Final  MRSA PCR Screening     Status: None   Collection Time: 10/25/16  3:29 PM  Result Value Ref Range Status   MRSA by PCR NEGATIVE NEGATIVE Final    Comment:        The GeneXpert MRSA Assay (FDA approved for NASAL specimens only), is one component of a comprehensive MRSA colonization surveillance program. It is not intended to diagnose MRSA infection nor to guide or monitor treatment for MRSA infections.   Aerobic/Anaerobic Culture (surgical/deep wound)     Status: None (Preliminary result)   Collection Time: 10/27/16  8:48 AM  Result Value Ref Range Status   Specimen Description ABSCESS  Final   Special Requests LEFT FRONTAL LOBE BRAIN MASS  Final   Gram Stain   Final    ABUNDANT WBC PRESENT, PREDOMINANTLY PMN ABUNDANT GRAM POSITIVE COCCI IN PAIRS IN CHAINS CRITICAL RESULT CALLED TO, READ BACK BY AND  VERIFIED WITH: Valarie Merino AT 8110 10/27/16 BY L BENFIELD    Culture PENDING  Incomplete   Report Status PENDING  Incomplete  Aerobic/Anaerobic Culture (surgical/deep wound)     Status: None (Preliminary result)   Collection Time: 10/27/16  9:14 AM  Result Value Ref Range Status   Specimen Description TISSUE  Final   Special Requests LEFT FRONTAL LOBE BRAIN MASS  Final   Gram Stain   Final    MODERATE WBC PRESENT,BOTH PMN AND MONONUCLEAR RARE GRAM POSITIVE COCCI IN PAIRS CRITICAL RESULT CALLED TO, READ BACK BY AND VERIFIED WITH: P LOPEZ,RN AT 1013 10/27/16 BY L BENFIELD    Culture PENDING  Incomplete   Report Status PENDING  Incomplete         Radiology Studies: No results found.      Scheduled Meds: . benazepril  20 mg Oral Daily   And  . hydrochlorothiazide  25 mg Oral Daily  . cefTRIAXone (ROCEPHIN)  IV  2 g Intravenous Q12H  . dexamethasone  4 mg Intravenous Q6H  . docusate sodium  100 mg Oral BID  . HYDROmorphone      . insulin aspart  0-9 Units Subcutaneous TID WC  . levETIRAcetam  500 mg Intravenous Q12H  . metoprolol succinate  100 mg Oral QPM  . metronidazole  500 mg Intravenous Q8H  . pantoprazole (PROTONIX) IV  40 mg Intravenous QHS  . vancomycin       Continuous Infusions: . 0.9 % NaCl with KCl 20 mEq / L 75 mL/hr at 10/27/16 1221     LOS: 5 days     HONGALGI,ANAND, MD, FACP, FHM. [facility] Pager [phone]  If 7PM-7AM, please contact night-coverage [email or web address] Password [email or web address] 10/27/2016, 1:52 PM

## 2016-10-27 NOTE — Interval H&P Note (Signed)
History and Physical Interval Note:  10/27/2016 7:22 AM  Alexandria [name]  has presented today for surgery, with the diagnosis of BRAIN TUMOR  The various methods of treatment have been discussed with the patient and family. After consideration of risks, benefits and other options for treatment, the patient has consented to  Procedure(s): CRANIOTOMY TUMOR EXCISION (Left) APPLICATION OF CRANIAL NAVIGATION (N/A) as a surgical intervention .  The patient's history has been reviewed, patient examined, no change in status, stable for surgery.  I have reviewed the patient's chart and labs.  Questions were answered to the patient's satisfaction.     Xadrian Craighead D

## 2016-10-27 NOTE — Progress Notes (Signed)
Awake, alert, conversant.  Speech seems more fluent.  Able to answer questions and describe current situation.  Says "it's all good."  PERRL, EOMI, MAEW without drift.  No apparent weakness or numbness.  Doing well.

## 2016-10-27 NOTE — Anesthesia Preprocedure Evaluation (Addendum)
Anesthesia Evaluation  Patient identified by MRN, date of birth, ID band Patient awake    Reviewed: Allergy & Precautions, NPO status , Patient's Chart, lab work & pertinent test results  Airway Mallampati: II  TM Distance: >3 FB Neck ROM: Full    Dental  (+) Teeth Intact, Dental Advisory Given   Pulmonary    Pulmonary exam normal        Cardiovascular hypertension, Pt. on medications Normal cardiovascular exam     Neuro/Psych    GI/Hepatic   Endo/Other    Renal/GU      Musculoskeletal   Abdominal   Peds  Hematology   Anesthesia Other Findings   Reproductive/Obstetrics                            Anesthesia Physical Anesthesia Plan  ASA: III  Anesthesia Plan: General   Post-op Pain Management:    Induction: Intravenous  Airway Management Planned: Oral ETT  Additional Equipment: Arterial line  Intra-op Plan:   Post-operative Plan: Extubation in OR  Informed Consent: I have reviewed the patients History and Physical, chart, labs and discussed the procedure including the risks, benefits and alternatives for the proposed anesthesia with the patient or authorized representative who has indicated his/her understanding and acceptance.     Plan Discussed with: CRNA and Surgeon  Anesthesia Plan Comments:         Anesthesia Quick Evaluation

## 2016-10-27 NOTE — Op Note (Signed)
10/22/2016 - 10/27/2016  9:58 AM  PATIENT:  Alexandria [name]  71 y.o. 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  POST-OPERATIVE DIAGNOSIS:  brain abscess  PROCEDURE:  Procedure(s): CRANIOTOMY TUMOR EXCISION (Left) APPLICATION OF CRANIAL NAVIGATION (N/A)  SURGEON:  Surgeon(s) and Role:    * Erline Levine, MD - Primary  PHYSICIAN ASSISTANT:   ASSISTANTS: Poteat, RN   ANESTHESIA:   general  EBL:  Total I/O In: 800 [I.V.:800] Out: 550 [Urine:500; Blood:50]  BLOOD ADMINISTERED:none  DRAINS: none   LOCAL MEDICATIONS USED:  MARCAINE    and LIDOCAINE   SPECIMEN:  Excision  DISPOSITION OF SPECIMEN:  Microbiology  COUNTS:  YES  TOURNIQUET:  * No tourniquets in log *  DICTATION: Patient is 71 year old man with newly diagnosed brain mass in left frontal region with new onset aphasia.  It was elected to take her to surgery for craniotomy for left frontal brain tumor.  Diagnosis of brain abscess was favored by Neuro Radiology and was in the differential diagnosis for this lesion. Metastatic workup with chest/abdomen/pelvis CT scans was negative.  She had preop MRI for use of Curve for surgical localization of tumor.  Procedure:  Following smooth intubation, patient was placed in supine position with bump under left side.  Head was placed in pins and left frontal scalp was shaved and prepped and draped in usual sterile fashion after Curve MRI was localized to map location of brain mass.  Area of planned incision was infiltrated with lidocaine. A linear parasagittal incision was made and carried through temporalis fascia and muscle to expose calvarium.  Skull flap was elevated exposing the dura directly overlying the brain mass.  Dura was opened.  Purulent material was immediately encountered at the surface of the brain with yellowish discoloration of local meninges and thickening of surrounding meninges.  A corticotomy was created overlying the brain  mass and approximately 6 cc of frank pus was removed.  The gliotic lining was then removed and sent to Pathology and Microbiology for further analysis.  The Curve was used to confirm extent of resection.  Hemostasis was assured with irrigation, Surgifoam and cotton balls.The dura was closed with 4-0 neurilon sutures along with onlay Dura Matrix onlay graft was placed. The bone flap was replaced with plates, the fascia and galea were closed with 2-0 vicryl sutures and the skin was re approximated with staples.  A sterile occlusive dressing was placed.  Patient was returned to a supine position and taken out of head pins, then extubated in the operating room, having tolerated surgery well.  Counts were correct at the end of the case.  Preliminary Gram stain results were positive for Gram positive cocci in chains.  PLAN OF CARE: Admit to inpatient   PATIENT DISPOSITION:  PACU - hemodynamically stable.   Delay start of Pharmacological VTE agent (>24hrs) due to surgical blood loss or risk of bleeding: yes

## 2016-10-27 NOTE — Transfer of Care (Signed)
Immediate Anesthesia Transfer of Care Note  Patient: Alexandria [name]  Procedure(s) Performed: Procedure(s): CRANIOTOMY TUMOR EXCISION (Left) APPLICATION OF CRANIAL NAVIGATION (N/A)  Patient Location: PACU  Anesthesia Type:General  Level of Consciousness: awake and patient cooperative  Airway & Oxygen Therapy: Patient Spontanous Breathing and Patient connected to nasal cannula oxygen  Post-op Assessment: Report given to RN, Post -op Vital signs reviewed and stable and Patient moving all extremities  Post vital signs: Reviewed and stable  Last Vitals:  Vitals:   10/27/16 0059 10/27/16 0515  BP: (!) 148/79 (!) 142/88  Pulse: (!) 58 67  Resp: 18 18  Temp: 36.3 C 36.4 C    Last Pain:  Vitals:   10/27/16 0515  TempSrc: Oral  PainSc:          Complications: No apparent anesthesia complications

## 2016-10-27 NOTE — Brief Op Note (Signed)
10/22/2016 - 10/27/2016  9:58 AM  PATIENT:  Alexandria [name]  71 y.o. 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  POST-OPERATIVE DIAGNOSIS:  brain abscess  PROCEDURE:  Procedure(s): CRANIOTOMY TUMOR EXCISION (Left) APPLICATION OF CRANIAL NAVIGATION (N/A)  SURGEON:  Surgeon(s) and Role:    * Erline Levine, MD - Primary  PHYSICIAN ASSISTANT:   ASSISTANTS: Poteat, RN   ANESTHESIA:   general  EBL:  Total I/O In: 800 [I.V.:800] Out: 550 [Urine:500; Blood:50]  BLOOD ADMINISTERED:none  DRAINS: none   LOCAL MEDICATIONS USED:  MARCAINE    and LIDOCAINE   SPECIMEN:  Excision  DISPOSITION OF SPECIMEN:  Microbiology  COUNTS:  YES  TOURNIQUET:  * No tourniquets in log *  DICTATION: Patient is 71 year old man with newly diagnosed brain mass in left frontal region with new onset aphasia.  It was elected to take her to surgery for craniotomy for left frontal brain tumor.  Diagnosis of brain abscess was favored by Neuro Radiology and was in the differential diagnosis for this lesion. Metastatic workup with chest/abdomen/pelvis CT scans was negative.  She had preop MRI for use of Curve for surgical localization of tumor.  Procedure:  Following smooth intubation, patient was placed in supine position with bump under left side.  Head was placed in pins and left frontal scalp was shaved and prepped and draped in usual sterile fashion after Curve MRI was localized to map location of brain mass.  Area of planned incision was infiltrated with lidocaine. A linear parasagittal incision was made and carried through temporalis fascia and muscle to expose calvarium.  Skull flap was elevated exposing the dura directly overlying the brain mass.  Dura was opened.  Purulent material was immediately encountered at the surface of the brain with yellowish discoloration of local meninges and thickening of surrounding meninges.  A corticotomy was created overlying the brain  mass and approximately 6 cc of frank pus was removed.  The gliotic lining was then removed and sent to Pathology and Microbiology for further analysis.  The Curve was used to confirm extent of resection.  Hemostasis was assured with irrigation, Surgifoam and cotton balls.The dura was closed with 4-0 neurilon sutures along with onlay Dura Matrix onlay graft was placed. The bone flap was replaced with plates, the fascia and galea were closed with 2-0 vicryl sutures and the skin was re approximated with staples.  A sterile occlusive dressing was placed.  Patient was returned to a supine position and taken out of head pins, then extubated in the operating room, having tolerated surgery well.  Counts were correct at the end of the case.  Preliminary Gram stain results were positive for Gram positive cocci in chains.  PLAN OF CARE: Admit to inpatient   PATIENT DISPOSITION:  PACU - hemodynamically stable.   Delay start of Pharmacological VTE agent (>24hrs) due to surgical blood loss or risk of bleeding: yes

## 2016-10-27 NOTE — H&P (View-Only) (Signed)
Subjective: Patient reports still confused with expressive aphasia.  Objective: Vital signs in last 24 hours: Temp:  [98.1 F (36.7 C)-98.9 F (37.2 C)] 98.1 F (36.7 C) (03/22 1809) Pulse Rate:  [92-114] 92 (03/22 1809) Resp:  [16-25] 18 (03/22 1809) BP: (136-176)/(82-132) 145/82 (03/22 1809) SpO2:  [92 %-96 %] 93 % (03/22 1809) Weight:  [74.9 kg (165 lb 2 oz)] 74.9 kg (165 lb 2 oz) (03/22 0143)  Intake/Output from previous day: 03/21 0701 - 03/22 0700 In: 1632.5 [P.O.:480; I.V.:152.5; IV Piggyback:1000] Out: 375 [Urine:375] Intake/Output this shift: No intake/output data recorded.  Physical Exam: Awake, alert, states name, age, not able to relate more complex issues, but seems to understand what is going on.  Exam is consistent with expressive aphasia.  No weakness or drift.  Patient denies headache.  Lab Results:  Recent Labs  10/22/16 2111 10/23/16 0248  WBC 6.8 6.6  HGB 12.0 13.2  HCT 35.4* 39.9  PLT 149* 154   BMET  Recent Labs  10/22/16 2111 10/23/16 0248  NA 140 140  K 2.6* 3.0*  CL 109 105  CO2 24 21*  GLUCOSE 118* 156*  BUN 9 6  CREATININE 0.64 0.71  CALCIUM 8.8* 8.7*    Studies/Results: Dg Chest 2 View  Result Date: 10/22/2016 CLINICAL DATA:  Increased confusion history of brain cancer EXAM: CHEST  2 VIEW COMPARISON:  None. FINDINGS: Surgical clips in the right axilla. There is a oval retrocardiac opacity which presumably represents a large hiatal hernia. Cannot exclude atelectasis or infiltrate at the left base. No pneumothorax. IMPRESSION: 1. Difficult to exclude atelectasis or infiltrate at the left lung base 2. Retrocardiac opacity likely reflects a moderate to large hiatal hernia. Electronically Signed   By: Donavan Foil M.D.   On: 10/22/2016 21:32   Ct Head Wo Contrast  Result Date: 10/22/2016 CLINICAL DATA:  Altered mental status since yesterday. EXAM: CT HEAD WITHOUT CONTRAST TECHNIQUE: Contiguous axial images were obtained from the base  of the skull through the vertex without intravenous contrast. COMPARISON:  None. FINDINGS: Brain: Masslike lesion within the peripheral aspects of the left frontal lobe, measuring approximately 2.7 x 2.5 cm, with surrounding vasogenic edema. Associated local mass effect with sulcal effacement and slight rightward midline shift of the anterior falx. No intracranial hemorrhage seen. Vascular: There are chronic calcified atherosclerotic changes of the large vessels at the skull base. No unexpected hyperdense vessel. Skull: Normal. Negative for fracture or focal lesion. Sinuses/Orbits: No acute finding. Other: None. IMPRESSION: 1. Masslike lesion within the left frontal lobe, measuring approximately 2.7 x 2.5 cm, with surrounding vasogenic edema, highly suspicious for neoplastic mass. Ordering physician tells me that patient has a history of breast cancer, therefore, this is a suspected intracranial metastasis. Recommend MRI brain with contrast for further characterization. 2. Local mass effect within the left frontal lobe with an associated mild rightward midline shift of the anterior falx. 3. No intracranial hemorrhage seen. These results were called by telephone at the time of interpretation on 10/22/2016 at 4:55 pm to Dr. Domenic Moras , who verbally acknowledged these results. Electronically Signed   By: Franki Cabot M.D.   On: 10/22/2016 16:59   Ct Chest W Contrast  Result Date: 10/23/2016 CLINICAL DATA:  Sepsis. Pt denies chest pain or sob. Pt denies abd pain or complaints. EXAM: CT CHEST, ABDOMEN, AND PELVIS WITH CONTRAST TECHNIQUE: Multidetector CT imaging of the chest, abdomen and pelvis was performed following the standard protocol during bolus administration of intravenous contrast.  CONTRAST:  18mL ISOVUE-300 IOPAMIDOL (ISOVUE-300) INJECTION 61% COMPARISON:  None. FINDINGS: CT CHEST FINDINGS Cardiovascular: Heart is normal size. Aortic calcifications. No aneurysm. Scattered coronary artery calcifications.  Mediastinum/Nodes: No mediastinal, hilar, or axillary adenopathy. Large hiatal hernia containing much of the stomach. Trachea is unremarkable. Thyroid and thoracic inlet unremarkable. Lungs/Pleura: Linear platelike scarring or atelectasis in the lower lobes adjacent to the large hiatal hernia. No effusions. No suspicious nodules. Musculoskeletal: No acute bony abnormality. CT ABDOMEN PELVIS FINDINGS Hepatobiliary: Large central cyst in the liver measuring 12.5 x 11 cm. Other smaller scattered cysts. Gallbladder grossly unremarkable. Pancreas: No focal abnormality or ductal dilatation. Spleen: No focal abnormality.  Normal size. Adrenals/Urinary Tract: Small renal cysts bilaterally. No hydronephrosis or suspicious mass. Adrenal glands and urinary bladder unremarkable. Stomach/Bowel: Large and small bowel decompressed, unremarkable. Vascular/Lymphatic: No evidence of aneurysm or adenopathy. Diffuse aortic and iliac calcifications. Reproductive: Prior hysterectomy.  No adnexal masses. Other: No free fluid or free air. Musculoskeletal: No acute bony abnormality. Hemangioma in the L1 vertebral body. IMPRESSION: No acute findings in the chest, abdomen or pelvis. Large hiatal hernia. Compressive atelectasis or scarring in the lower lobes bilaterally. Aortic atherosclerosis, scattered coronary artery disease. Hepatic and renal cysts. Electronically Signed   By: Rolm Baptise M.D.   On: 10/23/2016 08:42   Mr Jeri Cos VF Contrast  Result Date: 10/22/2016 CLINICAL DATA:  Altered mental status beginning yesterday. Increased confusion. Abnormal CT scan frontal brain mass. EXAM: MRI HEAD WITHOUT AND WITH CONTRAST TECHNIQUE: Multiplanar, multiecho pulse sequences of the brain and surrounding structures were obtained without and with intravenous contrast. CONTRAST:  15 mL MultiHance COMPARISON:  CT head without contrast from the same day. FINDINGS: Brain: A peripherally enhancing left frontal lesion demonstrates a somewhat  irregular thickened periphery. There is some T1 shortening along the rim of the lesion as well. The central contents demonstrate restricted diffusion. There is marked surrounding edema results in mass effect and effacement of the adjacent sulci. Minimal midline shift is noted. The enhancement involves the adjacent aura extends along the anterior left frontal convexity. No other focal areas of restricted diffusion or enhancement are present. There are is no acute hemorrhage or other mass lesion. No acute infarct is present. The internal auditory canals are within normal limits bilaterally. The brainstem and cerebellum are normal. Vascular: Flow is present in the major intracranial arteries. Skull and upper cervical spine: The skullbase is within normal limits. Midline sagittal structures are unremarkable. The craniocervical junction is normal. Marrow signal is normal. The upper cervical spine is within normal limits. Sinuses/Orbits: Mild mucosal thickening is present within the ethmoid air cells bilaterally. The remaining paranasal sinuses and mastoid air cells are clear. IMPRESSION: 1. Irregular peripherally enhancing mass lesion in the anterior left frontal lobe measuring at 2.6 x 3.0 x 2.0 cm with associated enhancement of the adjacent dura and central restricted diffusion is most consistent with an abscess. Neoplasm is considered much less likely. 2. No other discrete lesions. 3. Minimal ethmoid sinus disease. These results were called by telephone at the time of interpretation on 10/22/2016 at 8:33 pm to [facility] PA , who verbally acknowledged these results. Electronically Signed   By: San Morelle M.D.   On: 10/22/2016 20:37   Ct Abdomen Pelvis W Contrast  Result Date: 10/23/2016 CLINICAL DATA:  Sepsis. Pt denies chest pain or sob. Pt denies abd pain or complaints. EXAM: CT CHEST, ABDOMEN, AND PELVIS WITH CONTRAST TECHNIQUE: Multidetector CT imaging of the chest, abdomen and pelvis  was performed  following the standard protocol during bolus administration of intravenous contrast. CONTRAST:  176mL ISOVUE-300 IOPAMIDOL (ISOVUE-300) INJECTION 61% COMPARISON:  None. FINDINGS: CT CHEST FINDINGS Cardiovascular: Heart is normal size. Aortic calcifications. No aneurysm. Scattered coronary artery calcifications. Mediastinum/Nodes: No mediastinal, hilar, or axillary adenopathy. Large hiatal hernia containing much of the stomach. Trachea is unremarkable. Thyroid and thoracic inlet unremarkable. Lungs/Pleura: Linear platelike scarring or atelectasis in the lower lobes adjacent to the large hiatal hernia. No effusions. No suspicious nodules. Musculoskeletal: No acute bony abnormality. CT ABDOMEN PELVIS FINDINGS Hepatobiliary: Large central cyst in the liver measuring 12.5 x 11 cm. Other smaller scattered cysts. Gallbladder grossly unremarkable. Pancreas: No focal abnormality or ductal dilatation. Spleen: No focal abnormality.  Normal size. Adrenals/Urinary Tract: Small renal cysts bilaterally. No hydronephrosis or suspicious mass. Adrenal glands and urinary bladder unremarkable. Stomach/Bowel: Large and small bowel decompressed, unremarkable. Vascular/Lymphatic: No evidence of aneurysm or adenopathy. Diffuse aortic and iliac calcifications. Reproductive: Prior hysterectomy.  No adnexal masses. Other: No free fluid or free air. Musculoskeletal: No acute bony abnormality. Hemangioma in the L1 vertebral body. IMPRESSION: No acute findings in the chest, abdomen or pelvis. Large hiatal hernia. Compressive atelectasis or scarring in the lower lobes bilaterally. Aortic atherosclerosis, scattered coronary artery disease. Hepatic and renal cysts. Electronically Signed   By: Rolm Baptise M.D.   On: 10/23/2016 08:42    Assessment/Plan: Patient has left frontal ring-enhancing mass.  She has undergone a metastatic workup which has, to date, been negative.  Patient has improved with decadron.  The radiologist interpreted her brain  MRI as most consistent with abscess, but her WBC is normal (6.8) and she has not had a fever.  I am concerned that this may instead represent a primary brain tumor.  I will start her on keppra, continue decadron, proceed with Brainlab MRI of brain for surgical planning and proceed with surgery early next week.  Patient and her children are agreeable with this plan and have decided to stay locally for treatment rather than to be tranfserred to [facility].    LOS: 1 day    Peggyann Shoals, MD 10/23/2016, 8:23 PM

## 2016-10-27 NOTE — Consult Note (Signed)
[place] for Infectious Disease    Date of Admission:  10/22/2016          Reason for Consult: Brain abscess    Referring Physician: Dr. Dierdre Harness  Principal Problem:   Brain abscess Active Problems:   Acute encephalopathy   Hx of breast cancer   Essential hypertension   Hypokalemia   Hyperglycemia   . benazepril  20 mg Oral Daily   And  . hydrochlorothiazide  25 mg Oral Daily  . dexamethasone  4 mg Intravenous Q6H  . docusate sodium  100 mg Oral BID  . HYDROmorphone      . insulin aspart  0-9 Units Subcutaneous TID WC  . levETIRAcetam  500 mg Intravenous Q12H  . metoprolol succinate  100 mg Oral QPM  . pantoprazole (PROTONIX) IV  40 mg Intravenous QHS  . vancomycin        Recommendations: 1. Start IV ceftriaxone and metronidazole pending abscess cultures 2. PICC placement   Assessment: She has a solitary left frontal brain abscess. She probably had an episode of bacteremia leading to this infection. Scans did not reveal any obvious sinusitis, otitis or other contiguous infection. Many brain abscesses are polymicrobial. I will treat her with ceftriaxone and metronidazole pending final cultures. She will need at least 6 weeks of antibiotic therapy. I will order a PICC. I have discussed the situation and these recommendations with her daughter at length.    HPI: Alexandria [name] is a 71 y.o. female who began to develop odd behavior and altered speech one week ago. This progressed to confusion leading to admission. CT and MRI revealed a left frontal mass suggestive of a brain abscess. She underwent surgery today and pus was drained. Gram stain shows gram-positive cocci in pairs and chains compatible with Streptococcus. Her daughter states that her speech is already improved since prior to surgery.   Review of Systems: Review of Systems  Unable to perform ROS: Mental acuity    Past Medical History:  Diagnosis Date  . Cancer [facility]) 1996   breast cx    . Hypertension     Social History  Substance Use Topics  . Smoking status: Never Smoker  . Smokeless tobacco: Never Used  . Alcohol use Yes     Comment: occasional    Family History  Problem Relation Age of Onset  . Breast cancer Mother   . Colon cancer Father    Allergies  Allergen Reactions  . Sulfa Antibiotics     OBJECTIVE: Blood pressure 128/84, pulse 65, temperature 97.7 F (36.5 C), temperature source Oral, resp. rate 16, height 5\' 5"  (1.651 m), weight 165 lb 2 oz (74.9 kg), SpO2 97 %.  Physical Exam  Constitutional:  She is alert and smiling resting quietly in bed.  HENT:  Mouth/Throat: No oropharyngeal exudate.  Cardiovascular: Normal rate and regular rhythm.   No murmur heard. Pulmonary/Chest: Effort normal and breath sounds normal. She has no wheezes. She has no rales.  Abdominal: Soft. There is no tenderness.  Musculoskeletal: Normal range of motion. She exhibits no edema or tenderness.  Neurological: She is alert.  Skin: No rash noted.    Lab Results Lab Results  Component Value Date   WBC 11.3 (H) 10/24/2016   HGB 12.4 10/24/2016   HCT 37.5 10/24/2016   MCV 88.4 10/24/2016   PLT 169 10/24/2016    Lab Results  Component Value Date   CREATININE 0.74 10/24/2016  BUN 12 10/24/2016   NA 139 10/24/2016   K 4.2 10/24/2016   CL 104 10/24/2016   CO2 25 10/24/2016    Lab Results  Component Value Date   ALT 13 (L) 10/24/2016   AST 20 10/24/2016   ALKPHOS 49 10/24/2016   BILITOT 0.7 10/24/2016     Microbiology: Recent Results (from the past 240 hour(s))  Culture, blood (x 2)     Status: None (Preliminary result)   Collection Time: 10/23/16  2:55 AM  Result Value Ref Range Status   Specimen Description BLOOD LEFT ANTECUBITAL  Final   Special Requests BOTTLES DRAWN AEROBIC AND ANAEROBIC 10CC EACH  Final   Culture NO GROWTH 3 DAYS  Final   Report Status PENDING  Incomplete  Culture, blood (x 2)     Status: None (Preliminary result)    Collection Time: 10/23/16  3:04 AM  Result Value Ref Range Status   Specimen Description BLOOD LEFT HAND  Final   Special Requests BOTTLES DRAWN AEROBIC AND ANAEROBIC 10CC EACH  Final   Culture NO GROWTH 3 DAYS  Final   Report Status PENDING  Incomplete  Culture, Urine     Status: Abnormal   Collection Time: 10/24/16  8:25 AM  Result Value Ref Range Status   Specimen Description URINE, RANDOM  Final   Special Requests NONE  Final   Culture MULTIPLE SPECIES PRESENT, SUGGEST RECOLLECTION (A)  Final   Report Status 10/25/2016 FINAL  Final  MRSA PCR Screening     Status: None   Collection Time: 10/25/16  3:29 PM  Result Value Ref Range Status   MRSA by PCR NEGATIVE NEGATIVE Final    Comment:        The GeneXpert MRSA Assay (FDA approved for NASAL specimens only), is one component of a comprehensive MRSA colonization surveillance program. It is not intended to diagnose MRSA infection nor to guide or monitor treatment for MRSA infections.   Aerobic/Anaerobic Culture (surgical/deep wound)     Status: None (Preliminary result)   Collection Time: 10/27/16  8:48 AM  Result Value Ref Range Status   Specimen Description ABSCESS  Final   Special Requests LEFT FRONTAL LOBE BRAIN MASS  Final   Gram Stain   Final    ABUNDANT WBC PRESENT, PREDOMINANTLY PMN ABUNDANT GRAM POSITIVE COCCI IN PAIRS IN CHAINS CRITICAL RESULT CALLED TO, READ BACK BY AND VERIFIED WITH: Valarie Merino AT 1194 10/27/16 BY L BENFIELD    Culture PENDING  Incomplete   Report Status PENDING  Incomplete  Aerobic/Anaerobic Culture (surgical/deep wound)     Status: None (Preliminary result)   Collection Time: 10/27/16  9:14 AM  Result Value Ref Range Status   Specimen Description TISSUE  Final   Special Requests LEFT FRONTAL LOBE BRAIN MASS  Final   Gram Stain   Final    MODERATE WBC PRESENT,BOTH PMN AND MONONUCLEAR RARE GRAM POSITIVE COCCI IN PAIRS CRITICAL RESULT CALLED TO, READ BACK BY AND VERIFIED WITH: P LOPEZ,RN AT 1013  10/27/16 BY L BENFIELD    Culture PENDING  Incomplete   Report Status PENDING  Incomplete    Michel Bickers, MD [place] [facility] Infect[facility] [phone] pager   [phone] cell 10/27/2016, 1:05 PM

## 2016-10-27 NOTE — Anesthesia Procedure Notes (Signed)
Procedure Name: Intubation Date/Time: 10/27/2016 7:47 AM Performed by: Melina Copa, Jerre Vandrunen R Pre-anesthesia Checklist: Patient identified, Emergency Drugs available, Suction available and Patient being monitored Patient Re-evaluated:Patient Re-evaluated prior to inductionOxygen Delivery Method: Circle System Utilized Preoxygenation: Pre-oxygenation with 100% oxygen Intubation Type: IV induction Ventilation: Mask ventilation without difficulty Laryngoscope Size: Mac and 3 Grade View: Grade I Tube type: Oral Tube size: 7.5 mm Number of attempts: 1 Airway Equipment and Method: Stylet and Oral airway Placement Confirmation: ETT inserted through vocal cords under direct vision,  positive ETCO2 and breath sounds checked- equal and bilateral Secured at: 21 cm Tube secured with: Tape Dental Injury: Teeth and Oropharynx as per pre-operative assessment

## 2016-10-27 NOTE — Anesthesia Postprocedure Evaluation (Signed)
Anesthesia Post Note  Patient: Aubreanna Percle Lenahan  Procedure(s) Performed: Procedure(s) (LRB): CRANIOTOMY TUMOR EXCISION (Left) APPLICATION OF CRANIAL NAVIGATION (N/A)  Patient location during evaluation: PACU Anesthesia Type: General Level of consciousness: awake and alert Pain management: pain level controlled Vital Signs Assessment: post-procedure vital signs reviewed and stable Respiratory status: spontaneous breathing, nonlabored ventilation, respiratory function stable and patient connected to nasal cannula oxygen Cardiovascular status: blood pressure returned to baseline and stable Postop Assessment: no signs of nausea or vomiting Anesthetic complications: no       Last Vitals:  Vitals:   10/27/16 1200 10/27/16 1230  BP: 130/80 128/84  Pulse: 75 65  Resp: 13 16  Temp:      Last Pain:  Vitals:   10/27/16 1200  TempSrc:   PainSc: 0-No pain                 Ashyla Luth DAVID

## 2016-10-28 ENCOUNTER — Encounter (HOSPITAL_COMMUNITY): Payer: Self-pay | Admitting: Neurosurgery

## 2016-10-28 DIAGNOSIS — Z6827 Body mass index (BMI) 27.0-27.9, adult: Secondary | ICD-10-CM

## 2016-10-28 DIAGNOSIS — B954 Other streptococcus as the cause of diseases classified elsewhere: Secondary | ICD-10-CM

## 2016-10-28 DIAGNOSIS — D496 Neoplasm of unspecified behavior of brain: Secondary | ICD-10-CM

## 2016-10-28 DIAGNOSIS — Z95828 Presence of other vascular implants and grafts: Secondary | ICD-10-CM

## 2016-10-28 LAB — BASIC METABOLIC PANEL
Anion gap: 11 (ref 5–15)
BUN: 14 mg/dL (ref 6–20)
CHLORIDE: 105 mmol/L (ref 101–111)
CO2: 21 mmol/L — AB (ref 22–32)
Calcium: 8.3 mg/dL — ABNORMAL LOW (ref 8.9–10.3)
Creatinine, Ser: 0.72 mg/dL (ref 0.44–1.00)
GFR calc Af Amer: >60 mL/min (ref >60)
GFR calc non Af Amer: >60 mL/min (ref >60)
Glucose, Bld: 139 mg/dL — ABNORMAL HIGH (ref 65–99)
POTASSIUM: 3.6 mmol/L (ref 3.5–5.1)
Sodium: 137 mmol/L (ref 135–145)

## 2016-10-28 LAB — CBC
HEMATOCRIT: 38.9 % (ref 36.0–46.0)
HEMOGLOBIN: 12.8 g/dL (ref 12.0–15.0)
MCH: 29 pg (ref 26.0–34.0)
MCHC: 32.9 g/dL (ref 30.0–36.0)
MCV: 88 fL (ref 78.0–100.0)
Platelets: 188 10*3/uL (ref 150–400)
RBC: 4.42 MIL/uL (ref 3.87–5.11)
RDW: 12.6 % (ref 11.5–15.5)
WBC: 9.2 10*3/uL (ref 4.0–10.5)

## 2016-10-28 LAB — CULTURE, BLOOD (ROUTINE X 2)
Culture: NO GROWTH
Culture: NO GROWTH

## 2016-10-28 LAB — GLUCOSE, CAPILLARY
GLUCOSE-CAPILLARY: 140 mg/dL — AB (ref 65–99)
GLUCOSE-CAPILLARY: 166 mg/dL — AB (ref 65–99)
GLUCOSE-CAPILLARY: 126 mg/dL — AB (ref 65–99)
Glucose-Capillary: 147 mg/dL — ABNORMAL HIGH (ref 65–99)
Glucose-Capillary: 146 mg/dL — ABNORMAL HIGH (ref 65–99)

## 2016-10-28 MED ORDER — DEXAMETHASONE 4 MG PO TABS
4.0000 mg | ORAL_TABLET | Freq: Three times a day (TID) | ORAL | Status: DC
  Administered 2016-10-28 – 2016-10-30 (×7): 4 mg via ORAL
  Filled 2016-10-28 (×7): qty 1

## 2016-10-28 MED ORDER — PANTOPRAZOLE SODIUM 40 MG PO TBEC
40.0000 mg | DELAYED_RELEASE_TABLET | Freq: Every day | ORAL | Status: DC
  Administered 2016-10-28 – 2016-10-30 (×3): 40 mg via ORAL
  Filled 2016-10-28 (×3): qty 1

## 2016-10-28 MED ORDER — SODIUM CHLORIDE 0.9% FLUSH
10.0000 mL | INTRAVENOUS | Status: DC | PRN
Start: 2016-10-28 — End: 2016-10-30
  Administered 2016-10-29: 10 mL
  Filled 2016-10-28: qty 40

## 2016-10-28 NOTE — Evaluation (Signed)
Speech Language Pathology Evaluation Patient Details Name: Alexandria [name] MRN: [id number] DOB: 11/19/1945 Today's Date: 10/28/2016 Time: 7588-3254 SLP Time Calculation (min) (ACUTE ONLY): 20 min  Problem List:  Patient Active Problem List   Diagnosis Date Noted  . Brain tumor ([place])   . Hyperglycemia 10/27/2016  . Acute encephalopathy 10/23/2016  . Hx of breast cancer 10/23/2016  . Essential hypertension 10/23/2016  . Hypokalemia 10/23/2016  . 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 Cancer [facility]) 1996   breast cx  . Hypertension    Past Surgical History:  Past Surgical History:  Procedure Laterality Date  . ABDOMINAL HYSTERECTOMY    . MASTECTOMY     right breast   HPI:  Pt is a 71 y.o. female presented to ED with AMS and difficulty talking. MRI revealed L frontal mass, favored to be a brain abscess, now s/p craniotomy with excision3/26/18. PMH includes CA 1996, HTN. Past prodecures include R mastectomy and hysterectomy.   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 She continues to have word-finding errors with good awareness, which she can sometimes self-correct with additional time. Perseverative-like errors were observed during divergent naming task. She has difficulty with two-step commands, likely due in part to reduced working memory. Min cues were provided for selective attention and recall of new information. Pt would benefit from initial 24/7 supervision upon d/c home with OP SLP f/u to maximize functional communication and independence.    SLP Assessment  SLP Recommendation/Assessment: Patient needs continued Speech Lanaguage Pathology Services SLP Visit Diagnosis: Aphasia (R47.01)    Follow Up Recommendations  Outpatient SLP;24 hour supervision/assistance    Frequency and Duration min  2x/week  2 weeks      SLP Evaluation Cognition  Overall Cognitive Status: Impaired/Different from baseline Arousal/Alertness: Awake/alert Orientation Level: Oriented X4 Attention: Selective Selective Attention: Impaired Selective Attention Impairment: Verbal basic Memory: Impaired Memory Impairment: Retrieval deficit;Decreased recall of new information Awareness: Appears intact       Comprehension  Auditory Comprehension Overall Auditory Comprehension: Impaired Commands: Impaired One Step Basic Commands: 75-100% accurate Two Step Basic Commands: 50-74% accurate Conversation: Simple Interfering Components: Processing speed;Working Marine scientist    Expression Expression Primary Mode of Expression: Verbal Verbal Expression Overall Verbal Expression: Impaired Initiation: No impairment Level of Generative/Spontaneous Verbalization: Sentence Repetition: No impairment Naming: Impairment Confrontation: Within functional limits Divergent: 50-74% accurate Verbal Errors: Perseveration;Aware of errors Non-Verbal Means of Communication: Not applicable   Oral / Motor  Motor Speech Overall Motor Speech: Appears within functional limits for tasks assessed   GO                    Alexandria [name] 10/28/2016, 9:59 AM  Alexandria [name], M.A. CCC-SLP [phone]

## 2016-10-28 NOTE — Progress Notes (Signed)
PROGRESS NOTE   Alexandria [name]  KGU:[id number]    DOB: Aug 24, 1945    DOA: 10/22/2016  PCP: Osborne Casco, MD   I have briefly reviewed patients previous medical records in [facility].  Brief Narrative:  71 year old female, lives alone and independent of activities of daily living, PMH of remote history of breast cancer, HTN, presented to ED with altered mental status, difficulty communicating, confusion, left frontal temporal headache ongoing for few days prior to admission. In the ED CT head demonstrated masslike lesion in the left frontal lobe with surrounding vasogenic edema with localized mass effect. Oncology was concerned about malignancy and suggested MRI brain, steroids and neurosurgery consultation. MRI brain concerning for abscess versus malignancy. S/P Craniotomy and brain abscess evacuation 10/27/16.  Assessment & Plan:   Principal Problem:   Brain abscess Active Problems:   Acute encephalopathy   Hx of breast cancer   Essential hypertension   Hypokalemia   Hyperglycemia   Brain tumor ([place])   Left frontal lobe ring-enhancing mass, brain abscess status post craniotomy 3/26 - CT head without contrast 10/22/16: Left frontal lobe mass lesion to by 7 x 2.5 cm with surrounding vasogenic edema, highly suspicious for neoplastic mass. Local mass effect within the left frontal lobe with associated mild rightward midline shift. No intracranial hemorrhage. - MRI brain 3/21: Irregular peripherally enhancing mass lesion in the anterior left frontal lobe measuring 2.6 x 3 x 2 cm with associated enhancement of the adjacent dura and central restricted diffusion most consistent with an abscess. Neoplasm considered less likely. -Surveillance CT chest abdomen and pelvis without acute lesions or lesions suspicious for malignancy. - Brain lab MRI 10/22/16: Results reviewed. - Neurosurgery consultation and follow-up appreciated. She has improved on IV Decadron. Although radiologist  interpreted her brain MRI is most consistent with abscess, given lack of leukocytosis and fever, surgeons are concerned about a primary brain tumor. Keppra initiated.  - S/P Craniotomy and brain abscess evacuation 10/27/16. Gram stain shows gram-positive cocci in pairs & chains. ID consulted, suspect that her solitary left frontal brain abscess was probably due to an episode of bacteremia  , He initiated IV ceftriaxone and metronidazole for polymicrobial brain abscess. She will need at least 6 weeks of antibiotic therapy.Follow final culture and pathology results. Blood cultures 2: Negative to date.    Acute encephalopathy, improving - Secondary to brain lesion as above. On steroids per neurosurgery. Will clarify how long she needs to be on IV steroids. As per family, mental status improved but not at baseline and speech has clearly improved post surgery.  Hypokalemia Replaced. Magnesium normal.  Essential hypertension Mildly uncontrolled. Continue Toprol-XL &. Benazepril-HCTZ. When necessary IV hydralazine.  Mild hyperglycemia Probably related to steroids. Monitor CBGs and consider SSI. A1c 5.8.  Sinus tachycardia May have been related to brain lesion. Resolved. TSH normal. DC telemetry. Continue Toprol.    DVT prophylaxis: SCDs  Code Status: Full  Family Communication: Discussed in detail with patient's daughter at bedside. Disposition: PT OT eval   Consultants:  Neurosurgery EDP discussed with oncology  Infectious Disease  Procedures:  S/P Craniotomy and brain abscess evacuation 10/27/16  Antimicrobials:  IV Ceftriaxone IV Metronidazole   Subjective:  Patient still has some expressive aphasia, word finding difficulty  Objective:  Vitals:   10/28/16 0400 10/28/16 0500 10/28/16 0600 10/28/16 0700  BP: (!) 145/73 (!) 165/88 (!) 149/82 (!) 163/91  Pulse: 64 65 73 63  Resp: 15 14 17 15   Temp: 98 F (36.7 C)  TempSrc: Oral     SpO2: 94% 93% 93% 93%  Weight:        Height:        Examination:  General exam: Pleasant elderly female lying comfortably supine in bed. Respiratory system: Clear to auscultation. Respiratory effort normal. Cardiovascular system: S1 & S2 heard, RRR. No JVD, murmurs, rubs, gallops or clicks. No pedal edema.Telemetry: Sinus rhythm.  Gastrointestinal system: Abdomen is nondistended, soft and nontender. No organomegaly or masses felt. Normal bowel sounds heard. Central nervous system: Alert and oriented. Dysphasia +. Speech more fluent. Extremities: Symmetric 5 x 5 power. Skin: No rashes, lesions or ulcers Psychiatry: Judgement and insight appear normal. Mood & affect appropriate.     Data Reviewed: I have personally reviewed following labs and imaging studies  CBC:  Recent Labs Lab 10/22/16 1835 10/22/16 2111 10/23/16 0248 10/24/16 0435 10/28/16 0554  WBC  --  6.8 6.6 11.3* 9.2  NEUTROABS  --  4.7  --   --   --   HGB 13.3 12.0 13.2 12.4 12.8  HCT 39.0 35.4* 39.9 37.5 38.9  MCV  --  85.7 88.9 88.4 88.0  PLT  --  149* 154 169 001   Basic Metabolic Panel:  Recent Labs Lab 10/22/16 1835 10/22/16 2111 10/23/16 0248 10/23/16 1003 10/24/16 0435 10/28/16 0554  NA 141 140 140  --  139 137  K 3.4* 2.6* 3.0*  --  4.2 3.6  CL 107 109 105  --  104 105  CO2  --  24 21*  --  25 21*  GLUCOSE 99 118* 156*  --  166* 139*  BUN 11 9 6   --  12 14  CREATININE 0.70 0.64 0.71  --  0.74 0.72  CALCIUM  --  8.8* 8.7*  --  9.3 8.3*  MG  --   --   --  1.8  --   --    Liver Function Tests:  Recent Labs Lab 10/22/16 2111 10/24/16 0435  AST 16 20  ALT 12* 13*  ALKPHOS 49 49  BILITOT 1.0 0.7  PROT 6.6 6.3*  ALBUMIN 3.8 3.1*   Coagulation Profile:  Recent Labs Lab 10/23/16 0248  INR 1.08   Cardiac Enzymes: No results for input(s): CKTOTAL, CKMB, CKMBINDEX, TROPONINI in the last 168 hours. HbA1C: No results for input(s): HGBA1C in the last 72 hours. CBG:  Recent Labs Lab 10/27/16 1135 10/27/16 1534  10/27/16 1943 10/27/16 2319 10/28/16 0324  GLUCAP 180* 148* 202* 188* 140*    Recent Results (from the past 240 hour(s))  Culture, blood (x 2)     Status: None (Preliminary result)   Collection Time: 10/23/16  2:55 AM  Result Value Ref Range Status   Specimen Description BLOOD LEFT ANTECUBITAL  Final   Special Requests BOTTLES DRAWN AEROBIC AND ANAEROBIC 10CC EACH  Final   Culture NO GROWTH 4 DAYS  Final   Report Status PENDING  Incomplete  Culture, blood (x 2)     Status: None (Preliminary result)   Collection Time: 10/23/16  3:04 AM  Result Value Ref Range Status   Specimen Description BLOOD LEFT HAND  Final   Special Requests BOTTLES DRAWN AEROBIC AND ANAEROBIC 10CC EACH  Final   Culture NO GROWTH 4 DAYS  Final   Report Status PENDING  Incomplete  Culture, Urine     Status: Abnormal   Collection Time: 10/24/16  8:25 AM  Result Value Ref Range Status   Specimen Description URINE, RANDOM  Final  Special Requests NONE  Final   Culture MULTIPLE SPECIES PRESENT, SUGGEST RECOLLECTION (A)  Final   Report Status 10/25/2016 FINAL  Final  MRSA PCR Screening     Status: None   Collection Time: 10/25/16  3:29 PM  Result Value Ref Range Status   MRSA by PCR NEGATIVE NEGATIVE Final    Comment:        The GeneXpert MRSA Assay (FDA approved for NASAL specimens only), is one component of a comprehensive MRSA colonization surveillance program. It is not intended to diagnose MRSA infection nor to guide or monitor treatment for MRSA infections.   Aerobic/Anaerobic Culture (surgical/deep wound)     Status: None (Preliminary result)   Collection Time: 10/27/16  8:48 AM  Result Value Ref Range Status   Specimen Description ABSCESS  Final   Special Requests LEFT FRONTAL LOBE BRAIN MASS  Final   Gram Stain   Final    ABUNDANT WBC PRESENT, PREDOMINANTLY PMN ABUNDANT GRAM POSITIVE COCCI IN PAIRS IN CHAINS CRITICAL RESULT CALLED TO, READ BACK BY AND VERIFIED WITH: Valarie Merino AT 0045 10/27/16  BY L BENFIELD    Culture PENDING  Incomplete   Report Status PENDING  Incomplete  Aerobic/Anaerobic Culture (surgical/deep wound)     Status: None (Preliminary result)   Collection Time: 10/27/16  9:14 AM  Result Value Ref Range Status   Specimen Description TISSUE  Final   Special Requests LEFT FRONTAL LOBE BRAIN MASS  Final   Gram Stain   Final    MODERATE WBC PRESENT,BOTH PMN AND MONONUCLEAR RARE GRAM POSITIVE COCCI IN PAIRS CRITICAL RESULT CALLED TO, READ BACK BY AND VERIFIED WITH: P LOPEZ,RN AT 1013 10/27/16 BY L BENFIELD    Culture PENDING  Incomplete   Report Status PENDING  Incomplete         Radiology Studies: No results found.      Scheduled Meds: . benazepril  20 mg Oral Daily   And  . hydrochlorothiazide  25 mg Oral Daily  . cefTRIAXone (ROCEPHIN)  IV  2 g Intravenous Q12H  . dexamethasone  4 mg Intravenous Q6H  . docusate sodium  100 mg Oral BID  . insulin aspart  0-9 Units Subcutaneous TID WC  . levETIRAcetam  500 mg Intravenous Q12H  . metoprolol succinate  100 mg Oral QPM  . metronidazole  500 mg Intravenous Q8H  . pantoprazole (PROTONIX) IV  40 mg Intravenous QHS   Continuous Infusions: . 0.9 % NaCl with KCl 20 mEq / L 75 mL/hr at 10/28/16 0521     LOS: 6 days      Reyne Dumas, MD,   [facility] Pager [phone]  If 7PM-7AM, please contact night-coverage [email or web address] Password [facility] 10/28/2016, 7:59 AM

## 2016-10-28 NOTE — Progress Notes (Signed)
[facility]  [facility] will provide Home Infusion Pharmacy services for Ms. Alexandria [name] upon DC home. Met with pt, son and daughter to advised of POC for home IV ABX with [facility].  [facility] will work with patient's [facility] agency of choice- advised pt and family of same.  Daughter, Alexandria [name], will be primary caregiver for IV ABX and pt desires to also learn. Initial teaching arranged with pt and daughter for IV ABX at 8:30 am on 10-29-16.     If patient discharges after hours, please call [phone].   Larry Sierras 10/28/2016, 5:09 PM

## 2016-10-28 NOTE — Progress Notes (Addendum)
Subjective: Patient reports "I feel ok" "I do think I'm speaking better"  Objective: Vital signs in last 24 hours: Temp:  [97.2 F (36.2 C)-98.1 F (36.7 C)] 98 F (36.7 C) (03/27 0400) Pulse Rate:  [59-83] 63 (03/27 0700) Resp:  [10-25] 15 (03/27 0700) BP: (116-165)/(67-115) 163/91 (03/27 0700) SpO2:  [91 %-98 %] 93 % (03/27 0700) Arterial Line BP: (82-178)/(71-92) 82/76 (03/27 0300)  Intake/Output from previous day: 03/26 0701 - 03/27 0700 In: 2253.8 [I.V.:1898.8; IV Piggyback:355] Out: 1050 [Urine:1000; Blood:50] Intake/Output this shift: No intake/output data recorded.  Alert, now conversant. Smiling. PEARL. MAEW. No drift. Incision with Telfa, some dried blood on drsg. Aware of plan for IVAB.   Lab Results:  Recent Labs  10/28/16 0554  WBC 9.2  HGB 12.8  HCT 38.9  PLT 188   BMET  Recent Labs  10/28/16 0554  NA 137  K 3.6  CL 105  CO2 21*  GLUCOSE 139*  BUN 14  CREATININE 0.72  CALCIUM 8.3*    Studies/Results: No results found.  Assessment/Plan: Improving  LOS: 6 days  Mobilize as tolerated. Planning for IVAB via PICC per ID.     Verdis Prime 10/28/2016, 7:29 AM   Patient progressing nicely.  Mobilize.  Taper decadron to 4 mg po TID starting today.  OK to transfer from ICU to medical service if stable from their standpoint.

## 2016-10-28 NOTE — Progress Notes (Signed)
PT Cancellation Note  Patient Details Name: Alexandria [name] MRN: [id number] DOB: May 07, 1946   Cancelled Treatment:    Reason Eval/Treat Not Completed: Patient at procedure or test/unavailable Pt in sterile procedure at this time. Will reattempt as schedule allows.   [name], PT, DPT  Acute Rehabilitation Services  Pager: [phone]   [name] 10/28/2016, 11:06 AM

## 2016-10-28 NOTE — Progress Notes (Signed)
Patient is transferred from unit 3C to room 5C04 at this time. Alert and in stable condition.

## 2016-10-28 NOTE — Progress Notes (Signed)
Patient ID: Alexandria [name], female   DOB: [birth year], 71 y.o.   MRN: [id number]          [facility] for Infectious Disease  Date of Admission:  10/22/2016           Day 2 ceftriaxone        Day 2 metronidazole  Principal Problem:   Brain abscess Active Problems:   Acute encephalopathy   Hx of breast cancer   Essential hypertension   Hypokalemia   Hyperglycemia   Brain tumor ([place])   . benazepril  20 mg Oral Daily   And  . hydrochlorothiazide  25 mg Oral Daily  . cefTRIAXone (ROCEPHIN)  IV  2 g Intravenous Q12H  . dexamethasone  4 mg Oral Q8H  . docusate sodium  100 mg Oral BID  . insulin aspart  0-9 Units Subcutaneous TID WC  . levETIRAcetam  500 mg Intravenous Q12H  . metoprolol succinate  100 mg Oral QPM  . metronidazole  500 mg Intravenous Q8H  . pantoprazole  40 mg Oral Daily    SUBJECTIVE: She is feeling better today. She denies headache.  Review of Systems: Review of Systems  Constitutional: Negative for chills, diaphoresis and fever.  Gastrointestinal: Negative for abdominal pain, diarrhea, nausea and vomiting.  Neurological: Negative for headaches.    Past Medical History:  Diagnosis Date  . Cancer [facility]) 1996   breast cx  . Hypertension     Social History  Substance Use Topics  . Smoking status: Never Smoker  . Smokeless tobacco: Never Used  . Alcohol use Yes     Comment: occasional    Family History  Problem Relation Age of Onset  . Breast cancer Mother   . Colon cancer Father    Allergies  Allergen Reactions  . Sulfa Antibiotics     OBJECTIVE: Vitals:   10/28/16 0900 10/28/16 1000 10/28/16 1031 10/28/16 1315  BP: (!) 169/85 (!) 162/84 (!) 151/81 (!) 156/83  Pulse: 74 80 77 68  Resp: 15 14 20 18   Temp:   97.9 F (36.6 C) 97.9 F (36.6 C)  TempSrc:   Oral Oral  SpO2: 93% 96% 96% 93%  Weight:      Height:       Body mass index is 27.48 kg/m.  Physical Exam  Constitutional:  She is smiling and sitting up in bed.  She is visiting with her son, Iona Beard.  Cardiovascular: Normal rate and regular rhythm.   No murmur heard. Pulmonary/Chest: Effort normal and breath sounds normal.  Abdominal: Soft. There is no tenderness.  Neurological: She is alert.  Her speech is much more fluent today.  Skin: No rash noted.   Left arm PICC.  Psychiatric: Mood and affect normal.    Lab Results Lab Results  Component Value Date   WBC 9.2 10/28/2016   HGB 12.8 10/28/2016   HCT 38.9 10/28/2016   MCV 88.0 10/28/2016   PLT 188 10/28/2016    Lab Results  Component Value Date   CREATININE 0.72 10/28/2016   BUN 14 10/28/2016   NA 137 10/28/2016   K 3.6 10/28/2016   CL 105 10/28/2016   CO2 21 (L) 10/28/2016    Lab Results  Component Value Date   ALT 13 (L) 10/24/2016   AST 20 10/24/2016   ALKPHOS 49 10/24/2016   BILITOT 0.7 10/24/2016     Microbiology: Recent Results (from the past 240 hour(s))  Culture, blood (x 2)     Status:  None   Collection Time: 10/23/16  2:55 AM  Result Value Ref Range Status   Specimen Description BLOOD LEFT ANTECUBITAL  Final   Special Requests BOTTLES DRAWN AEROBIC AND ANAEROBIC 10CC EACH  Final   Culture NO GROWTH 5 DAYS  Final   Report Status 10/28/2016 FINAL  Final  Culture, blood (x 2)     Status: None   Collection Time: 10/23/16  3:04 AM  Result Value Ref Range Status   Specimen Description BLOOD LEFT HAND  Final   Special Requests BOTTLES DRAWN AEROBIC AND ANAEROBIC 10CC EACH  Final   Culture NO GROWTH 5 DAYS  Final   Report Status 10/28/2016 FINAL  Final  Culture, Urine     Status: Abnormal   Collection Time: 10/24/16  8:25 AM  Result Value Ref Range Status   Specimen Description URINE, RANDOM  Final   Special Requests NONE  Final   Culture MULTIPLE SPECIES PRESENT, SUGGEST RECOLLECTION (A)  Final   Report Status 10/25/2016 FINAL  Final  MRSA PCR Screening     Status: None   Collection Time: 10/25/16  3:29 PM  Result Value Ref Range Status   MRSA by PCR  NEGATIVE NEGATIVE Final    Comment:        The GeneXpert MRSA Assay (FDA approved for NASAL specimens only), is one component of a comprehensive MRSA colonization surveillance program. It is not intended to diagnose MRSA infection nor to guide or monitor treatment for MRSA infections.   Aerobic/Anaerobic Culture (surgical/deep wound)     Status: None (Preliminary result)   Collection Time: 10/27/16  8:48 AM  Result Value Ref Range Status   Specimen Description ABSCESS  Final   Special Requests LEFT FRONTAL LOBE BRAIN MASS  Final   Gram Stain   Final    ABUNDANT WBC PRESENT, PREDOMINANTLY PMN ABUNDANT GRAM POSITIVE COCCI IN PAIRS IN CHAINS CRITICAL RESULT CALLED TO, READ BACK BY AND VERIFIED WITH: Valarie Merino AT 6811 10/27/16 BY L BENFIELD    Culture   Final    ABUNDANT STREPTOCOCCUS INTERMEDIUS SUSCEPTIBILITIES TO FOLLOW    Report Status PENDING  Incomplete  Aerobic/Anaerobic Culture (surgical/deep wound)     Status: None (Preliminary result)   Collection Time: 10/27/16  9:14 AM  Result Value Ref Range Status   Specimen Description TISSUE  Final   Special Requests LEFT FRONTAL LOBE BRAIN MASS  Final   Gram Stain   Final    MODERATE WBC PRESENT,BOTH PMN AND MONONUCLEAR RARE GRAM POSITIVE COCCI IN PAIRS CRITICAL RESULT CALLED TO, READ BACK BY AND VERIFIED WITH: P LOPEZ,RN AT 1013 10/27/16 BY L BENFIELD    Culture FEW STREPTOCOCCUS INTERMEDIUS  Final   Report Status PENDING  Incomplete     ASSESSMENT: She has brain abscess. Cultures have grown Streptococcus intermedius. This is often part of a polymicrobial infection. I plan on continuing ceftriaxone and metronidazole for a minimum of 6 weeks.  PLAN: 1. Continue current antibiotics  Michel Bickers, MD [facility] for Infectious [place] [phone] pager   [phone] cell 10/28/2016, 3:50 PM

## 2016-10-28 NOTE — Evaluation (Signed)
Physical Therapy Evaluation Patient Details Name: Alexandria [name] MRN: [id number] DOB: November 15, 1945 Today's Date: 10/28/2016   History of Present Illness  Pt is a 71 y.o. female presented to ED with AMS and acute encephalopathy.  MRI revealed L frontal mass.  S/p Craniotomy with mass excision 10/27/16. PMH includes CA 1996, HTN.  Past prodecures include R mastectomy and hysterectomy.  Clinical Impression  Pt s/p surgery above with deficits below. PTA, pt was independent with all functional mobility. Upon evaluation, pt with expressive difficulties and responding with yes or no answers for majority of session. Pt with increased impulsivity throughout session requiring multiple cues for safety. Unsure of assist level available at d/c from children. Recommending CIR at d/c to improve safety and independence with functional mobility. Will continue to follow to maximize functional mobility independence and safety.    Follow Up Recommendations CIR;Supervision/Assistance - 24 hour    Equipment Recommendations  3in1 (PT)    Recommendations for Other Services Rehab consult     Precautions / Restrictions Precautions Precautions: Fall Restrictions Weight Bearing Restrictions: No      Mobility  Bed Mobility               General bed mobility comments: In chair upon entry   Transfers Overall transfer level: Needs assistance Equipment used: None Transfers: Sit to/from Stand Sit to Stand: Min guard         General transfer comment: Min guard for safety. Pt demonstrating impulsivity with transfers and required multiple safety cues from PT to wait for PT to perform transfers. When transferring to toilet, pt pulling off gait belt stating "I don't need this!" PT educated pt about purpose of gait belt, and allowed PT to place back on.   Ambulation/Gait Ambulation/Gait assistance: Min guard Ambulation Distance (Feet): 100 Feet Assistive device: None Gait Pattern/deviations:  Step-through pattern;Decreased stride length;Drifts right/left   Gait velocity interpretation: >2.62 ft/sec, indicative of independent community ambulator General Gait Details: Min guard for safety. Required multiple cues for safe gait speed. Pt requiring safety cues throughout for safety with mobility.   Stairs            Wheelchair Mobility    Modified Rankin (Stroke Patients Only)       Balance Overall balance assessment: Needs assistance Sitting-balance support: No upper extremity supported;Feet supported Sitting balance-Leahy Scale: Good     Standing balance support: No upper extremity supported;During functional activity Standing balance-Leahy Scale: Fair                               Pertinent Vitals/Pain Pain Assessment: No/denies pain    Home Living Family/patient expects to be discharged to:: Private residence Living Arrangements: Alone Available Help at Discharge: Family;Friend(s);Available 24 hours/day Type of Home: House Home Access: Other (comment) (threshold to get into the house ) Entrance Stairs-Rails: None Entrance Stairs-Number of Steps: 1 Home Layout: Two level;Able to live on main level with bedroom/bathroom Home Equipment: Shower seat - built in Additional Comments: Pt sister reporting pt's children should be able to stay with pt 24/7.     Prior Function Level of Independence: Independent               Hand Dominance   Dominant Hand: Right    Extremity/Trunk Assessment   Upper Extremity Assessment Upper Extremity Assessment: Defer to OT evaluation    Lower Extremity Assessment Lower Extremity Assessment: Difficult to assess due to impaired cognition;Generalized weakness (  at least 3+/5; unable to formally assess)       Communication   Communication: Expressive difficulties  Cognition Arousal/Alertness: Awake/alert Behavior During Therapy: Impulsive Overall Cognitive Status: Impaired/Different from baseline Area  of Impairment: Safety/judgement;Following commands;Attention;Problem solving;Memory                   Current Attention Level: Sustained Memory: Decreased short-term memory Following Commands: Follows one step commands inconsistently;Follows multi-step commands inconsistently;Follows multi-step commands with increased time Safety/Judgement: Decreased awareness of safety;Decreased awareness of deficits   Problem Solving: Slow processing;Difficulty sequencing;Requires verbal cues;Requires tactile cues General Comments: Pt very impulsive and required multiple safety cues to wait for PT to perform mobility tasks. Pt sister unaware of deficits and when attempting to educate, pt sister slightly defensive and resistive to safety awareness education stating "she will get better as time goes on".       General Comments General comments (skin integrity, edema, etc.): Pt with impulsivity during entire therapy session and difficulty problem solving. When coming out of bathroom, had to be reminded to wash hands. When standing at the sink, pt required verbal cues to apply soap. After getting soap, pt turned off water before rinsing. Pt's sister unaware of current deficits and slightly resistive to education about deficits and stating "pt will get better." When educated about rehab, pt's sister stating pt will improve and be able to go home. Educated about HHPT, and pt's sister asking if we can get rid of recommendations once pt improved.     Exercises     Assessment/Plan    PT Assessment Patient needs continued PT services  PT Problem List Decreased strength;Decreased balance;Decreased mobility;Decreased coordination;Decreased cognition;Decreased knowledge of use of DME;Decreased safety awareness       PT Treatment Interventions DME instruction;Gait training;Stair training;Functional mobility training;Therapeutic activities;Balance training;Therapeutic exercise;Neuromuscular re-education;Cognitive  remediation;Patient/family education    PT Goals (Current goals can be found in the Care Plan section)  Acute Rehab PT Goals Patient Stated Goal: Pt did not state goals during session PT Goal Formulation: With patient/family Time For Goal Achievement: 11/11/16 Potential to Achieve Goals: Good    Frequency Min 3X/week   Barriers to discharge Other (comment) (unsure of caregiver support at home )      Co-evaluation               End of Session Equipment Utilized During Treatment: Gait belt Activity Tolerance: Patient tolerated treatment well Patient left: in chair;with call bell/phone within reach;with chair alarm set;with family/visitor present Nurse Communication: Mobility status PT Visit Diagnosis: Other abnormalities of gait and mobility (R26.89);Other symptoms and signs involving the nervous system (R29.898)    Time: 1343-1401 PT Time Calculation (min) (ACUTE ONLY): 18 min   Charges:   PT Evaluation $PT Eval Moderate Complexity: 1 Procedure     PT G Codes:        [name], PT, DPT  Acute Rehabilitation Services  Pager: [phone]   [name] 10/28/2016, 4:58 PM

## 2016-10-28 NOTE — Progress Notes (Signed)
Peripherally Inserted Central Catheter/Midline Placement  The IV Nurse has discussed with the patient and/or persons authorized to consent for the patient, the purpose of this procedure and the potential benefits and risks involved with this procedure.  The benefits include less needle sticks, lab draws from the catheter, and the patient may be discharged home with the catheter. Risks include, but not limited to, infection, bleeding, blood clot (thrombus formation), and puncture of an artery; nerve damage and irregular heartbeat and possibility to perform a PICC exchange if needed/ordered by physician.  Alternatives to this procedure were also discussed.  Bard Power PICC patient education guide, fact sheet on infection prevention and patient information card has been provided to patient /or left at bedside.    PICC/Midline Placement Documentation  PICC Single Lumen 97/98/92 PICC Left Basilic 39 cm 1 cm (Active)  Indication for Insertion or Continuance of Line Home intravenous therapies (PICC only) 10/28/2016 11:19 AM  Exposed Catheter (cm) 1 cm 10/28/2016 11:19 AM  Site Assessment Clean;Dry;Intact 10/28/2016 11:19 AM  Line Status Flushed;Saline locked;Blood return noted 10/28/2016 11:19 AM  Dressing Type Transparent;Securing device 10/28/2016 11:19 AM  Dressing Status Clean;Dry;Intact;Antimicrobial disc in place 10/28/2016 11:19 AM  Dressing Change Due 11/04/16 10/28/2016 11:19 AM       Alexandria [name] 10/28/2016, 11:22 AM

## 2016-10-28 NOTE — Care Management Note (Signed)
Case Management Note  Patient Details  Name: Alexandria [name] MRN: [id number] Date of Birth: [birth year]  Subjective/Objective:  Pt admitted on 10/22/16 with acute encephalopathy; found to have brain tumor with abscess requiring craniotomy with tumor excision on 10/27/16.  PTA, pt independent, lives alone; has supportive daughter.                  Action/Plan: PT/OT evaluations pending.  Pt will need IV antibiotics for total of 6 weeks; PICC to be placed today.  Will follow to determine LOC needed at dc.    Expected Discharge Date:                  Expected Discharge Plan:     In-House Referral:     Discharge planning Services  CM Consult  Post Acute Care Choice:    Choice offered to:     DME Arranged:    DME Agency:     HH Arranged:    HH Agency:     Status of Service:  In process, will continue to follow  If discussed at Long Length of Stay Meetings, dates discussed:    Additional Comments:  Ella Bodo, RN 10/28/2016, 3:18 PM

## 2016-10-29 DIAGNOSIS — R269 Unspecified abnormalities of gait and mobility: Secondary | ICD-10-CM

## 2016-10-29 LAB — GLUCOSE, CAPILLARY
Glucose-Capillary: 134 mg/dL — ABNORMAL HIGH (ref 65–99)
Glucose-Capillary: 128 mg/dL — ABNORMAL HIGH (ref 65–99)
Glucose-Capillary: 123 mg/dL — ABNORMAL HIGH (ref 65–99)

## 2016-10-29 LAB — COMPREHENSIVE METABOLIC PANEL
ALT: 31 U/L (ref 14–54)
AST: 20 U/L (ref 15–41)
Albumin: 2.5 g/dL — ABNORMAL LOW (ref 3.5–5.0)
Alkaline Phosphatase: 50 U/L (ref 38–126)
Anion gap: 8 (ref 5–15)
BUN: 16 mg/dL (ref 6–20)
CO2: 25 mmol/L (ref 22–32)
Calcium: 8.4 mg/dL — ABNORMAL LOW (ref 8.9–10.3)
Chloride: 106 mmol/L (ref 101–111)
Creatinine, Ser: 0.74 mg/dL (ref 0.44–1.00)
Glucose, Bld: 138 mg/dL — ABNORMAL HIGH (ref 65–99)
Potassium: 3.5 mmol/L (ref 3.5–5.1)
Sodium: 139 mmol/L (ref 135–145)
TOTAL PROTEIN: 5 g/dL — AB (ref 6.5–8.1)
Total Bilirubin: 0.5 mg/dL (ref 0.3–1.2)

## 2016-10-29 MED ORDER — LEVETIRACETAM 500 MG PO TABS
500.0000 mg | ORAL_TABLET | Freq: Two times a day (BID) | ORAL | Status: DC
  Administered 2016-10-29 – 2016-10-30 (×2): 500 mg via ORAL
  Filled 2016-10-29 (×2): qty 1

## 2016-10-29 MED ORDER — METRONIDAZOLE 500 MG PO TABS
500.0000 mg | ORAL_TABLET | Freq: Three times a day (TID) | ORAL | Status: DC
  Administered 2016-10-29 – 2016-10-30 (×4): 500 mg via ORAL
  Filled 2016-10-29 (×4): qty 1

## 2016-10-29 MED ORDER — SACCHAROMYCES BOULARDII 250 MG PO CAPS
250.0000 mg | ORAL_CAPSULE | Freq: Two times a day (BID) | ORAL | Status: DC
  Administered 2016-10-29 – 2016-10-30 (×3): 250 mg via ORAL
  Filled 2016-10-29 (×3): qty 1

## 2016-10-29 NOTE — Consult Note (Signed)
Physical Medicine and Rehabilitation Consult Reason for Consult: Altered mental status with acute encephalopathy related to left frontal mass Referring Physician: [facility]   HPI: Alexandria [name] is a 71 y.o. right handed female with history of hypertension, remote history of breast cancer 1996. Per chart review patient lives alone and was independent prior to admission. 2 level home with bedroom on main level. Family and area question 24-hour assist. Presented 10/22/2016 with altered mental status that progressed over the past few days. CT imaging demonstrated a masslike lesion in the left frontal lobe with surrounding vasogenic edema and localized mass effect question malignancy versus abscess. CT of abdomen and pelvis as well as CT of chest negative for acute findings. Neurosurgery consulted and underwent left craniotomy tumor excision 10/27/2016 per Dr. Vertell Limber. Infectious disease consulted with blood cultures showing Streptococcus intermedius and presently on broad-spectrum antibiotics anticipate 6 weeks of antibiotic care.Decadron protocol as directed. Keppra added for seizure prophylaxis. Tolerating a regular diet. Physical therapy evaluation completed with recommendations of physical medicine rehabilitation consult.  Daughter at bedside. Reviewed therapy notes, patient impulsive, decreased safety awareness  Review of Systems  Constitutional: Positive for malaise/fatigue. Negative for chills and fever.  HENT: Negative for hearing loss.   Eyes: Negative for blurred vision and double vision.  Respiratory: Negative for cough and shortness of breath.   Cardiovascular: Negative for chest pain, palpitations and leg swelling.  Gastrointestinal: Positive for constipation. Negative for nausea and vomiting.  Genitourinary: Negative for dysuria, flank pain and hematuria.  Musculoskeletal: Positive for myalgias. Negative for falls.  Skin: Negative for rash.  Neurological: Positive for  dizziness and headaches.  Psychiatric/Behavioral: The patient has insomnia.   All other systems reviewed and are negative.  Past Medical History:  Diagnosis Date  . Cancer [facility]) 1996   breast cx  . Hypertension    Past Surgical History:  Procedure Laterality Date  . ABDOMINAL HYSTERECTOMY    . APPLICATION OF CRANIAL NAVIGATION N/A 10/27/2016   Procedure: APPLICATION OF CRANIAL NAVIGATION;  Surgeon: Erline Levine, MD;  Location: [place];  Service: Neurosurgery;  Laterality: N/A;  . CRANIOTOMY Left 10/27/2016   Procedure: CRANIOTOMY TUMOR EXCISION;  Surgeon: Erline Levine, MD;  Location: [place];  Service: Neurosurgery;  Laterality: Left;  Marland Kitchen MASTECTOMY     right breast   Family History  Problem Relation Age of Onset  . Breast cancer Mother   . Colon cancer Father    Social History:  reports that she has never smoked. She has never used smokeless tobacco. She reports that she drinks alcohol. She reports that she does not use drugs. Allergies:  Allergies  Allergen Reactions  . Sulfa Antibiotics    Medications Prior to Admission  Medication Sig Dispense Refill  . ALPRAZolam (XANAX) 0.25 MG tablet Take 0.125-0.25 mg by mouth at bedtime as needed for anxiety.     . benazepril-hydrochlorthiazide (LOTENSIN HCT) 20-25 MG tablet Take 1 tablet by mouth daily.     . metoprolol succinate (TOPROL-XL) 100 MG 24 hr tablet Take 100 mg by mouth every evening.       Home: Home Living Family/patient expects to be discharged to:: Private residence Living Arrangements: Alone Available Help at Discharge: Family, Friend(s), Available 24 hours/day Type of Home: House Home Access: Other (comment) (threshold to get into the house ) Entrance Stairs-Number of Steps: 1 Entrance Stairs-Rails: None Home Layout: Two level, Able to live on main level with bedroom/bathroom Bathroom Shower/Tub: Multimedia programmer: Standard Bathroom  Accessibility: Yes Home Equipment: Shower seat - built  in Additional Comments: Pt sister reporting pt's children should be able to stay with pt 24/7.   Lives With: Alone  Functional History: Prior Function Level of Independence: Independent Functional Status:  Mobility: Bed Mobility Overal bed mobility: Independent General bed mobility comments: In chair upon entry  Transfers Overall transfer level: Needs assistance Equipment used: None Transfers: Sit to/from Stand Sit to Stand: Min guard General transfer comment: Min guard for safety. Pt demonstrating impulsivity with transfers and required multiple safety cues from PT to wait for PT to perform transfers. When transferring to toilet, pt pulling off gait belt stating "I don't need this!" PT educated pt about purpose of gait belt, and allowed PT to place back on.  Ambulation/Gait Ambulation/Gait assistance: Min guard Ambulation Distance (Feet): 100 Feet Assistive device: None Gait Pattern/deviations: Step-through pattern, Decreased stride length, Drifts right/left General Gait Details: Min guard for safety. Required multiple cues for safe gait speed. Pt requiring safety cues throughout for safety with mobility.  Gait velocity interpretation: >2.62 ft/sec, indicative of independent community ambulator Stairs: Yes Stairs assistance: Min guard Stair Management: One rail Left, Alternating pattern Number of Stairs: 11 General stair comments: pt briskly ascended and descended stairs before any instruction given.    ADL:    Cognition: Cognition Overall Cognitive Status: Impaired/Different from baseline Arousal/Alertness: Awake/alert Orientation Level: Oriented X4 Attention: Selective Selective Attention: Impaired Selective Attention Impairment: Verbal basic Memory: Impaired Memory Impairment: Retrieval deficit, Decreased recall of new information Awareness: Appears intact Cognition Arousal/Alertness: Awake/alert Behavior During Therapy: Impulsive Overall Cognitive Status:  Impaired/Different from baseline Area of Impairment: Safety/judgement, Following commands, Attention, Problem solving, Memory Current Attention Level: Sustained Memory: Decreased short-term memory Following Commands: Follows one step commands inconsistently, Follows multi-step commands inconsistently, Follows multi-step commands with increased time Safety/Judgement: Decreased awareness of safety, Decreased awareness of deficits Problem Solving: Slow processing, Difficulty sequencing, Requires verbal cues, Requires tactile cues General Comments: Pt very impulsive and required multiple safety cues to wait for PT to perform mobility tasks. Pt sister unaware of deficits and when attempting to educate, pt sister slightly defensive and resistive to safety awareness education stating "she will get better as time goes on".   Blood pressure (!) 181/82, pulse 69, temperature 98.2 F (36.8 C), temperature source Oral, resp. rate 20, height 5\' 5"  (1.651 m), weight 74.9 kg (165 lb 2 oz), SpO2 95 %. Physical Exam  Vitals reviewed. HENT:  Craniotomy site clean and dry  Eyes: EOM are normal.  Neck: Normal range of motion. Neck supple. No thyromegaly present.  Cardiovascular: Normal rate and regular rhythm.   Respiratory: Effort normal and breath sounds normal. No respiratory distress.  GI: Soft. Bowel sounds are normal. She exhibits no distension.  Neurological: She is alert.  Makes good eye contact with examiner. Follows simple commands. She was able to provide her age but needed multiple cues for date of birth. She does exhibit some decrease in recall of recent events.  Motor strength is 5/5 bilateral deltoid, bicep, tricep grip. 4/5. Bilateral hip flexor, knee extensor, ankle dorsiflexor. She remembers 2 out of 3 unrelated objects after 2 minute delay. Affect is inappropriate. No evidence of lability or agitation  Results for orders placed or performed during the hospital encounter of 10/22/16 (from  the past 24 hour(s))  Glucose, capillary     Status: Abnormal   Collection Time: 10/28/16 11:42 AM  Result Value Ref Range   Glucose-Capillary 146 (H) 65 - 99 mg/dL  Comment 1 Notify RN    Comment 2 Document in Chart   Glucose, capillary     Status: Abnormal   Collection Time: 10/28/16  4:06 PM  Result Value Ref Range   Glucose-Capillary 166 (H) 65 - 99 mg/dL  Glucose, capillary     Status: Abnormal   Collection Time: 10/28/16  9:46 PM  Result Value Ref Range   Glucose-Capillary 126 (H) 65 - 99 mg/dL   Comment 1 Notify RN    Comment 2 Document in Chart   Comprehensive metabolic panel     Status: Abnormal   Collection Time: 10/29/16  4:30 AM  Result Value Ref Range   Sodium 139 135 - 145 mmol/L   Potassium 3.5 3.5 - 5.1 mmol/L   Chloride 106 101 - 111 mmol/L   CO2 25 22 - 32 mmol/L   Glucose, Bld 138 (H) 65 - 99 mg/dL   BUN 16 6 - 20 mg/dL   Creatinine, Ser 0.74 0.44 - 1.00 mg/dL   Calcium 8.4 (L) 8.9 - 10.3 mg/dL   Total Protein 5.0 (L) 6.5 - 8.1 g/dL   Albumin 2.5 (L) 3.5 - 5.0 g/dL   AST 20 15 - 41 U/L   ALT 31 14 - 54 U/L   Alkaline Phosphatase 50 38 - 126 U/L   Total Bilirubin 0.5 0.3 - 1.2 mg/dL   GFR calc non Af Amer >60 >60 mL/min   GFR calc Af Amer >60 >60 mL/min   Anion gap 8 5 - 15  Glucose, capillary     Status: Abnormal   Collection Time: 10/29/16  6:03 AM  Result Value Ref Range   Glucose-Capillary 134 (H) 65 - 99 mg/dL   Comment 1 Notify RN    Comment 2 Document in Chart    No results found.  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 Does the need for close, 24 hr/day medical supervision in concert with the patient's rehab needs make it unreasonable for this patient to be served in a less intensive setting? Yes 2. Co-Morbidities requiring supervision/potential complications: Hypertension, bacteremia 3. Due to bladder management, bowel management, safety, skin/wound care, disease management,  medication administration, pain management and patient education, does the patient require 24 hr/day rehab nursing? Yes 4. Does the patient require coordinated care of a physician, rehab nurse, PT (1-2 hrs/day, 5 days/week), OT (1-2 hrs/day, 5 days/week) and SLP (.5-1 hrs/day, 5 days/week) to address physical and functional deficits in the context of the above medical diagnosis(es)? Yes Addressing deficits in the following areas: balance, endurance, locomotion, strength, transferring, bowel/bladder control, bathing, dressing, feeding, grooming, toileting, cognition and psychosocial support 5. Can the patient actively participate in an intensive therapy program of at least 3 hrs of therapy per day at least 5 days per week? Yes 6. The potential for patient to make measurable gains while on inpatient rehab is excellent 7. Anticipated functional outcomes upon discharge from inpatient rehab are modified independent  with PT, modified independent with OT, modified independent with SLP. 8. Estimated rehab length of stay to reach the above functional goals is: 7d 9. Does the patient have adequate social supports and living environment to accommodate these discharge functional goals? Yes 10. Anticipated D/C setting: Home 11. Anticipated post D/C treatments: [place] therapy 12. Overall Rehab/Functional Prognosis: excellent  RECOMMENDATIONS: This patient's condition is appropriate for continued rehabilitative care in the following setting: CIR Patient has agreed to participate in recommended program. Yes Note that  insurance prior authorization may be required for reimbursement for recommended care.  Comment:   Charlett Blake M.D. [facility] FAAPM&R (Sports Med, Neuromuscular Med) Diplomate Am Board of Electrodiagnostic Med    Cathlyn Parsons., PA-C 10/29/2016

## 2016-10-29 NOTE — Care Management Important Message (Signed)
Important Message  Patient Details  Name: Alexandria [name] MRN: [id number] Date of Birth: [birth year]   Medicare Important Message Given:  Yes    Treveon Bourcier Montine Circle 10/29/2016, 11:28 AM

## 2016-10-29 NOTE — Progress Notes (Signed)
Rehab Admissions Coordinator Note:  Patient was screened by Retta Diones for appropriateness for an Inpatient Acute Rehab Consult.  At this time, we are recommending Inpatient Rehab consult.  In addition, will need an OT evaluation.  Retta Diones 10/29/2016, 8:23 AM  I can be reached at [phone].

## 2016-10-29 NOTE — Progress Notes (Signed)
PROGRESS NOTE   Alexandria [name]  ZYS:[id number]    DOB: 01/27/1946    DOA: 10/22/2016  PCP: Osborne Casco, MD   I have briefly reviewed patients previous medical records in [facility].  Brief Narrative:  71 year old female, lives alone and independent of activities of daily living, PMH of remote history of breast cancer, HTN, presented to ED with altered mental status, difficulty communicating, confusion, left frontal temporal headache ongoing for few days prior to admission. In the ED CT head demonstrated masslike lesion in the left frontal lobe with surrounding vasogenic edema with localized mass effect. Oncology was concerned about malignancy and suggested MRI brain, steroids and neurosurgery consultation. MRI brain concerning for abscess versus malignancy. S/P Craniotomy and brain abscess evacuation 10/27/16.  Assessment & Plan:   Principal Problem:   Brain abscess Active Problems:   Acute encephalopathy   Hx of breast cancer   Essential hypertension   Hypokalemia   Hyperglycemia   Brain tumor ([employer])   Left frontal lobe ring-enhancing mass, brain abscess status post craniotomy 3/26 - CT head without contrast 10/22/16: Left frontal lobe mass lesion to by 7 x 2.5 cm with surrounding vasogenic edema, highly suspicious for neoplastic mass. Local mass effect within the left frontal lobe with associated mild rightward midline shift. No intracranial hemorrhage. - MRI brain 3/21: Irregular peripherally enhancing mass lesion in the anterior left frontal lobe measuring 2.6 x 3 x 2 cm with associated enhancement of the adjacent dura and central restricted diffusion most consistent with an abscess. Neoplasm considered less likely. -Surveillance CT chest abdomen and pelvis without acute lesions or lesions suspicious for malignancy. - Brain lab MRI 10/22/16: Results reviewed. - Neurosurgery consultation and follow-up appreciated. She has improved on IV Decadron. Although radiologist  interpreted her brain MRI is most consistent with abscess, given lack of leukocytosis and fever, surgeons are concerned about a primary brain tumor. Keppra initiated.  - S/P Craniotomy and brain abscess evacuation 10/27/16. Gram stain shows gram-positive cocci in pairs & chains. ID consulted, suspect that her solitary left frontal brain abscess was probably due to an episode of bacteremia  , abscess Cultures have grown Streptococcus intermedius. This is often part of a polymicrobial infection. ID  plans on continuing ceftriaxone and metronidazole for a minimum of 6 weeks.. Blood cultures 2: Negative to date. Patient will need Two week office f/u with DrStern for staple removal. Ok to leave incision open to air if not draining; bacitracin ointment and Telfa to site if bleeding or drainage. Ok to wash hair and scalp gently with mild soap  Acute encephalopathy, improving - Secondary to brain lesion as above. On steroids per neurosurgery. Taper Decadron over the next week    As per family, mental status improved but not at baseline and speech has clearly improved post surgery.  Hypokalemia Replaced. Magnesium normal.  Essential hypertension Mildly uncontrolled. Continue Toprol-XL &. Benazepril-HCTZ. When necessary IV hydralazine.  Mild hyperglycemia Probably related to steroids. Monitor CBGs and consider SSI. A1c 5.8.  Sinus tachycardia May have been related to brain lesion. Resolved. TSH normal. DC telemetry. Continue Toprol.    DVT prophylaxis: SCDs  Code Status: Full  Family Communication: Discussed in detail with patient's daughter at bedside. Disposition: CIR?   Consultants:  Neurosurgery EDP discussed with oncology  Infectious Disease  Procedures:  S/P Craniotomy and brain abscess evacuation 10/27/16  Antimicrobials:  IV Ceftriaxone IV Metronidazole   Subjective:  Patient still has some expressive aphasia, word finding difficulty  Objective:  Vitals:   10/28/16 1706  10/28/16 2149 10/29/16 0048 10/29/16 0513  BP: 133/84 (!) 155/73 (!) 152/77 (!) 181/82  Pulse: 76 72 69 69  Resp: 18 18 18 20   Temp: 98 F (36.7 C) 98.2 F (36.8 C) 98.3 F (36.8 C) 98.2 F (36.8 C)  TempSrc: Oral Oral Oral Oral  SpO2: 97% 96% 98% 95%  Weight:      Height:        Examination:  General exam: Pleasant elderly female lying comfortably supine in bed. Respiratory system: Clear to auscultation. Respiratory effort normal. Cardiovascular system: S1 & S2 heard, RRR. No JVD, murmurs, rubs, gallops or clicks. No pedal edema.Telemetry: Sinus rhythm.  Gastrointestinal system: Abdomen is nondistended, soft and nontender. No organomegaly or masses felt. Normal bowel sounds heard. Central nervous system: Alert and oriented. Dysphasia +. Speech more fluent. Extremities: Symmetric 5 x 5 power. Skin: No rashes, lesions or ulcers Psychiatry: Judgement and insight appear normal. Mood & affect appropriate.     Data Reviewed: I have personally reviewed following labs and imaging studies  CBC:  Recent Labs Lab 10/22/16 1835 10/22/16 2111 10/23/16 0248 10/24/16 0435 10/28/16 0554  WBC  --  6.8 6.6 11.3* 9.2  NEUTROABS  --  4.7  --   --   --   HGB 13.3 12.0 13.2 12.4 12.8  HCT 39.0 35.4* 39.9 37.5 38.9  MCV  --  85.7 88.9 88.4 88.0  PLT  --  149* 154 169 818   Basic Metabolic Panel:  Recent Labs Lab 10/22/16 2111 10/23/16 0248 10/23/16 1003 10/24/16 0435 10/28/16 0554 10/29/16 0430  NA 140 140  --  139 137 139  K 2.6* 3.0*  --  4.2 3.6 3.5  CL 109 105  --  104 105 106  CO2 24 21*  --  25 21* 25  GLUCOSE 118* 156*  --  166* 139* 138*  BUN 9 6  --  12 14 16   CREATININE 0.64 0.71  --  0.74 0.72 0.74  CALCIUM 8.8* 8.7*  --  9.3 8.3* 8.4*  MG  --   --  1.8  --   --   --    Liver Function Tests:  Recent Labs Lab 10/22/16 2111 10/24/16 0435 10/29/16 0430  AST 16 20 20   ALT 12* 13* 31  ALKPHOS 49 49 50  BILITOT 1.0 0.7 0.5  PROT 6.6 6.3* 5.0*  ALBUMIN 3.8  3.1* 2.5*   Coagulation Profile:  Recent Labs Lab 10/23/16 0248  INR 1.08   Cardiac Enzymes: No results for input(s): CKTOTAL, CKMB, CKMBINDEX, TROPONINI in the last 168 hours. HbA1C: No results for input(s): HGBA1C in the last 72 hours. CBG:  Recent Labs Lab 10/28/16 0758 10/28/16 1142 10/28/16 1606 10/28/16 2146 10/29/16 0603  GLUCAP 147* 146* 166* 126* 134*    Recent Results (from the past 240 hour(s))  Culture, blood (x 2)     Status: None   Collection Time: 10/23/16  2:55 AM  Result Value Ref Range Status   Specimen Description BLOOD LEFT ANTECUBITAL  Final   Special Requests BOTTLES DRAWN AEROBIC AND ANAEROBIC 10CC EACH  Final   Culture NO GROWTH 5 DAYS  Final   Report Status 10/28/2016 FINAL  Final  Culture, blood (x 2)     Status: None   Collection Time: 10/23/16  3:04 AM  Result Value Ref Range Status   Specimen Description BLOOD LEFT HAND  Final   Special Requests BOTTLES DRAWN AEROBIC AND ANAEROBIC  10CC EACH  Final   Culture NO GROWTH 5 DAYS  Final   Report Status 10/28/2016 FINAL  Final  Culture, Urine     Status: Abnormal   Collection Time: 10/24/16  8:25 AM  Result Value Ref Range Status   Specimen Description URINE, RANDOM  Final   Special Requests NONE  Final   Culture MULTIPLE SPECIES PRESENT, SUGGEST RECOLLECTION (A)  Final   Report Status 10/25/2016 FINAL  Final  MRSA PCR Screening     Status: None   Collection Time: 10/25/16  3:29 PM  Result Value Ref Range Status   MRSA by PCR NEGATIVE NEGATIVE Final    Comment:        The GeneXpert MRSA Assay (FDA approved for NASAL specimens only), is one component of a comprehensive MRSA colonization surveillance program. It is not intended to diagnose MRSA infection nor to guide or monitor treatment for MRSA infections.   Aerobic/Anaerobic Culture (surgical/deep wound)     Status: None (Preliminary result)   Collection Time: 10/27/16  8:48 AM  Result Value Ref Range Status   Specimen  Description ABSCESS  Final   Special Requests LEFT FRONTAL LOBE BRAIN MASS  Final   Gram Stain   Final    ABUNDANT WBC PRESENT, PREDOMINANTLY PMN ABUNDANT GRAM POSITIVE COCCI IN PAIRS IN CHAINS CRITICAL RESULT CALLED TO, READ BACK BY AND VERIFIED WITH: Valarie Merino AT 5284 10/27/16 BY L BENFIELD    Culture   Final    ABUNDANT STREPTOCOCCUS INTERMEDIUS SUSCEPTIBILITIES TO FOLLOW    Report Status PENDING  Incomplete  Aerobic/Anaerobic Culture (surgical/deep wound)     Status: None (Preliminary result)   Collection Time: 10/27/16  9:14 AM  Result Value Ref Range Status   Specimen Description TISSUE  Final   Special Requests LEFT FRONTAL LOBE BRAIN MASS  Final   Gram Stain   Final    MODERATE WBC PRESENT,BOTH PMN AND MONONUCLEAR RARE GRAM POSITIVE COCCI IN PAIRS CRITICAL RESULT CALLED TO, READ BACK BY AND VERIFIED WITH: P LOPEZ,RN AT 1013 10/27/16 BY L BENFIELD    Culture FEW STREPTOCOCCUS INTERMEDIUS  Final   Report Status PENDING  Incomplete         Radiology Studies: No results found.      Scheduled Meds: . benazepril  20 mg Oral Daily   And  . hydrochlorothiazide  25 mg Oral Daily  . cefTRIAXone (ROCEPHIN)  IV  2 g Intravenous Q12H  . dexamethasone  4 mg Oral Q8H  . docusate sodium  100 mg Oral BID  . insulin aspart  0-9 Units Subcutaneous TID WC  . levETIRAcetam  500 mg Intravenous Q12H  . metoprolol succinate  100 mg Oral QPM  . metronidazole  500 mg Intravenous Q8H  . pantoprazole  40 mg Oral Daily   Continuous Infusions: . 0.9 % NaCl with KCl 20 mEq / L 75 mL/hr at 10/28/16 0521     LOS: 7 days      Reyne Dumas, MD,   [facility] Pager [phone]  If 7PM-7AM, please contact night-coverage [email or web address] Password [email or web address] 10/29/2016, 10:03 AM

## 2016-10-29 NOTE — Progress Notes (Signed)
Patient ID: Alexandria [name], female   DOB: [birth year], 71 y.o.   MRN: [id number]         [facility]  Date of Admission:  10/22/2016           Day 3 ceftriaxone        Day 3 metronidazole  Principal Problem:   Brain abscess Active Problems:   Acute encephalopathy   Hx of breast cancer   Essential hypertension   Hypokalemia   Hyperglycemia   Brain tumor ([place])   . benazepril  20 mg Oral Daily   And  . hydrochlorothiazide  25 mg Oral Daily  . cefTRIAXone (ROCEPHIN)  IV  2 g Intravenous Q12H  . dexamethasone  4 mg Oral Q8H  . docusate sodium  100 mg Oral BID  . insulin aspart  0-9 Units Subcutaneous TID WC  . levETIRAcetam  500 mg Intravenous Q12H  . metoprolol succinate  100 mg Oral QPM  . metronidazole  500 mg Intravenous Q8H  . pantoprazole  40 mg Oral Daily  . saccharomyces boulardii  250 mg Oral BID    SUBJECTIVE: She is feeling better today.   Review of Systems: Review of Systems  Constitutional: Negative for chills, diaphoresis and fever.  Gastrointestinal: Negative for abdominal pain, diarrhea, nausea and vomiting.  Neurological: Negative for headaches.    Past Medical History:  Diagnosis Date  . Cancer [facility]) 1996   breast cx  . Hypertension     Social History  Substance Use Topics  . Smoking status: Never Smoker  . Smokeless tobacco: Never Used  . Alcohol use Yes     Comment: occasional    Family History  Problem Relation Age of Onset  . Breast cancer Mother   . Colon cancer Father    Allergies  Allergen Reactions  . Sulfa Antibiotics     OBJECTIVE: Vitals:   10/28/16 2149 10/29/16 0048 10/29/16 0513 10/29/16 1017  BP: (!) 155/73 (!) 152/77 (!) 181/82 (!) 183/75  Pulse: 72 69 69 71  Resp: _0 Temp: 98.2 F (36.8 C) 98.3 F (36.8 C) 98.2 F (36.8 C) 97.8 F (36.6 C)  TempSrc: Oral Oral Oral Oral  SpO2: 96% 98% 95% 97%  Weight:      Height:       Body mass index is 27.48  kg/m.  Physical Exam  Constitutional:  She is sitting up in a chair smiling. She is visiting with family.  Cardiovascular: Normal rate and regular rhythm.   No murmur heard. Pulmonary/Chest: Effort normal and breath sounds normal.  Abdominal: Soft. There is no tenderness.  Neurological: She is alert.  Her speech is much more fluent today.  Skin: No rash noted.   Left arm PICC.  Psychiatric: Mood and affect normal.    Lab Results Lab Results  Component Value Date   WBC 9.2 10/28/2016   HGB 12.8 10/28/2016   HCT 38.9 10/28/2016   MCV 88.0 10/28/2016   PLT 188 10/28/2016    Lab Results  Component Value Date   CREATININE 0.74 10/29/2016   BUN 16 10/29/2016   NA 139 10/29/2016   K 3.5 10/29/2016   CL 106 10/29/2016   CO2 25 10/29/2016    Lab Results  Component Value Date   ALT 31 10/29/2016   AST 20 10/29/2016   ALKPHOS 50 10/29/2016   BILITOT 0.5 10/29/2016     Microbiology: Recent Results (from the past 240 hour(s))  Culture,  blood (x 2)     Status: None   Collection Time: 10/23/16  2:55 AM  Result Value Ref Range Status   Specimen Description BLOOD LEFT ANTECUBITAL  Final   Special Requests BOTTLES DRAWN AEROBIC AND ANAEROBIC 10CC EACH  Final   Culture NO GROWTH 5 DAYS  Final   Report Status 10/28/2016 FINAL  Final  Culture, blood (x 2)     Status: None   Collection Time: 10/23/16  3:04 AM  Result Value Ref Range Status   Specimen Description BLOOD LEFT HAND  Final   Special Requests BOTTLES DRAWN AEROBIC AND ANAEROBIC 10CC EACH  Final   Culture NO GROWTH 5 DAYS  Final   Report Status 10/28/2016 FINAL  Final  Culture, Urine     Status: Abnormal   Collection Time: 10/24/16  8:25 AM  Result Value Ref Range Status   Specimen Description URINE, RANDOM  Final   Special Requests NONE  Final   Culture MULTIPLE SPECIES PRESENT, SUGGEST RECOLLECTION (A)  Final   Report Status 10/25/2016 FINAL  Final  MRSA PCR Screening     Status: None   Collection Time:  10/25/16  3:29 PM  Result Value Ref Range Status   MRSA by PCR NEGATIVE NEGATIVE Final    Comment:        The GeneXpert MRSA Assay (FDA approved for NASAL specimens only), is one component of a comprehensive MRSA colonization surveillance program. It is not intended to diagnose MRSA infection nor to guide or monitor treatment for MRSA infections.   Aerobic/Anaerobic Culture (surgical/deep wound)     Status: None (Preliminary result)   Collection Time: 10/27/16  8:48 AM  Result Value Ref Range Status   Specimen Description ABSCESS  Final   Special Requests LEFT FRONTAL LOBE BRAIN MASS  Final   Gram Stain   Final    ABUNDANT WBC PRESENT, PREDOMINANTLY PMN ABUNDANT GRAM POSITIVE COCCI IN PAIRS IN CHAINS CRITICAL RESULT CALLED TO, READ BACK BY AND VERIFIED WITH: J KEY,RN AT 4818 10/27/16 BY L BENFIELD    Culture   Final    ABUNDANT STREPTOCOCCUS INTERMEDIUS NO ANAEROBES ISOLATED; CULTURE IN PROGRESS FOR 5 DAYS    Report Status PENDING  Incomplete   Organism ID, Bacteria STREPTOCOCCUS INTERMEDIUS  Final      Susceptibility   Streptococcus intermedius - MIC*    PENICILLIN <=0.06 SENSITIVE Sensitive     CEFTRIAXONE 0.25 SENSITIVE Sensitive     ERYTHROMYCIN >=8 RESISTANT Resistant     LEVOFLOXACIN 1 SENSITIVE Sensitive     VANCOMYCIN 0.5 SENSITIVE Sensitive     * ABUNDANT STREPTOCOCCUS INTERMEDIUS  Aerobic/Anaerobic Culture (surgical/deep wound)     Status: None (Preliminary result)   Collection Time: 10/27/16  9:14 AM  Result Value Ref Range Status   Specimen Description TISSUE  Final   Special Requests LEFT FRONTAL LOBE BRAIN MASS  Final   Gram Stain   Final    MODERATE WBC PRESENT,BOTH PMN AND MONONUCLEAR RARE GRAM POSITIVE COCCI IN PAIRS CRITICAL RESULT CALLED TO, READ BACK BY AND VERIFIED WITH: P LOPEZ,RN AT 1013 10/27/16 BY L BENFIELD    Culture   Final    FEW STREPTOCOCCUS INTERMEDIUS SUSCEPTIBILITIES PERFORMED ON PREVIOUS CULTURE WITHIN THE LAST 5 DAYS. NO ANAEROBES  ISOLATED; CULTURE IN PROGRESS FOR 5 DAYS    Report Status PENDING  Incomplete     ASSESSMENT: She has brain abscess. Cultures have grown Streptococcus intermedius. This is often part of a polymicrobial infection. I plan on  continuing ceftriaxone and metronidazole for a minimum of 6 weeks. I will change metronidazole to oral form now. She does occasionally drink wine but does not feel that will be a problem to avoid alcohol while she is on metronidazole. At a lengthy discussion with her family about the nature of this brain abscess and its treatment.  PLAN: 1. Continue current antibiotics  Diagnosis: Brain abscess  Culture Result: Streptococcus intermedius  Allergies  Allergen Reactions  . Sulfa Antibiotics     Discharge antibiotics:IV ceftriaxone and oral metronidazole Per pharmacy protocol Ceftriaxone  Duration: 6 weeks End Date: 12/08/2016  [facility] Care Per Protocol:  Labs weekly while on IV antibiotics: _x_ CBC with differential _x_ BMP __ CMP __ CRP __ ESR __ Vancomycin trough  __ Please pull PIC at completion of IV antibiotics _x_ Please leave PIC in place until doctor has seen patient or been notified  Fax weekly labs to [phone]  Clinic Follow Up Appt: I will arrange follow-up in my clinic within 3-4 weeks  Michel Bickers, MD [facility] for [place] [phone] pager   [phone] cell 10/29/2016, 2:11 PM

## 2016-10-29 NOTE — Progress Notes (Signed)
OT Cancellation Note  Patient Details Name: Alexandria [name] MRN: [id number] DOB: 1946-01-29   Cancelled Treatment:    Reason Eval/Treat Not Completed: Medical issues which prohibited therapy. Pt with elevated Blood Pressure (210/105), RN notified MD and will hold on therapy until tomorrow.   Merri Ray Paxon Propes 10/29/2016, 4:36 PM  Hulda Humphrey OTR/L [phone]

## 2016-10-29 NOTE — Progress Notes (Addendum)
Subjective: Patient reports "I'm doing well"  Objective: Vital signs in last 24 hours: Temp:  [97.8 F (36.6 C)-98.3 F (36.8 C)] 97.8 F (36.6 C) (03/28 1017) Pulse Rate:  [68-76] 71 (03/28 1017) Resp:  [18-20] 18 (03/28 1017) BP: (133-183)/(73-84) 183/75 (03/28 1017) SpO2:  [93 %-98 %] 97 % (03/28 1017)  Intake/Output from previous day: 03/27 0701 - 03/28 0700 In: 1890 [P.O.:600; I.V.:835; IV Piggyback:455] Out: 302 [Urine:302] Intake/Output this shift: Total I/O In: 390 [P.O.:240; IV Piggyback:150] Out: 2 [Urine:1; Stool:1]  Sitting in chair, smiling. Responds to questions appropriately using sentences, occasionally repeating nurse's question. MAEW. PEARL. Stapled incision without erythema, swelling, or drainage; well-approximated.  Family present, very attentive.   Lab Results:  Recent Labs  10/28/16 0554  WBC 9.2  HGB 12.8  HCT 38.9  PLT 188   BMET  Recent Labs  10/28/16 0554 10/29/16 0430  NA 137 139  K 3.6 3.5  CL 105 106  CO2 21* 25  GLUCOSE 139* 138*  BUN 14 16  CREATININE 0.72 0.74  CALCIUM 8.3* 8.4*    Studies/Results: No results found.  Assessment/Plan: Improving  LOS: 7 days  Possibly going to CIR. IVAB per PICC. Two week office f/u with DrStern for staple removal. Ok to leave incision open to air if not draining; bacitracin ointment and Telfa to site if bleeding or drainage. Ok to wash hair and scalp gently with mild soap.    Verdis Prime 10/29/2016, 11:16 AM   Patient is continuing to improve.  I agree with plan for Rehab.

## 2016-10-29 NOTE — Progress Notes (Addendum)
   10/29/16 1730  Vitals  BP (!) 189/105  Pulse Rate 82   Medicated with scheduled Toprol 100mg . Pt. Remains asymptomatic.

## 2016-10-29 NOTE — Progress Notes (Addendum)
   10/29/16 1630  Vitals  BP (!) 187/129  BP Location Right Leg  BP Method Automatic  Patient Position (if appropriate) Lying  Pulse Rate 78   Medicated with PRN Hydralazine. Pt. Asymptomatic.

## 2016-10-29 NOTE — Progress Notes (Signed)
   10/29/16 1829  Vitals  Temp 97.8 F (36.6 C)  Temp Source Oral  BP (!) 185/120  BP Location Left Wrist (RN approved )  BP Method Automatic  Patient Position (if appropriate) Lying  Pulse Rate 92  Pulse Rate Source Dinamap  Resp 20  Oxygen Therapy  SpO2 98 %  O2 Device Room Air   Called Rapid d/t continued increase BP, question accuracy of leg BP. Spoke w/Debbie, Therapist, sports. Okayed for one-time left lower arm BP. BP remains elevated. Discussed with Rapid again, given PRN Labetalol. Will ask on-coming nurse to re-check BP.

## 2016-10-29 NOTE — Progress Notes (Signed)
Assumed care of patient from [place], [place]. Pt. Currently in room, bed alarm on.

## 2016-10-30 ENCOUNTER — Encounter (HOSPITAL_COMMUNITY): Payer: Self-pay

## 2016-10-30 ENCOUNTER — Inpatient Hospital Stay (HOSPITAL_COMMUNITY)
Admission: RE | Admit: 2016-10-30 | Discharge: 2016-11-07 | DRG: 884 | Disposition: A | Discharge status: Home Health | Payer: Medicare Other | Source: Intra-hospital | Attending: Physical Medicine & Rehabilitation | Admitting: Physical Medicine & Rehabilitation

## 2016-10-30 DIAGNOSIS — E876 Hypokalemia: Secondary | ICD-10-CM | POA: Diagnosis present

## 2016-10-30 DIAGNOSIS — R739 Hyperglycemia, unspecified: Secondary | ICD-10-CM | POA: Diagnosis present

## 2016-10-30 DIAGNOSIS — K5900 Constipation, unspecified: Secondary | ICD-10-CM | POA: Diagnosis present

## 2016-10-30 DIAGNOSIS — T380X5A Adverse effect of glucocorticoids and synthetic analogues, initial encounter: Secondary | ICD-10-CM | POA: Diagnosis present

## 2016-10-30 DIAGNOSIS — D479 Neoplasm of uncertain behavior of lymphoid, hematopoietic and related tissue, unspecified: Secondary | ICD-10-CM

## 2016-10-30 DIAGNOSIS — Z853 Personal history of malignant neoplasm of breast: Secondary | ICD-10-CM

## 2016-10-30 DIAGNOSIS — Z79899 Other long term (current) drug therapy: Secondary | ICD-10-CM

## 2016-10-30 DIAGNOSIS — T82868A Thrombosis of vascular prosthetic devices, implants and grafts, initial encounter: Secondary | ICD-10-CM | POA: Diagnosis not present

## 2016-10-30 DIAGNOSIS — R4189 Other symptoms and signs involving cognitive functions and awareness: Principal | ICD-10-CM | POA: Diagnosis present

## 2016-10-30 DIAGNOSIS — Z90710 Acquired absence of both cervix and uterus: Secondary | ICD-10-CM

## 2016-10-30 DIAGNOSIS — I10 Essential (primary) hypertension: Secondary | ICD-10-CM | POA: Diagnosis present

## 2016-10-30 DIAGNOSIS — G060 Intracranial abscess and granuloma: Secondary | ICD-10-CM

## 2016-10-30 DIAGNOSIS — M7989 Other specified soft tissue disorders: Secondary | ICD-10-CM | POA: Diagnosis not present

## 2016-10-30 DIAGNOSIS — E871 Hypo-osmolality and hyponatremia: Secondary | ICD-10-CM | POA: Diagnosis present

## 2016-10-30 DIAGNOSIS — Z9011 Acquired absence of right breast and nipple: Secondary | ICD-10-CM

## 2016-10-30 DIAGNOSIS — Z9889 Other specified postprocedural states: Secondary | ICD-10-CM

## 2016-10-30 DIAGNOSIS — B954 Other streptococcus as the cause of diseases classified elsewhere: Secondary | ICD-10-CM | POA: Diagnosis not present

## 2016-10-30 DIAGNOSIS — F419 Anxiety disorder, unspecified: Secondary | ICD-10-CM | POA: Diagnosis present

## 2016-10-30 DIAGNOSIS — Z803 Family history of malignant neoplasm of breast: Secondary | ICD-10-CM | POA: Diagnosis not present

## 2016-10-30 DIAGNOSIS — D72820 Lymphocytosis (symptomatic): Secondary | ICD-10-CM | POA: Diagnosis not present

## 2016-10-30 DIAGNOSIS — A491 Streptococcal infection, unspecified site: Secondary | ICD-10-CM

## 2016-10-30 DIAGNOSIS — A498 Other bacterial infections of unspecified site: Secondary | ICD-10-CM

## 2016-10-30 DIAGNOSIS — B9689 Other specified bacterial agents as the cause of diseases classified elsewhere: Secondary | ICD-10-CM | POA: Diagnosis not present

## 2016-10-30 DIAGNOSIS — R55 Syncope and collapse: Secondary | ICD-10-CM | POA: Diagnosis not present

## 2016-10-30 DIAGNOSIS — B2790 Infectious mononucleosis, unspecified without complication: Secondary | ICD-10-CM

## 2016-10-30 DIAGNOSIS — R269 Unspecified abnormalities of gait and mobility: Secondary | ICD-10-CM | POA: Diagnosis not present

## 2016-10-30 LAB — GLUCOSE, CAPILLARY
GLUCOSE-CAPILLARY: 134 mg/dL — AB (ref 65–99)
GLUCOSE-CAPILLARY: 100 mg/dL — AB (ref 65–99)
Glucose-Capillary: 129 mg/dL — ABNORMAL HIGH (ref 65–99)
Glucose-Capillary: 126 mg/dL — ABNORMAL HIGH (ref 65–99)
Glucose-Capillary: 117 mg/dL — ABNORMAL HIGH (ref 65–99)

## 2016-10-30 MED ORDER — ACETAMINOPHEN 325 MG PO TABS: 650.0000 mg | ORAL_TABLET | ORAL | Status: DC | PRN

## 2016-10-30 MED ORDER — HYDROCHLOROTHIAZIDE 25 MG PO TABS
25.0000 mg | ORAL_TABLET | Freq: Every day | ORAL | Status: DC
  Administered 2016-10-31 – 2016-11-06 (×7): 25 mg via ORAL
  Filled 2016-10-30 (×7): qty 1

## 2016-10-30 MED ORDER — DEXTROSE 5 % IV SOLN: 2.0000 g | Freq: Two times a day (BID) | INTRAVENOUS | 0 refills | Status: DC

## 2016-10-30 MED ORDER — INSULIN ASPART 100 UNIT/ML ~~LOC~~ SOLN
0.0000 [IU] | Freq: Three times a day (TID) | SUBCUTANEOUS | Status: DC
  Administered 2016-10-31: 1 [IU] via SUBCUTANEOUS
  Administered 2016-10-31: 2 [IU] via SUBCUTANEOUS
  Administered 2016-11-01: 1 [IU] via SUBCUTANEOUS
  Administered 2016-11-02: 2 [IU] via SUBCUTANEOUS
  Administered 2016-11-03 – 2016-11-06 (×6): 1 [IU] via SUBCUTANEOUS
  Administered 2016-11-06: 5 [IU] via SUBCUTANEOUS
  Administered 2016-11-06: 2 [IU] via SUBCUTANEOUS
  Administered 2016-11-07: 1 [IU] via SUBCUTANEOUS

## 2016-10-30 MED ORDER — PANTOPRAZOLE SODIUM 40 MG PO TBEC
40.0000 mg | DELAYED_RELEASE_TABLET | Freq: Every day | ORAL | Status: DC
  Administered 2016-10-31 – 2016-11-07 (×8): 40 mg via ORAL
  Filled 2016-10-30 (×8): qty 1

## 2016-10-30 MED ORDER — SODIUM CHLORIDE 0.9% FLUSH
10.0000 mL | INTRAVENOUS | Status: DC | PRN
  Administered 2016-10-30 – 2016-11-06 (×8): 10 mL
  Filled 2016-10-30 (×7): qty 40

## 2016-10-30 MED ORDER — BENAZEPRIL HCL 40 MG PO TABS: 40.0000 mg | ORAL_TABLET | Freq: Every day | ORAL | 1 refills | Status: DC

## 2016-10-30 MED ORDER — BENAZEPRIL HCL 20 MG PO TABS
40.0000 mg | ORAL_TABLET | Freq: Every day | ORAL | Status: DC
  Filled 2016-10-30: qty 2

## 2016-10-30 MED ORDER — BENAZEPRIL HCL 20 MG PO TABS: 20.0000 mg | ORAL_TABLET | Freq: Every day | ORAL | Status: DC

## 2016-10-30 MED ORDER — LEVETIRACETAM 500 MG PO TABS
500.0000 mg | ORAL_TABLET | Freq: Two times a day (BID) | ORAL | Status: DC
  Administered 2016-10-30 – 2016-11-07 (×16): 500 mg via ORAL
  Filled 2016-10-30 (×17): qty 1

## 2016-10-30 MED ORDER — METOPROLOL SUCCINATE ER 50 MG PO TB24
100.0000 mg | ORAL_TABLET | Freq: Every evening | ORAL | Status: DC
  Administered 2016-10-31 – 2016-11-05 (×6): 100 mg via ORAL
  Filled 2016-10-30 (×6): qty 2

## 2016-10-30 MED ORDER — DEXAMETHASONE 4 MG PO TABS
4.0000 mg | ORAL_TABLET | Freq: Two times a day (BID) | ORAL | Status: DC
  Administered 2016-10-30 – 2016-11-06 (×14): 4 mg via ORAL
  Filled 2016-10-30 (×14): qty 1

## 2016-10-30 MED ORDER — SACCHAROMYCES BOULARDII 250 MG PO CAPS
250.0000 mg | ORAL_CAPSULE | Freq: Two times a day (BID) | ORAL | Status: DC
  Administered 2016-10-30 – 2016-11-07 (×16): 250 mg via ORAL
  Filled 2016-10-30 (×16): qty 1

## 2016-10-30 MED ORDER — ACETAMINOPHEN 650 MG RE SUPP: 650.0000 mg | RECTAL | Status: DC | PRN

## 2016-10-30 MED ORDER — HYDROCHLOROTHIAZIDE 25 MG PO TABS: 25.0000 mg | ORAL_TABLET | Freq: Every day | ORAL | 1 refills | Status: DC

## 2016-10-30 MED ORDER — METRONIDAZOLE 500 MG PO TABS: 500.0000 mg | ORAL_TABLET | Freq: Three times a day (TID) | ORAL | 1 refills | Status: DC

## 2016-10-30 MED ORDER — PANTOPRAZOLE SODIUM 40 MG PO TBEC: 40.0000 mg | DELAYED_RELEASE_TABLET | Freq: Every day | ORAL | 1 refills | Status: DC

## 2016-10-30 MED ORDER — ONDANSETRON HCL 4 MG PO TABS: 4.0000 mg | ORAL_TABLET | Freq: Four times a day (QID) | ORAL | Status: DC | PRN

## 2016-10-30 MED ORDER — DEXAMETHASONE 4 MG PO TABS: ORAL_TABLET | ORAL | 0 refills | Status: DC

## 2016-10-30 MED ORDER — BISACODYL 10 MG RE SUPP: 10.0000 mg | Freq: Every day | RECTAL | Status: DC | PRN

## 2016-10-30 MED ORDER — ALPRAZOLAM 0.25 MG PO TABS: 0.1250 mg | ORAL_TABLET | Freq: Every evening | ORAL | Status: DC | PRN

## 2016-10-30 MED ORDER — SORBITOL 70 % SOLN: 30.0000 mL | Freq: Every day | Status: DC | PRN

## 2016-10-30 MED ORDER — LEVETIRACETAM 500 MG PO TABS: 500.0000 mg | ORAL_TABLET | Freq: Two times a day (BID) | ORAL | 1 refills | Status: DC

## 2016-10-30 MED ORDER — HYDROCODONE-ACETAMINOPHEN 5-325 MG PO TABS: 1.0000 | ORAL_TABLET | ORAL | 0 refills | Status: DC | PRN

## 2016-10-30 MED ORDER — HYDROCODONE-ACETAMINOPHEN 5-325 MG PO TABS
1.0000 | ORAL_TABLET | ORAL | Status: DC | PRN
  Administered 2016-10-30 – 2016-11-07 (×10): 1 via ORAL
  Filled 2016-10-30 (×10): qty 1

## 2016-10-30 MED ORDER — BENAZEPRIL HCL 40 MG PO TABS
40.0000 mg | ORAL_TABLET | Freq: Every day | ORAL | Status: DC
  Administered 2016-10-31 – 2016-11-06 (×7): 40 mg via ORAL
  Filled 2016-10-30 (×7): qty 1

## 2016-10-30 MED ORDER — DEXAMETHASONE 4 MG PO TABS: 4.0000 mg | ORAL_TABLET | Freq: Two times a day (BID) | ORAL | Status: DC

## 2016-10-30 MED ORDER — HYDROCHLOROTHIAZIDE 25 MG PO TABS: 25.0000 mg | ORAL_TABLET | Freq: Every day | ORAL | Status: DC

## 2016-10-30 MED ORDER — DEXTROSE 5 % IV SOLN
2.0000 g | Freq: Two times a day (BID) | INTRAVENOUS | Status: DC
  Administered 2016-10-30 – 2016-11-07 (×16): 2 g via INTRAVENOUS
  Filled 2016-10-30 (×18): qty 2

## 2016-10-30 MED ORDER — ONDANSETRON HCL 4 MG/2ML IJ SOLN: 4.0000 mg | Freq: Four times a day (QID) | INTRAMUSCULAR | Status: DC | PRN

## 2016-10-30 MED ORDER — METRONIDAZOLE 500 MG PO TABS
500.0000 mg | ORAL_TABLET | Freq: Three times a day (TID) | ORAL | Status: DC
  Administered 2016-10-31 – 2016-11-05 (×17): 500 mg via ORAL
  Filled 2016-10-30 (×19): qty 1

## 2016-10-30 MED ORDER — CEFTRIAXONE IV (FOR PTA / DISCHARGE USE ONLY): 2.0000 g | Freq: Two times a day (BID) | INTRAVENOUS | 0 refills | Status: DC

## 2016-10-30 MED ORDER — SACCHAROMYCES BOULARDII 250 MG PO CAPS: 250.0000 mg | ORAL_CAPSULE | Freq: Two times a day (BID) | ORAL | 0 refills | Status: AC

## 2016-10-30 NOTE — Progress Notes (Signed)
Resume care from previous RN, pt is now transporting to [id number]. All belongings sent with patient. Family at bedside.  Ave Filter, RN

## 2016-10-30 NOTE — Progress Notes (Signed)
PHARMACY CONSULT NOTE FOR:  OUTPATIENT  PARENTERAL ANTIBIOTIC THERAPY (OPAT)  Indication:  brain abscess Regimen:  Ceftriaxone 2gm IV q12hrs and Metronidazole 500 mg PO q8hrs End date:  12/08/16  IV antibiotic discharge orders are pended. To discharging provider:  please sign these orders via discharge navigator,  Select New Orders & click on the button choice - Manage This Unsigned Work.    To transfer to CIR today.  Thank you for allowing pharmacy to be a part of this patient's care.  Arty Baumgartner, [place] Pager: [phone] 10/30/2016, 12:42 PM

## 2016-10-30 NOTE — Progress Notes (Signed)
Inpatient Rehabilitation  I have insurance authorization, medical clearance, and a bed available for today.  Will proceed with IP Rehab admission.  Please call with questions.   Carmelia Roller., CCC/SLP Admission Coordinator  [place]  Cell [phone]

## 2016-10-30 NOTE — Progress Notes (Signed)
Gunn[facility] Alexandria [name] Rehab Admission Coordinator Signed Physical Medicine and Rehabilitation  PMR Pre-admission Date of Service: 10/30/2016 1:48 PM  Related encounter: ED to Hosp-Admission (Current) from 10/22/2016 in [place]       [] Hide copied text PMR Admission Coordinator Pre-Admission Assessment  Patient: Alexandria [name] is an 71 y.o., female MRN: [id number] DOB: 25-Jun-1946 Height: 5\' 5"  (165.1 cm) Weight: 74.9 kg (165 lb 2 oz)                                                                                                                                                  Insurance Information HMO:     PPO: X     PCP:      IPA:      80/20:      OTHER:  PRIMARY: U[employer] Medicare       Policy#: [id number]      Subscriber: Self CM Name: Sherlynn Stalls       Phone#: [phone]     Fax#: [phone] Pre-Cert#: [id number], authorization given by Orvan July      Employer:  Benefits:  Phone #: verified online     Name:  Pur[place]. Date: 08/04/16     Deduct: $0      Out of Pocket Max: $4,500      Life Max: N/A CIR: $345 a day, days 1-5; $0 a day, days 50-100      SNF: $0 a day, days 1-20; $160 a day, days 21-49; $0 a day, days 50-100 Outpatient: PT/OT/SLP     Co-Pay: $40 per visit  Home Health: PT/OT/SLP      Co-Pay: $0 DME: 80%     Co-Pay: 20% Providers: in network   Medicaid Application Date:       Case Manager:  Disability Application Date:       Case Worker:   Emergency Contact Information        Contact Information    Name Relation Home Work [place] Daughter [phone]       Current Medical History  Patient Admitting Diagnosis: 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 Per chart review patient lives alone and was independent prior to admission. 2  level home with bedroom on main level. Family in area to assist. Presented 10/22/2016 with altered mental status that progressed over the past few days. CT imaging demonstrated a mass like lesion in the left frontal lobe with surrounding vasogenic edema and localized mass effect question malignancy versus abscess. CT of abdomen and pelvis as well as CT of chest negative for acute findings. Neurosurgery consulted and underwent left craniotomy tumor excision 10/27/2016 per Dr. Vertell Limber. Infectious disease consulted with  blood cultures showing Streptococcus intermediusand presently on Intravenous ceftriaxone and oral metronidazoleanticipate 6 weeks of antibiotic care. Decadron protocol as directed.Keppra added for seizure prophylaxis.Tolerating a regular diet. Physical and occupational therapy evaluation completed with recommendations of physical medicine rehabilitation consult.Patient was admitted for a comprehensive rehabilitation program.  Past Medical History      Past Medical History:  Diagnosis Date  . Cancer [facility]) 1996   breast cx  . Hypertension     Family History  family history includes Breast cancer in her mother; Colon cancer in her father.  Prior Rehab/Hospitalizations:  Has the patient had major surgery during 100 days prior to admission? No  Current Medications   Current Facility-Administered Medications:  .  0.9 % NaCl with KCl 20 mEq/ L  infusion, , Intravenous, Continuous, Erline Levine, MD, Last Rate: 75 mL/hr at 10/30/16 0359 .  acetaminophen (TYLENOL) tablet 650 mg, 650 mg, Oral, Q4H PRN, 650 mg at 10/27/16 2151 **OR** acetaminophen (TYLENOL) suppository 650 mg, 650 mg, Rectal, Q4H PRN, Erline Levine, MD .  ALPRAZolam Duanne Moron) tablet 0.125-0.25 mg, 0.125-0.25 mg, Oral, QHS PRN, Karmen Bongo, MD, 0.25 mg at 10/26/16 2137 .  [START ON 10/31/2016] benazepril (LOTENSIN) tablet 40 mg, 40 mg, Oral, Daily **AND** [START ON 10/31/2016] hydrochlorothiazide (HYDRODIURIL) tablet  25 mg, 25 mg, Oral, Daily, Reyne Dumas, MD .  bisacodyl (DULCOLAX) suppository 10 mg, 10 mg, Rectal, Daily PRN, Erline Levine, MD .  cefTRIAXone (ROCEPHIN) 2 g in dextrose 5 % 50 mL IVPB, 2 g, Intravenous, Q12H, Michel Bickers, MD, 2 g at 10/30/16 0929 .  dexamethasone (DECADRON) tablet 4 mg, 4 mg, Oral, Q12H, Reyne Dumas, MD .  docusate sodium (COLACE) capsule 100 mg, 100 mg, Oral, BID, Erline Levine, MD, 100 mg at 10/29/16 2223 .  hydrALAZINE (APRESOLINE) injection 5 mg, 5 mg, Intravenous, Q4H PRN, Karmen Bongo, MD, 5 mg at 10/29/16 1636 .  HYDROcodone-acetaminophen (NORCO/VICODIN) 5-325 MG per tablet 1 tablet, 1 tablet, Oral, Q4H PRN, Erline Levine, MD, 1 tablet at 10/29/16 2335 .  insulin aspart (novoLOG) injection 0-9 Units, 0-9 Units, Subcutaneous, TID WC, Modena Jansky, MD, 1 Units at 10/30/16 1157 .  labetalol (NORMODYNE,TRANDATE) injection 10-40 mg, 10-40 mg, Intravenous, Q10 min PRN, Erline Levine, MD, 10 mg at 10/29/16 1902 .  levETIRAcetam (KEPPRA) tablet 500 mg, 500 mg, Oral, BID, Erline Levine, MD, 500 mg at 10/30/16 7510 .  metoprolol succinate (TOPROL-XL) 24 hr tablet 100 mg, 100 mg, Oral, QPM, Karmen Bongo, MD, 100 mg at 10/29/16 1659 .  metroNIDAZOLE (FLAGYL) tablet 500 mg, 500 mg, Oral, Q8H, Michel Bickers, MD, 500 mg at 10/30/16 0921 .  morphine 2 MG/ML injection 1-2 mg, 1-2 mg, Intravenous, Q2H PRN, Erline Levine, MD, 1 mg at 10/28/16 0759 .  ondansetron (ZOFRAN) tablet 4 mg, 4 mg, Oral, Q4H PRN **OR** ondansetron (ZOFRAN) injection 4 mg, 4 mg, Intravenous, Q4H PRN, Erline Levine, MD .  pantoprazole (PROTONIX) EC tablet 40 mg, 40 mg, Oral, Daily, Reyne Dumas, MD, 40 mg at 10/30/16 0921 .  polyethylene glycol (MIRALAX / GLYCOLAX) packet 17 g, 17 g, Oral, Daily PRN, Erline Levine, MD .  promethazine (PHENERGAN) tablet 12.5-25 mg, 12.5-25 mg, Oral, Q4H PRN, Erline Levine, MD .  saccharomyces boulardii (FLORASTOR) capsule 250 mg, 250 mg, Oral, BID, Reyne Dumas, MD, 250 mg at  10/30/16 0921 .  sodium chloride flush (NS) 0.9 % injection 10-40 mL, 10-40 mL, Intracatheter, PRN, Reyne Dumas, MD, 10 mL at 10/29/16 0440 .  sodium phosphate (FLEET) 7-19 GM/118ML enema  1 enema, 1 enema, Rectal, Once PRN, Erline Levine, MD  Patients Current Diet: Diet regular Room service appropriate? Yes; Fluid consistency: Thin Diet - low sodium heart healthy  Precautions / Restrictions Precautions Precautions: Fall Restrictions Weight Bearing Restrictions: No   Has the patient had 2 or more falls or a fall with injury in the past year?No  Prior Activity Level Community (5-7x/wk): Prior to admission patient independent with all ADLs and IADLs.  SHe was very active caring for her grandchildren, cooking, cleaning, and doing yoga.    Home Assistive Devices / Equipment Home Assistive Devices/Equipment: Eyeglasses Home Equipment: Shower seat - built in  Prior Device Use: Indicate devices/aids used by the patient prior to current illness, exacerbation or injury? None of the above  Prior Functional Level Prior Function Level of Independence: Independent  Self Care: Did the patient need help bathing, dressing, using the toilet or eating? Independent  Indoor Mobility: Did the patient need assistance with walking from room to room (with or without device)? Independent  Stairs: Did the patient need assistance with internal or external stairs (with or without device)? Independent  Functional Cognition: Did the patient need help planning regular tasks such as shopping or remembering to take medications? Independent  Current Functional Level Cognition  Arousal/Alertness: Awake/alert Overall Cognitive Status: Impaired/Different from baseline Current Attention Level: Sustained Orientation Level: Oriented to person, Oriented to time, Oriented to place, Disoriented to situation Following Commands: Follows one step commands inconsistently Safety/Judgement: Decreased awareness  of deficits, Decreased awareness of safety General Comments: Pt continues to display impulsivity and no awareness of deficits. [place] she manages her own finances, cooks, drives, and watches her grandchildren (8,9, and 2). Continues to demonstrate expressive difficulties. Attention: Selective Selective Attention: Impaired Selective Attention Impairment: Verbal basic Memory: Impaired Memory Impairment: Retrieval deficit, Decreased recall of new information Awareness: Appears intact    Extremity Assessment (includes Sensation/Coordination)  Upper Extremity Assessment: Overall WFL for tasks assessed  Lower Extremity Assessment: Defer to PT evaluation    ADLs  Overall ADL's : Needs assistance/impaired Eating/Feeding: Set up Eating/Feeding Details (indicate cue type and reason): Pt had daughter make coffee for her, reported that she cut up her own breakfast and had no problems eating, but daughter shared she had trouble sequencing to open up her utensils Grooming: Oral care, Wash/dry hands, Wash/dry face, Moderate assistance, Cueing for sequencing, Standing Grooming Details (indicate cue type and reason): sink level; tried to brush teeth without toothpaste able to perform bilateral fine motor task of opening the tube of toothpaste, but started brushing her teeth and had no awareness when toothpaste did not come out. She needed mod verbal cues for succesful task completion. Pt could not figure out how to run hot water, and simply stated "this water is so cold!" Upper Body Bathing: Moderate assistance, Cueing for sequencing Lower Body Bathing: Cueing for sequencing, Moderate assistance Upper Body Dressing : Minimal assistance, Cueing for sequencing Lower Body Dressing: Minimal assistance, Cueing for sequencing Lower Body Dressing Details (indicate cue type and reason): Pt able to reach feet to don/doff socks in sitting - min A for safety and vc for sequencing Toilet Transfer: Minimal  assistance, Cueing for safety, Ambulation (BSC over toilet) Toilet Transfer Details (indicate cue type and reason): impulsive, and unaware of IV in arm despite therapist cues. Able to follow one step cues inconsistently. Pt physically can perform task but needs cues for safety and sequencing. Toileting- Water quality scientist and Hygiene: Minimal assistance, Sit to/from stand Scientist, research (medical):  Walk-in shower, Minimal assistance Functional mobility during ADLs: Minimal assistance, Cueing for safety General ADL Comments: Pt with no awareness of deficits, and benefits from short explanations of what is going to happen before the task especially if belts or equipment are going to be used.     Mobility  Overal bed mobility: Independent General bed mobility comments: In chair upon entry     Transfers  Overall transfer level: Needs assistance Equipment used: None Transfers: Sit to/from Stand Sit to Stand: Min assist General transfer comment: Pt required verbal cues to wait for therapist before transferring. Verbal cues throughout to wait for therapist to get IV pole and other equipment ready. Required verbal cues for importance of use of gait belt during functional mobility.     Ambulation / Gait / Stairs / Wheelchair Mobility  Ambulation/Gait Ambulation/Gait assistance: Min guard, Min assist Ambulation Distance (Feet): 50 Feet Assistive device: None Gait Pattern/deviations: Step-through pattern, Decreased stride length, Drifts right/left General Gait Details: 63' X 3 this session. Multiple cues for appropriate gait speed to increase safety. Incorporated cognitive task with mobility. Pt unable to remember room number, so multiple cues required to find correct room. Pt with impulsivity during gait and required multiple cues to slow gait speed and stop for bed to pass by in hallway. Performed horizontal and vertical head turns during mobility. Demonstrated decreased balance with both  requiring min A to prevent LOB.  Gait velocity interpretation: >2.62 ft/sec, indicative of independent community ambulator Stairs: Yes Stairs assistance: Min guard Stair Management: One rail Left, Alternating pattern Number of Stairs: 11 General stair comments: pt briskly ascended and descended stairs before any instruction given.    Posture / Balance Balance Overall balance assessment: Needs assistance Sitting-balance support: No upper extremity supported, Feet supported Sitting balance-Leahy Scale: Good Standing balance support: No upper extremity supported, During functional activity Standing balance-Leahy Scale: Fair Standing balance comment: Decreased balance without use of AD. Pt refuses AD.  Standardized Balance Assessment Standardized Balance Assessment : Dynamic Gait Index Dynamic Gait Index Level Surface: Normal Change in Gait Speed: Normal Gait and Pivot Turn: Normal Step Over Obstacle: Mild Impairment Step Around Obstacles: Normal Steps: Mild Impairment    Special needs/care consideration BiPAP/CPAP: No CPM: No Continuous Drip IV: 0.9% NaCl with KCI 20 mEq/L infusion: 75 mL/hr Dialysis: No         Life Vest: No Oxygen: No Special Bed: No Trach Size: No Wound Vac (area): No       Skin: Incision left scalp; Patient with bilateral upper extremity restricted access due to PICC in left arm and 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  Bowel mgmt: Continent 10/30/16 Bladder mgmt: Continent  Diabetic mgmt: N/A     Previous Home Environment Living Arrangements: Alone  Lives With: Alone Available Help at Discharge: Family, Friend(s), Available 24 hours/day Type of Home: House Home Layout: Two level, Able to live on main level with bedroom/bathroom Alternate Level Stairs-Number of Steps: flight Home Access: Other (comment) (threshold) Entrance Stairs-Rails: None Entrance Stairs-Number of Steps: 1 Bathroom Shower/Tub: Tourist information centre manager: Standard Bathroom Accessibility: Yes How Accessible: Accessible via walker Home Care Services: No Additional Comments: Pt sister reporting pt's children should be able to stay with pt 24/7.   Discharge Living Setting Plans for Discharge Living Setting: Patient's home Type of Home at Discharge: House Discharge Home Layout: Two level, Able to live on main level with bedroom/bathroom Alternate Level Stairs-Rails: Left Alternate Level Stairs-Number of Steps: 1 flight  Discharge Home Access: Stairs to enter Entrance Stairs-Rails: None Entrance Stairs-Number of Steps: 1 Discharge Bathroom Shower/Tub: Walk-in shower (has a bench ) Discharge Bathroom Toilet: Standard Discharge Bathroom Accessibility: Yes How Accessible: Accessible via walker Does the patient have any problems obtaining your medications?: No  Social/Family/Support Systems Patient Roles: Parent, Other (Comment) (grandparent, sister) Contact Information: Daughter Okey Dupre Anticipated Caregiver: Patient with Mod I goals but daughter is local and will assist some Anticipated Caregiver's Contact Information: [phone] Ability/Limitations of Caregiver: Daughter works and has a family  Caregiver Availability: Intermittent Discharge Plan Discussed with Primary Caregiver: Yes Is Caregiver In Agreement with Plan?: Yes Does Caregiver/Family have Issues with Lodging/Transportation while Pt is in Rehab?: No  Goals/Additional Needs Patient/Family Goal for Rehab: PT/OT/SLP Mod I  Expected length of stay: about 7 days  Cultural Considerations: None Dietary Needs: Regualr texture and thin liquids  Equipment Needs: TBD Special Service Needs: None Additional Information: See below Pt/Family Agrees to Admission and willing to participate: Yes Program Orientation Provided & Reviewed with Pt/Caregiver Including Roles  & Responsibilities: Yes Additional Information Needs: Patient with bilateral upper extremity restricted  access due to PICC in left arm and 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 Will admit to inpatient rehab today.  Preadmission Screen Completed By:  Gunnar Alexandria [name], 10/30/2016 1:48 PM ______________________________________________________________________   Discussed status with Dr. Letta Pate on 10/30/16 at  1355 and received telephone approval for admission today.  Admission Coordinator:  Gunnar Alexandria [name], time 1355/Date 10/30/16       Cosigned by: Charlett Blake, MD at 10/30/2016 3:07 PM  Revision History

## 2016-10-30 NOTE — Progress Notes (Signed)
Inpatient Rehabilitation  I met with patient, daughter, and sister to discuss team's recommendation for IP Rehab.  Shared booklets and answered questions.  Patient and family are eager for her to regain her independence and are in favor of IP Rehab.  I await insurance decision prior to hopeful admission today.  Plan to follow up with team as I know.  Please call with questions.   Carmelia Roller., CCC/SLP Admission Coordinator  [place]  Cell [phone]

## 2016-10-30 NOTE — Progress Notes (Signed)
qPhysical Therapy Treatment Patient Details Name: Alexandria [name] MRN: [id number] DOB: 1945-11-09 Today's Date: 10/30/2016    History of Present Illness Pt is a 71 y.o. female presented to ED with AMS and acute encephalopathy.  MRI revealed L frontal mass.  S/p Craniotomy with mass excision 10/27/16. PMH includes CA 1996, HTN.  Past prodecures include R mastectomy and hysterectomy.    PT Comments    Pt progressing towards goals. Pt continuing to exhibit decreased safety awareness during functional gait tasks and require cues to wait for PT for mobility. Pt demonstrating decreased balance with head turns and other dual tasks during mobility requiring min A to prevent LOB. Pt demonstrating difficulty sequencing with toileting and mobility tasks this session. Continue to recommend CIR at d/c to increase independence and safety with mobility. Will continue to follow.   Follow Up Recommendations  CIR;Supervision/Assistance - 24 hour     Equipment Recommendations  3in1 (PT)    Recommendations for Other Services Rehab consult     Precautions / Restrictions Precautions Precautions: Fall Restrictions Weight Bearing Restrictions: No    Mobility  Bed Mobility               General bed mobility comments: In chair upon entry   Transfers Overall transfer level: Needs assistance Equipment used: None Transfers: Sit to/from Stand Sit to Stand: Min assist         General transfer comment: Pt required verbal cues to wait for therapist before transferring. Verbal cues throughout to wait for therapist to get IV pole and other equipment ready. Required verbal cues for importance of use of gait belt during functional mobility.   Ambulation/Gait Ambulation/Gait assistance: Min guard;Min assist Ambulation Distance (Feet): 50 Feet Assistive device: None Gait Pattern/deviations: Step-through pattern;Decreased stride length;Drifts right/left   Gait velocity interpretation: >2.62  ft/sec, indicative of independent community ambulator General Gait Details: 44' X 3 this session. Multiple cues for appropriate gait speed to increase safety. Incorporated cognitive task with mobility. Pt unable to remember room number, so multiple cues required to find correct room. Pt with impulsivity during gait and required multiple cues to slow gait speed and stop for bed to pass by in hallway. Performed horizontal and vertical head turns during mobility. Demonstrated decreased balance with both requiring min A to prevent LOB.    Stairs            Wheelchair Mobility    Modified Rankin (Stroke Patients Only)       Balance Overall balance assessment: Needs assistance Sitting-balance support: No upper extremity supported;Feet supported Sitting balance-Leahy Scale: Good     Standing balance support: No upper extremity supported;During functional activity Standing balance-Leahy Scale: Fair Standing balance comment: Decreased balance without use of AD. Pt refuses AD.                             Cognition Arousal/Alertness: Awake/alert Behavior During Therapy: Impulsive Overall Cognitive Status: Impaired/Different from baseline Area of Impairment: Safety/judgement;Following commands;Attention;Problem solving;Memory                   Current Attention Level: Sustained Memory: Decreased short-term memory;Decreased recall of precautions Following Commands: Follows one step commands inconsistently Safety/Judgement: Decreased awareness of deficits;Decreased awareness of safety   Problem Solving: Slow processing;Difficulty sequencing;Requires verbal cues;Requires tactile cues General Comments: Pt continues to display impulsivity and no awareness of deficits. [place] she manages her own finances, cooks, drives, and watches her grandchildren (  8,9, and 2). Continues to demonstrate expressive difficulties.      Exercises      General Comments General  comments (skin integrity, edema, etc.): Pt's daughter in room throughout session. Discussed CIR recommendations and daughter and pt agreeable. Pt required verbal cues for sequencing during toileting tasks secondary to decreased cognition. RN cleared for PT to perform session and pt not symptomatic.       Pertinent Vitals/Pain Pain Assessment: No/denies pain    Home Living Family/patient expects to be discharged to:: Private residence Living Arrangements: Alone Available Help at Discharge: Family;Friend(s);Available 24 hours/day Type of Home: House Home Access: Other (comment) (threshold) Entrance Stairs-Rails: None Home Layout: Two level;Able to live on main level with bedroom/bathroom Home Equipment: Shower seat - built in      Prior Function Level of Independence: Independent          PT Goals (current goals can now be found in the care plan section) Acute Rehab PT Goals Patient Stated Goal: to go to rehab and then go home to be independent PT Goal Formulation: With patient/family Time For Goal Achievement: 11/11/16 Potential to Achieve Goals: Good Progress towards PT goals: Progressing toward goals    Frequency    Min 3X/week      PT Plan Current plan remains appropriate    Co-evaluation             End of Session Equipment Utilized During Treatment: Gait belt Activity Tolerance: Patient tolerated treatment well Patient left: in chair;with call bell/phone within reach;with family/visitor present Nurse Communication: Mobility status PT Visit Diagnosis: Other abnormalities of gait and mobility (R26.89);Other symptoms and signs involving the nervous system (R29.898)     Time: 1100-1117 PT Time Calculation (min) (ACUTE ONLY): 17 min  Charges:  $Gait Training: 8-22 mins                    G Codes:       [name], PT, DPT  Acute Rehabilitation Services  Pager: [phone]  [name] 10/30/2016, 11:56 AM

## 2016-10-30 NOTE — Care Management Note (Signed)
Case Management Note  Patient Details  Name: Alexandria [name] MRN: [id number] Date of Birth: [birth year]  Subjective/Objective:                    Action/Plan: Patient discharging to CIR today. No further needs per CM.   Expected Discharge Date:  10/30/16               Expected Discharge Plan:  [place]  In-House Referral:     Discharge planning Services  CM Consult  Post Acute Care Choice:    Choice offered to:     DME Arranged:    DME Agency:     HH Arranged:    HH Agency:     Status of Service:  Completed, signed off  If discussed at [employer], dates discussed:    Additional Comments:  Pollie Friar, RN 10/30/2016, 2:38 PM

## 2016-10-30 NOTE — Evaluation (Signed)
Occupational Therapy Evaluation Patient Details Name: Alexandria DOLEMAN MRN: [id number] DOB: 07-29-1946 Today's Date: 10/30/2016    History of Present Illness Pt is a 71 y.o. female presented to ED with AMS and acute encephalopathy.  MRI revealed L frontal mass.  S/p Craniotomy with mass excision 10/27/16. PMH includes CA 1996, HTN.  Past prodecures include R mastectomy and hysterectomy.   Clinical Impression   PTA Pt independent in ADL/IADL caregiver for grandkids, driving, loves reading; independent in mobility as well. Pt currently mod A for ADL and min A for mobility. Pt has NO awareness of deficits and displays impulsivity during session. She was able to write a sentence about her daughter that was legible and complete. She was unable to draw a clock. She drew it very small. When she labeled it, she drew the 12, 3, 6, and 9 first and in the correct places. Then she proceeded to fill it in numerically so that If read starting at 12 it says "12, 1, 2, 3, 4, 6, 5, 6, 7, 9, 10, 11, 12" when asked if it was correct she looked at it again and said "yes". She needed mod assist to problem solve during multiple occasions (please see ADL section below) and was unable to copy the dual pentagon drawing from the MMSE (just this one question completed from the MMSE). When asked to remember 3 objects "balloon, tiger, brush" she was unable - when we provided a cognitive strategy and memory associated with each object she was able to recall 5 and 10 min later. Pt will benefit from skilled OT in the acute setting and Pt will require CIR level therapy to maximize independence in ADL and SAFETY as she works towards [name].    Follow Up Recommendations  CIR;Supervision/Assistance - 24 hour    Equipment Recommendations  Other (comment) (defer to next venue)    Recommendations for Other Services Speech consult     Precautions / Restrictions Precautions Precautions: Fall Restrictions Weight Bearing  Restrictions: No      Mobility Bed Mobility               General bed mobility comments: In chair upon entry   Transfers Overall transfer level: Needs assistance Equipment used: None Transfers: Sit to/from Stand Sit to Stand: Min assist         General transfer comment: Min assist due to vc for safety. Pt demonstrating impulsivity with transfers and required multiple safety cues from OT to wait to perform transfers due to IV lines or setting up the next evaluation task.    Balance Overall balance assessment: Needs assistance Sitting-balance support: No upper extremity supported;Feet supported Sitting balance-Leahy Scale: Good     Standing balance support: No upper extremity supported;During functional activity Standing balance-Leahy Scale: Fair Standing balance comment: leans against sink during grooming for balance                           ADL either performed or assessed with clinical judgement   ADL Overall ADL's : Needs assistance/impaired Eating/Feeding: Set up Eating/Feeding Details (indicate cue type and reason): Pt had daughter make coffee for her, reported that she cut up her own breakfast and had no problems eating, but daughter shared she had trouble sequencing to open up her utensils Grooming: Oral care;Wash/dry hands;Wash/dry face;Moderate assistance;Cueing for sequencing;Standing Grooming Details (indicate cue type and reason): sink level; tried to brush teeth without toothpaste able to perform bilateral fine motor task  of opening the tube of toothpaste, but started brushing her teeth and had no awareness when toothpaste did not come out. She needed mod verbal cues for succesful task completion. Pt could not figure out how to run hot water, and simply stated "this water is so cold!" Upper Body Bathing: Moderate assistance;Cueing for sequencing   Lower Body Bathing: Cueing for sequencing;Moderate assistance   Upper Body Dressing : Minimal  assistance;Cueing for sequencing   Lower Body Dressing: Minimal assistance;Cueing for sequencing Lower Body Dressing Details (indicate cue type and reason): Pt able to reach feet to don/doff socks in sitting - min A for safety and vc for sequencing Toilet Transfer: Minimal assistance;Cueing for safety;Ambulation (BSC over toilet) Toilet Transfer Details (indicate cue type and reason): impulsive, and unaware of IV in arm despite therapist cues. Able to follow one step cues inconsistently. Pt physically can perform task but needs cues for safety and sequencing. Toileting- Clothing Manipulation and Hygiene: Minimal assistance;Sit to/from stand   Tub/ Banker: Walk-in shower;Minimal assistance   Functional mobility during ADLs: Minimal assistance;Cueing for safety General ADL Comments: Pt with no awareness of deficits, and benefits from short explanations of what is going to happen before the task especially if belts or equipment are going to be used.      Vision Baseline Vision/History: No visual deficits Patient Visual Report: No change from baseline Additional Comments: Pt able to read from menu, Pt able to find objects around room, Pt able to find objects scattered aroud sink for grooming     Perception     Praxis      Pertinent Vitals/Pain Pain Assessment: No/denies pain     Hand Dominance Right   Extremity/Trunk Assessment Upper Extremity Assessment Upper Extremity Assessment: Overall WFL for tasks assessed   Lower Extremity Assessment Lower Extremity Assessment: Defer to PT evaluation   Cervical / Trunk Assessment Cervical / Trunk Assessment: Normal   Communication Communication Communication: No difficulties   Cognition Arousal/Alertness: Awake/alert Behavior During Therapy: Impulsive Overall Cognitive Status: Impaired/Different from baseline Area of Impairment: Safety/judgement;Following commands;Attention;Problem solving;Memory                    Current Attention Level: Sustained Memory: Decreased short-term memory Following Commands: Follows one step commands inconsistently Safety/Judgement: Decreased awareness of safety;Decreased awareness of deficits   Problem Solving: Slow processing;Difficulty sequencing;Requires verbal cues;Requires tactile cues General Comments: Pt continues to display impulsivity and no awareness of deficits. [place] she manages her own finances, cooks, drives, and watches her grandchildren (8,9, and 2).    General Comments  Pt's daughter in room for entire session. NO blood pressure taken this session, permission from RN prior to session and Pt non-symptomatic during session.    Exercises     Shoulder Instructions      Home Living Family/patient expects to be discharged to:: Private residence Living Arrangements: Alone Available Help at Discharge: Family;Friend(s);Available 24 hours/day Type of Home: House Home Access: Other (comment) (threshold) Entrance Stairs-Number of Steps: 1 Entrance Stairs-Rails: None Home Layout: Two level;Able to live on main level with bedroom/bathroom Alternate Level Stairs-Number of Steps: flight   Bathroom Shower/Tub: Occupational psychologist: Standard Bathroom Accessibility: Yes How Accessible: Accessible via walker Home Equipment: Shower seat - built in      Lives With: Alone    Prior Functioning/Environment Level of Independence: Independent                 OT Problem List: Impaired balance (sitting  and/or standing);Decreased cognition;Decreased safety awareness      OT Treatment/Interventions: Self-care/ADL training;DME and/or AE instruction;Cognitive remediation/compensation;Therapeutic activities;Patient/family education;Balance training    OT Goals(Current goals can be found in the care plan section) Acute Rehab OT Goals Patient Stated Goal: to go to rehab and then go home to be independent OT Goal Formulation: With  patient/family Time For Goal Achievement: 11/13/16 Potential to Achieve Goals: Good ADL Goals Pt Will Perform Grooming: standing;with supervision (sink level with 2 or less verbal cues) Pt Will Perform Upper Body Bathing: with modified independence;standing (with one or less vc ) Pt Will Perform Lower Body Bathing: with supervision;sit to/from stand (with one or less verbal cues) Additional ADL Goal #1: Pt will perform 3 step activity in order at supervision level with less than 2 verbal cues.  OT Frequency: Min 3X/week   Barriers to D/C: Other (comment) (Pt lives alone - but has support from family upon dc)          Co-evaluation              End of Session Equipment Utilized During Treatment: Gait belt Nurse Communication: Mobility status  Activity Tolerance: Patient tolerated treatment well Patient left: in chair;with call bell/phone within reach  OT Visit Diagnosis: Unsteadiness on feet (R26.81);Other symptoms and signs involving cognitive function                Time: 9201-0071 OT Time Calculation (min): 29 min Charges:  OT General Charges $OT Visit: 1 Procedure OT Evaluation $OT Eval Moderate Complexity: 1 Procedure OT Treatments $Self Care/Home Management : 8-22 mins G-Codes:     Hulda Humphrey OTR/L [place] 10/30/2016, 9:46 AM

## 2016-10-30 NOTE — PMR Pre-admission (Signed)
PMR Admission Coordinator Pre-Admission Assessment  Patient: Alexandria [name] is an 71 y.o., female MRN: [id number] DOB: 1945-09-30 Height: 5\' 5"  (165.1 cm) Weight: 74.9 kg (165 lb 2 oz)              Insurance Information HMO:     PPO: X     PCP:      IPA:      80/20:      OTHER:  PRIMARY: U[employer] Medicare       Policy#: [id number]      Subscriber: Self CM Name: Sherlynn Stalls       Phone#: [phone]     Fax#: [phone] Pre-Cert#: [id number], authorization given by Orvan July      Employer:  Benefits:  Phone #: verified online     Name:  Eff. Date: 08/04/16     Deduct: $0      Out of Pocket Max: $4,500      Life Max: N/A CIR: $345 a day, days 1-5; $0 a day, days 50-100      SNF: $0 a day, days 1-20; $160 a day, days 21-49; $0 a day, days 50-100 Outpatient: PT/OT/SLP     Co-Pay: $40 per visit  Home Health: PT/OT/SLP      Co-Pay: $0 DME: 80%     Co-Pay: 20% Providers: in network   Medicaid Application Date:       Case Manager:  Disability Application Date:       Case Worker:   Emergency Contact Information Contact Information    Name Relation Home Work Berry Daughter [phone]       Current Medical History  Patient Admitting Diagnosis: 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 Per chart review patient lives alone and was independent prior to admission. 2 level home with bedroom on main level. Family in area to assist. Presented 10/22/2016 with altered mental status that progressed over the past few days. CT imaging demonstrated a mass like lesion in the left frontal lobe with surrounding vasogenic edema and localized mass effect question malignancy versus abscess. CT of abdomen and pelvis as well as CT of chest negative for acute findings. Neurosurgery consulted and underwent left  craniotomy tumor excision 10/27/2016 per Dr. Vertell Limber. Infectious disease consulted with blood cultures showing Streptococcus intermediusand presently on Intravenous ceftriaxone and oral metronidazole anticipate 6 weeks of antibiotic care. Decadron protocol as directed.Keppra added for seizure prophylaxis.Tolerating a regular diet. Physical and occupational therapy evaluation completed with recommendations of physical medicine rehabilitation consult. Patient was admitted for a comprehensive rehabilitation program.      Past Medical History  Past Medical History:  Diagnosis Date  . Cancer [facility]) 1996   breast cx  . Hypertension     Family History  family history includes Breast cancer in her mother; Colon cancer in her father.  Prior Rehab/Hospitalizations:  Has the patient had major surgery during 100 days prior to admission? No  Current Medications   Current Facility-Administered Medications:  .  0.9 % NaCl with KCl 20 mEq/ L  infusion, , Intravenous, Continuous, Erline Levine, MD, Last Rate: 75 mL/hr at 10/30/16 0359 .  acetaminophen (TYLENOL) tablet 650 mg, 650 mg, Oral, Q4H PRN, 650 mg at 10/27/16 2151 **OR** acetaminophen (TYLENOL) suppository 650 mg, 650 mg, Rectal, Q4H PRN, Erline Levine, MD .  ALPRAZolam Duanne Moron) tablet 0.125-0.25  mg, 0.125-0.25 mg, Oral, QHS PRN, Karmen Bongo, MD, 0.25 mg at 10/26/16 2137 .  [START ON 10/31/2016] benazepril (LOTENSIN) tablet 40 mg, 40 mg, Oral, Daily **AND** [START ON 10/31/2016] hydrochlorothiazide (HYDRODIURIL) tablet 25 mg, 25 mg, Oral, Daily, Reyne Dumas, MD .  bisacodyl (DULCOLAX) suppository 10 mg, 10 mg, Rectal, Daily PRN, Erline Levine, MD .  cefTRIAXone (ROCEPHIN) 2 g in dextrose 5 % 50 mL IVPB, 2 g, Intravenous, Q12H, Michel Bickers, MD, 2 g at 10/30/16 0929 .  dexamethasone (DECADRON) tablet 4 mg, 4 mg, Oral, Q12H, Reyne Dumas, MD .  docusate sodium (COLACE) capsule 100 mg, 100 mg, Oral, BID, Erline Levine, MD, 100 mg at 10/29/16 2223 .   hydrALAZINE (APRESOLINE) injection 5 mg, 5 mg, Intravenous, Q4H PRN, Karmen Bongo, MD, 5 mg at 10/29/16 1636 .  HYDROcodone-acetaminophen (NORCO/VICODIN) 5-325 MG per tablet 1 tablet, 1 tablet, Oral, Q4H PRN, Erline Levine, MD, 1 tablet at 10/29/16 2335 .  insulin aspart (novoLOG) injection 0-9 Units, 0-9 Units, Subcutaneous, TID WC, Modena Jansky, MD, 1 Units at 10/30/16 1157 .  labetalol (NORMODYNE,TRANDATE) injection 10-40 mg, 10-40 mg, Intravenous, Q10 min PRN, Erline Levine, MD, 10 mg at 10/29/16 1902 .  levETIRAcetam (KEPPRA) tablet 500 mg, 500 mg, Oral, BID, Erline Levine, MD, 500 mg at 10/30/16 1610 .  metoprolol succinate (TOPROL-XL) 24 hr tablet 100 mg, 100 mg, Oral, QPM, Karmen Bongo, MD, 100 mg at 10/29/16 1659 .  metroNIDAZOLE (FLAGYL) tablet 500 mg, 500 mg, Oral, Q8H, Michel Bickers, MD, 500 mg at 10/30/16 0921 .  morphine 2 MG/ML injection 1-2 mg, 1-2 mg, Intravenous, Q2H PRN, Erline Levine, MD, 1 mg at 10/28/16 0759 .  ondansetron (ZOFRAN) tablet 4 mg, 4 mg, Oral, Q4H PRN **OR** ondansetron (ZOFRAN) injection 4 mg, 4 mg, Intravenous, Q4H PRN, Erline Levine, MD .  pantoprazole (PROTONIX) EC tablet 40 mg, 40 mg, Oral, Daily, Reyne Dumas, MD, 40 mg at 10/30/16 0921 .  polyethylene glycol (MIRALAX / GLYCOLAX) packet 17 g, 17 g, Oral, Daily PRN, Erline Levine, MD .  promethazine (PHENERGAN) tablet 12.5-25 mg, 12.5-25 mg, Oral, Q4H PRN, Erline Levine, MD .  saccharomyces boulardii (FLORASTOR) capsule 250 mg, 250 mg, Oral, BID, Reyne Dumas, MD, 250 mg at 10/30/16 0921 .  sodium chloride flush (NS) 0.9 % injection 10-40 mL, 10-40 mL, Intracatheter, PRN, Reyne Dumas, MD, 10 mL at 10/29/16 0440 .  sodium phosphate (FLEET) 7-19 GM/118ML enema 1 enema, 1 enema, Rectal, Once PRN, Erline Levine, MD  Patients Current Diet: Diet regular Room service appropriate? Yes; Fluid consistency: Thin Diet - low sodium heart healthy  Precautions / Restrictions Precautions Precautions:  Fall Restrictions Weight Bearing Restrictions: No   Has the patient had 2 or more falls or a fall with injury in the past year?No  Prior Activity Level Community (5-7x/wk): Prior to admission patient independent with all ADLs and IADLs.  SHe was very active caring for her grandchildren, cooking, cleaning, and doing yoga.    Home Assistive Devices / Equipment Home Assistive Devices/Equipment: Eyeglasses Home Equipment: Shower seat - built in  Prior Device Use: Indicate devices/aids used by the patient prior to current illness, exacerbation or injury? None of the above  Prior Functional Level Prior Function Level of Independence: Independent  Self Care: Did the patient need help bathing, dressing, using the toilet or eating? Independent  Indoor Mobility: Did the patient need assistance with walking from room to room (with or without device)? Independent  Stairs: Did the patient need assistance with  internal or external stairs (with or without device)? Independent  Functional Cognition: Did the patient need help planning regular tasks such as shopping or remembering to take medications? Independent  Current Functional Level Cognition  Arousal/Alertness: Awake/alert Overall Cognitive Status: Impaired/Different from baseline Current Attention Level: Sustained Orientation Level: Oriented to person, Oriented to time, Oriented to place, Disoriented to situation Following Commands: Follows one step commands inconsistently Safety/Judgement: Decreased awareness of deficits, Decreased awareness of safety General Comments: Pt continues to display impulsivity and no awareness of deficits. [place] she manages her own finances, cooks, drives, and watches her grandchildren (8,9, and 2). Continues to demonstrate expressive difficulties. Attention: Selective Selective Attention: Impaired Selective Attention Impairment: Verbal basic Memory: Impaired Memory Impairment: Retrieval deficit,  Decreased recall of new information Awareness: Appears intact    Extremity Assessment (includes Sensation/Coordination)  Upper Extremity Assessment: Overall WFL for tasks assessed  Lower Extremity Assessment: Defer to PT evaluation    ADLs  Overall ADL's : Needs assistance/impaired Eating/Feeding: Set up Eating/Feeding Details (indicate cue type and reason): Pt had daughter make coffee for her, reported that she cut up her own breakfast and had no problems eating, but daughter shared she had trouble sequencing to open up her utensils Grooming: Oral care, Wash/dry hands, Wash/dry face, Moderate assistance, Cueing for sequencing, Standing Grooming Details (indicate cue type and reason): sink level; tried to brush teeth without toothpaste able to perform bilateral fine motor task of opening the tube of toothpaste, but started brushing her teeth and had no awareness when toothpaste did not come out. She needed mod verbal cues for succesful task completion. Pt could not figure out how to run hot water, and simply stated "this water is so cold!" Upper Body Bathing: Moderate assistance, Cueing for sequencing Lower Body Bathing: Cueing for sequencing, Moderate assistance Upper Body Dressing : Minimal assistance, Cueing for sequencing Lower Body Dressing: Minimal assistance, Cueing for sequencing Lower Body Dressing Details (indicate cue type and reason): Pt able to reach feet to don/doff socks in sitting - min A for safety and vc for sequencing Toilet Transfer: Minimal assistance, Cueing for safety, Ambulation (BSC over toilet) Toilet Transfer Details (indicate cue type and reason): impulsive, and unaware of IV in arm despite therapist cues. Able to follow one step cues inconsistently. Pt physically can perform task but needs cues for safety and sequencing. Toileting- Clothing Manipulation and Hygiene: Minimal assistance, Sit to/from stand Scientist, research (medical): Walk-in shower, Minimal  assistance Functional mobility during ADLs: Minimal assistance, Cueing for safety General ADL Comments: Pt with no awareness of deficits, and benefits from short explanations of what is going to happen before the task especially if belts or equipment are going to be used.     Mobility  Overal bed mobility: Independent General bed mobility comments: In chair upon entry     Transfers  Overall transfer level: Needs assistance Equipment used: None Transfers: Sit to/from Stand Sit to Stand: Min assist General transfer comment: Pt required verbal cues to wait for therapist before transferring. Verbal cues throughout to wait for therapist to get IV pole and other equipment ready. Required verbal cues for importance of use of gait belt during functional mobility.     Ambulation / Gait / Stairs / Wheelchair Mobility  Ambulation/Gait Ambulation/Gait assistance: Min guard, Min assist Ambulation Distance (Feet): 50 Feet Assistive device: None Gait Pattern/deviations: Step-through pattern, Decreased stride length, Drifts right/left General Gait Details: 3' X 3 this session. Multiple cues for appropriate gait speed to increase safety. Incorporated  cognitive task with mobility. Pt unable to remember room number, so multiple cues required to find correct room. Pt with impulsivity during gait and required multiple cues to slow gait speed and stop for bed to pass by in hallway. Performed horizontal and vertical head turns during mobility. Demonstrated decreased balance with both requiring min A to prevent LOB.  Gait velocity interpretation: >2.62 ft/sec, indicative of independent community ambulator Stairs: Yes Stairs assistance: Min guard Stair Management: One rail Left, Alternating pattern Number of Stairs: 11 General stair comments: pt briskly ascended and descended stairs before any instruction given.    Posture / Balance Balance Overall balance assessment: Needs assistance Sitting-balance support:  No upper extremity supported, Feet supported Sitting balance-Leahy Scale: Good Standing balance support: No upper extremity supported, During functional activity Standing balance-Leahy Scale: Fair Standing balance comment: Decreased balance without use of AD. Pt refuses AD.  Standardized Balance Assessment Standardized Balance Assessment : Dynamic Gait Index Dynamic Gait Index Level Surface: Normal Change in Gait Speed: Normal Gait and Pivot Turn: Normal Step Over Obstacle: Mild Impairment Step Around Obstacles: Normal Steps: Mild Impairment    Special needs/care consideration BiPAP/CPAP: No CPM: No Continuous Drip IV: 0.9% NaCl with KCI 20 mEq/L infusion: 75 mL/hr Dialysis: No         Life Vest: No Oxygen: No Special Bed: No Trach Size: No Wound Vac (area): No       Skin: Incision left scalp; Patient with bilateral upper extremity restricted access due to PICC in left arm and 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  Bowel mgmt: Continent 10/30/16 Bladder mgmt: Continent  Diabetic mgmt: N/A     Previous Home Environment Living Arrangements: Alone  Lives With: Alone Available Help at Discharge: Family, Friend(s), Available 24 hours/day Type of Home: House Home Layout: Two level, Able to live on main level with bedroom/bathroom Alternate Level Stairs-Number of Steps: flight Home Access: Other (comment) (threshold) Entrance Stairs-Rails: None Entrance Stairs-Number of Steps: 1 Bathroom Shower/Tub: Multimedia programmer: Standard Bathroom Accessibility: Yes How Accessible: Accessible via walker Home Care Services: No Additional Comments: Pt sister reporting pt's children should be able to stay with pt 24/7.   Discharge Living Setting Plans for Discharge Living Setting: Patient's home Type of Home at Discharge: House Discharge Home Layout: Two level, Able to live on main level with bedroom/bathroom Alternate Level Stairs-Rails: Left Alternate  Level Stairs-Number of Steps: 1 flight  Discharge Home Access: Stairs to enter Entrance Stairs-Rails: None Entrance Stairs-Number of Steps: 1 Discharge Bathroom Shower/Tub: Walk-in shower (has a bench ) Discharge Bathroom Toilet: Standard Discharge Bathroom Accessibility: Yes How Accessible: Accessible via walker Does the patient have any problems obtaining your medications?: No  Social/Family/Support Systems Patient Roles: Parent, Other (Comment) (grandparent, sister) Contact Information: Daughter Okey Dupre Anticipated Caregiver: Patient with Mod I goals but daughter is local and will assist some Anticipated Caregiver's Contact Information: [phone] Ability/Limitations of Caregiver: Daughter works and has a family  Caregiver Availability: Intermittent Discharge Plan Discussed with Primary Caregiver: Yes Is Caregiver In Agreement with Plan?: Yes Does Caregiver/Family have Issues with Lodging/Transportation while Pt is in Rehab?: No  Goals/Additional Needs Patient/Family Goal for Rehab: PT/OT/SLP Mod I  Expected length of stay: about 7 days  Cultural Considerations: None Dietary Needs: Regualr texture and thin liquids  Equipment Needs: TBD Special Service Needs: None Additional Information: See below Pt/Family Agrees to Admission and willing to participate: Yes Program Orientation Provided & Reviewed with Pt/Caregiver Including Roles  & Responsibilities:  Yes Additional Information Needs: Patient with bilateral upper extremity restricted access due to PICC in left arm and 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 Will admit to inpatient rehab today.  Preadmission Screen Completed By:  Gunnar Fusi, 10/30/2016 1:48 PM ______________________________________________________________________   Discussed status with Dr. Letta Pate on 10/30/16 at  1355 and received telephone approval for admission today.  Admission Coordinator:  Gunnar Fusi, time 1355/Date 10/30/16

## 2016-10-30 NOTE — Progress Notes (Signed)
Subjective: Patient reports "I'm doing good"  Objective: Vital signs in last 24 hours: Temp:  [97.8 F (36.6 C)-98.4 F (36.9 C)] 98.2 F (36.8 C) (03/29 0900) Pulse Rate:  [76-92] 82 (03/29 0900) Resp:  [18-20] 18 (03/29 0900) BP: (164-189)/(81-129) 164/99 (03/29 0900) SpO2:  [93 %-98 %] 97 % (03/29 0900)  Intake/Output from previous day: 03/28 0701 - 03/29 0700 In: 510 [P.O.:360; IV Piggyback:150] Out: 2 [Urine:1; Stool:1] Intake/Output this shift: Total I/O In: 240 [P.O.:240] Out: -   Alert, smiling, responsive to questions, using sentences. PEARL. MAEW. Family present. Incision looks good. No erythema, swelling, or drainage. Awaiting insurance approval for CIR.   Lab Results:  Recent Labs  10/28/16 0554  WBC 9.2  HGB 12.8  HCT 38.9  PLT 188   BMET  Recent Labs  10/28/16 0554 10/29/16 0430  NA 137 139  K 3.6 3.5  CL 105 106  CO2 21* 25  GLUCOSE 139* 138*  BUN 14 16  CREATININE 0.72 0.74  CALCIUM 8.3* 8.4*    Studies/Results: No results found.  Assessment/Plan: Improving  LOS: 8 days  Hopeful of CIR. Office f/u in 2-3 weeks for staple removal.    Alexandria [name], Alexandria [name] 10/30/2016, 1:39 PM

## 2016-10-30 NOTE — Progress Notes (Signed)
Patient ID: Alexandria [name], female   DOB: 1946/01/27, 71 y.o.   MRN: [id number]         [facility] for Infectious Disease  Date of Admission:  10/22/2016           Day 4 ceftriaxone        Day 4 metronidazole  Principal Problem:   Brain abscess Active Problems:   Acute encephalopathy   Hx of breast cancer   Essential hypertension   Hypokalemia   Hyperglycemia   Brain tumor ([place])   . [START ON 10/31/2016] benazepril  40 mg Oral Daily   And  . [START ON 10/31/2016] hydrochlorothiazide  25 mg Oral Daily  . cefTRIAXone (ROCEPHIN)  IV  2 g Intravenous Q12H  . dexamethasone  4 mg Oral Q12H  . docusate sodium  100 mg Oral BID  . insulin aspart  0-9 Units Subcutaneous TID WC  . levETIRAcetam  500 mg Oral BID  . metoprolol succinate  100 mg Oral QPM  . metroNIDAZOLE  500 mg Oral Q8H  . pantoprazole  40 mg Oral Daily  . saccharomyces boulardii  250 mg Oral BID    SUBJECTIVE: She is feeling better today. She walked in the hallway with the physical therapist.  Review of Systems: Review of Systems  Constitutional: Negative for chills, diaphoresis and fever.  Gastrointestinal: Negative for abdominal pain, diarrhea, nausea and vomiting.  Neurological: Negative for headaches.    Past Medical History:  Diagnosis Date  . Cancer [facility]) 1996   breast cx  . Hypertension     Social History  Substance Use Topics  . Smoking status: Never Smoker  . Smokeless tobacco: Never Used  . Alcohol use Yes     Comment: occasional    Family History  Problem Relation Age of Onset  . Breast cancer Mother   . Colon cancer Father    Allergies  Allergen Reactions  . Sulfa Antibiotics     OBJECTIVE: Vitals:   10/30/16 0057 10/30/16 0538 10/30/16 0900 10/30/16 1345  BP: (!) 164/90 (!) 168/81 (!) 164/99 (!) 161/79  Pulse: 77 76 82 (!) 57  Resp: '18 20 18 16  ' Temp: 98.3 F (36.8 C) 98.3 F (36.8 C) 98.2 F (36.8 C) 97.9 F (36.6 C)  TempSrc: Oral Oral Oral Oral  SpO2: 93%  94% 97% 97%  Weight:      Height:       Body mass index is 27.48 kg/m.  Physical Exam  Constitutional:  She is sitting up in a chair smiling. She is visiting with family.  Cardiovascular: Normal rate and regular rhythm.   No murmur heard. Pulmonary/Chest: Effort normal and breath sounds normal.  Abdominal: Soft. There is no tenderness.  Neurological: She is alert.  Her speech continues to improve and is much more fluent and spontaneous today.  Skin: No rash noted.   Left arm PICC.  Psychiatric: Mood and affect normal.    Lab Results Lab Results  Component Value Date   WBC 9.2 10/28/2016   HGB 12.8 10/28/2016   HCT 38.9 10/28/2016   MCV 88.0 10/28/2016   PLT 188 10/28/2016    Lab Results  Component Value Date   CREATININE 0.74 10/29/2016   BUN 16 10/29/2016   NA 139 10/29/2016   K 3.5 10/29/2016   CL 106 10/29/2016   CO2 25 10/29/2016    Lab Results  Component Value Date   ALT 31 10/29/2016   AST 20 10/29/2016   ALKPHOS  50 10/29/2016   BILITOT 0.5 10/29/2016     Microbiology: Recent Results (from the past 240 hour(s))  Culture, blood (x 2)     Status: None   Collection Time: 10/23/16  2:55 AM  Result Value Ref Range Status   Specimen Description BLOOD LEFT ANTECUBITAL  Final   Special Requests BOTTLES DRAWN AEROBIC AND ANAEROBIC 10CC EACH  Final   Culture NO GROWTH 5 DAYS  Final   Report Status 10/28/2016 FINAL  Final  Culture, blood (x 2)     Status: None   Collection Time: 10/23/16  3:04 AM  Result Value Ref Range Status   Specimen Description BLOOD LEFT HAND  Final   Special Requests BOTTLES DRAWN AEROBIC AND ANAEROBIC 10CC EACH  Final   Culture NO GROWTH 5 DAYS  Final   Report Status 10/28/2016 FINAL  Final  Culture, Urine     Status: Abnormal   Collection Time: 10/24/16  8:25 AM  Result Value Ref Range Status   Specimen Description URINE, RANDOM  Final   Special Requests NONE  Final   Culture MULTIPLE SPECIES PRESENT, SUGGEST RECOLLECTION (A)   Final   Report Status 10/25/2016 FINAL  Final  MRSA PCR Screening     Status: None   Collection Time: 10/25/16  3:29 PM  Result Value Ref Range Status   MRSA by PCR NEGATIVE NEGATIVE Final    Comment:        The GeneXpert MRSA Assay (FDA approved for NASAL specimens only), is one component of a comprehensive MRSA colonization surveillance program. It is not intended to diagnose MRSA infection nor to guide or monitor treatment for MRSA infections.   Aerobic/Anaerobic Culture (surgical/deep wound)     Status: None (Preliminary result)   Collection Time: 10/27/16  8:48 AM  Result Value Ref Range Status   Specimen Description ABSCESS  Final   Special Requests LEFT FRONTAL LOBE BRAIN MASS  Final   Gram Stain   Final    ABUNDANT WBC PRESENT, PREDOMINANTLY PMN ABUNDANT GRAM POSITIVE COCCI IN PAIRS IN CHAINS CRITICAL RESULT CALLED TO, READ BACK BY AND VERIFIED WITH: J KEY,RN AT 3875 10/27/16 BY L BENFIELD    Culture   Final    ABUNDANT STREPTOCOCCUS INTERMEDIUS NO ANAEROBES ISOLATED; CULTURE IN PROGRESS FOR 5 DAYS    Report Status PENDING  Incomplete   Organism ID, Bacteria STREPTOCOCCUS INTERMEDIUS  Final      Susceptibility   Streptococcus intermedius - MIC*    PENICILLIN <=0.06 SENSITIVE Sensitive     CEFTRIAXONE 0.25 SENSITIVE Sensitive     ERYTHROMYCIN >=8 RESISTANT Resistant     LEVOFLOXACIN 1 SENSITIVE Sensitive     VANCOMYCIN 0.5 SENSITIVE Sensitive     * ABUNDANT STREPTOCOCCUS INTERMEDIUS  Aerobic/Anaerobic Culture (surgical/deep wound)     Status: None (Preliminary result)   Collection Time: 10/27/16  9:14 AM  Result Value Ref Range Status   Specimen Description TISSUE  Final   Special Requests LEFT FRONTAL LOBE BRAIN MASS  Final   Gram Stain   Final    MODERATE WBC PRESENT,BOTH PMN AND MONONUCLEAR RARE GRAM POSITIVE COCCI IN PAIRS CRITICAL RESULT CALLED TO, READ BACK BY AND VERIFIED WITH: P LOPEZ,RN AT 1013 10/27/16 BY L BENFIELD    Culture   Final    FEW  STREPTOCOCCUS INTERMEDIUS SUSCEPTIBILITIES PERFORMED ON PREVIOUS CULTURE WITHIN THE LAST 5 DAYS. NO ANAEROBES ISOLATED; CULTURE IN PROGRESS FOR 5 DAYS    Report Status PENDING  Incomplete  ASSESSMENT: She has a solitary brain abscess. Cultures have grown Streptococcus intermedius sensitive to beta-lactam antibiotics. This is often part of a polymicrobial infection. I plan on continuing ceftriaxone and metronidazole for a minimum of 6 weeks. She is tolerating her oral metronidazole.  PLAN: 1. Continue current antibiotics for a minimum of 6 weeks 2. I will sign off now and arrange follow-up in my clinic in one month  Diagnosis: Brain abscess  Culture Result: Streptococcus intermedius  Allergies  Allergen Reactions  . Sulfa Antibiotics     Discharge antibiotics:IV ceftriaxone and oral metronidazole Per pharmacy protocol Ceftriaxone  Duration: 6 weeks End Date: 12/08/2016  [facility] Per Protocol:  Labs weekly while on IV antibiotics: _x_ CBC with differential _x_ BMP __ CMP __ CRP __ ESR __ Vancomycin trough  __ Please pull PIC at completion of IV antibiotics _x_ Please leave PIC in place until doctor has seen patient or been notified  Fax weekly labs to [phone]  Clinic Follow Up Appt: I will arrange follow-up in my clinic within 3-4 weeks  Michel Bickers, MD [facility] for [place] [phone] pager   [phone] cell 10/30/2016, 1:59 PM

## 2016-10-30 NOTE — Progress Notes (Signed)
Inpatient Rehabilitation  Case has been opened to begin insurance authorization; OT evaluation added to faxed clinicals.  Plan to discuss rehab with patient and daughter today.  Will follow for timing of medical readiness and insurance authorization.  Please call with questions.     Carmelia Roller., CCC/SLP Admission Coordinator  [place]  Cell [phone]

## 2016-10-30 NOTE — Discharge Summary (Signed)
Physician Discharge Summary  Alexandria [name] MRN: [id number] DOB/AGE: Dec 29, 1945 71 y.o.  PCP: Osborne Casco, MD   Admit date: 10/22/2016 Discharge date: 10/30/2016  Discharge Diagnoses:    Principal Problem:   Brain abscess Active Problems:   Acute encephalopathy   Hx of breast cancer   Essential hypertension   Hypokalemia   Hyperglycemia   Brain tumor ([place])    Follow-up recommendations Follow-up with PCP in 3-5 days , including all  additional recommended appointments as below Follow-up CBC, CMP in 3-5 days  Instructions from neurosurgery Two week office f/u with Dr Vertell Limber for staple removal. Ok to leave incision open to air if not draining; bacitracin ointment and Telfa to site if bleeding or drainage. Ok to wash hair and scalp gently with mild soap  Instructions from infectious disease Continue IV ceftriaxone and oral metronidazole Per pharmacy protocol Ceftriaxone  Duration:6 weeks End Date:12/08/2016 Follow CBC, BMP, Please leave PIC in place until doctor has seen patient or been notified , clinic follow-up in 3-4 weeks with Michel Bickers, MD    Current Discharge Medication List    START taking these medications   Details  benazepril (LOTENSIN) 40 MG tablet Take 1 tablet (40 mg total) by mouth daily. Qty: 30 tablet, Refills: 1    cefTRIAXone 2 g in dextrose 5 % 50 mL Inject 2 g into the vein every 12 (twelve) hours. Qty: 1 ampule, Refills: 0    dexamethasone (DECADRON) 4 MG tablet 4 mg twice a day for 3 days 4 mg daily for 3 days then discontinue Qty: 1 tablet, Refills: 0    hydrochlorothiazide (HYDRODIURIL) 25 MG tablet Take 1 tablet (25 mg total) by mouth daily. Qty: 30 tablet, Refills: 1    HYDROcodone-acetaminophen (NORCO/VICODIN) 5-325 MG tablet Take 1 tablet by mouth every 4 (four) hours as needed for moderate pain. Qty: 10 tablet, Refills: 0    levETIRAcetam (KEPPRA) 500 MG tablet Take 1 tablet (500 mg total) by mouth 2 (two) times  daily. Qty: 60 tablet, Refills: 1    metroNIDAZOLE (FLAGYL) 500 MG tablet Take 1 tablet (500 mg total) by mouth every 8 (eight) hours. Qty: 90 tablet, Refills: 1    pantoprazole (PROTONIX) 40 MG tablet Take 1 tablet (40 mg total) by mouth daily. Qty: 30 tablet, Refills: 1    saccharomyces boulardii (FLORASTOR) 250 MG capsule Take 1 capsule (250 mg total) by mouth 2 (two) times daily. Qty: 60 capsule, Refills: 0      CONTINUE these medications which have NOT CHANGED   Details  ALPRAZolam (XANAX) 0.25 MG tablet Take 0.125-0.25 mg by mouth at bedtime as needed for anxiety.     metoprolol succinate (TOPROL-XL) 100 MG 24 hr tablet Take 100 mg by mouth every evening.       STOP taking these medications     benazepril-hydrochlorthiazide (LOTENSIN HCT) 20-25 MG tablet          Discharge Condition: *Stable   Discharge Instructions Get Medicines reviewed and adjusted: Please take all your medications with you for your next visit with your Primary MD  Please request your Primary MD to go over all hospital tests and procedure/radiological results at the follow up, please ask your Primary MD to get all Hospital records sent to his/her office.  If you experience worsening of your admission symptoms, develop shortness of breath, life threatening emergency, suicidal or homicidal thoughts you must seek medical attention immediately by calling 911 or calling your MD immediately if symptoms less severe.  You must read complete instructions/literature along with all the possible adverse reactions/side effects for all the Medicines you take and that have been prescribed to you. Take any new Medicines after you have completely understood and accpet all the possible adverse reactions/side effects.   Do not drive when taking Pain medications.   Do not take more than prescribed Pain, Sleep and Anxiety Medications  Special Instructions: If you have smoked or chewed Tobacco in the last 2 yrs  please stop smoking, stop any regular Alcohol and or any Recreational drug use.  Wear Seat belts while driving.  Please note  You were cared for by a hospitalist during your hospital stay. Once you are discharged, your primary care physician will handle any further medical issues. Please note that NO REFILLS for any discharge medications will be authorized once you are discharged, as it is imperative that you return to your primary care physician (or establish a relationship with a primary care physician if you do not have one) for your aftercare needs so that they can reassess your need for medications and monitor your lab values.     Allergies  Allergen Reactions  . Sulfa Antibiotics       Disposition: CIR   Consults: Neurosurgery Infectious disease     Significant Diagnostic Studies:  Dg Chest 2 View  Result Date: 10/22/2016 CLINICAL DATA:  Increased confusion history of brain cancer EXAM: CHEST  2 VIEW COMPARISON:  None. FINDINGS: Surgical clips in the right axilla. There is a oval retrocardiac opacity which presumably represents a large hiatal hernia. Cannot exclude atelectasis or infiltrate at the left base. No pneumothorax. IMPRESSION: 1. Difficult to exclude atelectasis or infiltrate at the left lung base 2. Retrocardiac opacity likely reflects a moderate to large hiatal hernia. Electronically Signed   By: Donavan Foil M.D.   On: 10/22/2016 21:32   Ct Head Wo Contrast  Result Date: 10/22/2016 CLINICAL DATA:  Altered mental status since yesterday. EXAM: CT HEAD WITHOUT CONTRAST TECHNIQUE: Contiguous axial images were obtained from the base of the skull through the vertex without intravenous contrast. COMPARISON:  None. FINDINGS: Brain: Masslike lesion within the peripheral aspects of the left frontal lobe, measuring approximately 2.7 x 2.5 cm, with surrounding vasogenic edema. Associated local mass effect with sulcal effacement and slight rightward midline shift of the  anterior falx. No intracranial hemorrhage seen. Vascular: There are chronic calcified atherosclerotic changes of the large vessels at the skull base. No unexpected hyperdense vessel. Skull: Normal. Negative for fracture or focal lesion. Sinuses/Orbits: No acute finding. Other: None. IMPRESSION: 1. Masslike lesion within the left frontal lobe, measuring approximately 2.7 x 2.5 cm, with surrounding vasogenic edema, highly suspicious for neoplastic mass. Ordering physician tells me that patient has a history of breast cancer, therefore, this is a suspected intracranial metastasis. Recommend MRI brain with contrast for further characterization. 2. Local mass effect within the left frontal lobe with an associated mild rightward midline shift of the anterior falx. 3. No intracranial hemorrhage seen. These results were called by telephone at the time of interpretation on 10/22/2016 at 4:55 pm to Dr. Domenic Moras , who verbally acknowledged these results. Electronically Signed   By: Franki Cabot M.D.   On: 10/22/2016 16:59   Ct Chest W Contrast  Result Date: 10/23/2016 CLINICAL DATA:  Sepsis. Pt denies chest pain or sob. Pt denies abd pain or complaints. EXAM: CT CHEST, ABDOMEN, AND PELVIS WITH CONTRAST TECHNIQUE: Multidetector CT imaging of the chest, abdomen and pelvis was  performed following the standard protocol during bolus administration of intravenous contrast. CONTRAST:  138m ISOVUE-300 IOPAMIDOL (ISOVUE-300) INJECTION 61% COMPARISON:  None. FINDINGS: CT CHEST FINDINGS Cardiovascular: Heart is normal size. Aortic calcifications. No aneurysm. Scattered coronary artery calcifications. Mediastinum/Nodes: No mediastinal, hilar, or axillary adenopathy. Large hiatal hernia containing much of the stomach. Trachea is unremarkable. Thyroid and thoracic inlet unremarkable. Lungs/Pleura: Linear platelike scarring or atelectasis in the lower lobes adjacent to the large hiatal hernia. No effusions. No suspicious nodules.  Musculoskeletal: No acute bony abnormality. CT ABDOMEN PELVIS FINDINGS Hepatobiliary: Large central cyst in the liver measuring 12.5 x 11 cm. Other smaller scattered cysts. Gallbladder grossly unremarkable. Pancreas: No focal abnormality or ductal dilatation. Spleen: No focal abnormality.  Normal size. Adrenals/Urinary Tract: Small renal cysts bilaterally. No hydronephrosis or suspicious mass. Adrenal glands and urinary bladder unremarkable. Stomach/Bowel: Large and small bowel decompressed, unremarkable. Vascular/Lymphatic: No evidence of aneurysm or adenopathy. Diffuse aortic and iliac calcifications. Reproductive: Prior hysterectomy.  No adnexal masses. Other: No free fluid or free air. Musculoskeletal: No acute bony abnormality. Hemangioma in the L1 vertebral body. IMPRESSION: No acute findings in the chest, abdomen or pelvis. Large hiatal hernia. Compressive atelectasis or scarring in the lower lobes bilaterally. Aortic atherosclerosis, scattered coronary artery disease. Hepatic and renal cysts. Electronically Signed   By: KRolm BaptiseM.D.   On: 10/23/2016 08:42   Mr BJeri CosWGDContrast  Result Date: 10/24/2016 CLINICAL DATA:  Continued surveillance LEFT frontal mass. Brain lab protocol. EXAM: MRI HEAD WITHOUT AND WITH CONTRAST TECHNIQUE: Multiplanar, multiecho pulse sequences of the brain and surrounding structures were obtained without and with intravenous contrast. CONTRAST:  170mMULTIHANCE GADOBENATE DIMEGLUMINE 529 MG/ML IV SOLN COMPARISON:  10/22/2016 MR.  10/22/2016 CT. FINDINGS: Brain: Ovoid 22 x 31 x 28 mm LEFT frontal mass, epicenter gray-white junction, marked restricted diffusion centrally, irregular thin and thick walled enhancement, adjacent abnormal dural enhancement, marked surrounding edema, all favoring brain abscess. Primary brain tumor or metastasis less favored. No cortical venous or sagittal sinus thrombosis. Some subfalcine herniation LEFT-to-RIGHT, approximately 2 mm. No  significant change from priors. Diffusion tensor imaging reveals the lesion is significantly anterior to the motor strip, approximately 5 cm cephalad of the central sulcus. Superior longitudinal fasciculus is mildly depressed in its anterior margin. Vascular: Normal flow voids. Skull and upper cervical spine: Normal marrow signal. Sinuses/Orbits: Negative. Other: None. IMPRESSION: Unchanged LEFT frontal mass, favored to represent a brain abscess. See discussion above. Electronically Signed   By: JoStaci Righter.D.   On: 10/24/2016 15:20   Mr BrJeri CosoJMontrast  Result Date: 10/22/2016 CLINICAL DATA:  Altered mental status beginning yesterday. Increased confusion. Abnormal CT scan frontal brain mass. EXAM: MRI HEAD WITHOUT AND WITH CONTRAST TECHNIQUE: Multiplanar, multiecho pulse sequences of the brain and surrounding structures were obtained without and with intravenous contrast. CONTRAST:  15 mL MultiHance COMPARISON:  CT head without contrast from the same day. FINDINGS: Brain: A peripherally enhancing left frontal lesion demonstrates a somewhat irregular thickened periphery. There is some T1 shortening along the rim of the lesion as well. The central contents demonstrate restricted diffusion. There is marked surrounding edema results in mass effect and effacement of the adjacent sulci. Minimal midline shift is noted. The enhancement involves the adjacent aura extends along the anterior left frontal convexity. No other focal areas of restricted diffusion or enhancement are present. There are is no acute hemorrhage or other mass lesion. No acute infarct is present. The internal auditory canals are within  normal limits bilaterally. The brainstem and cerebellum are normal. Vascular: Flow is present in the major intracranial arteries. Skull and upper cervical spine: The skullbase is within normal limits. Midline sagittal structures are unremarkable. The craniocervical junction is normal. Marrow signal is normal.  The upper cervical spine is within normal limits. Sinuses/Orbits: Mild mucosal thickening is present within the ethmoid air cells bilaterally. The remaining paranasal sinuses and mastoid air cells are clear. IMPRESSION: 1. Irregular peripherally enhancing mass lesion in the anterior left frontal lobe measuring at 2.6 x 3.0 x 2.0 cm with associated enhancement of the adjacent dura and central restricted diffusion is most consistent with an abscess. Neoplasm is considered much less likely. 2. No other discrete lesions. 3. Minimal ethmoid sinus disease. These results were called by telephone at the time of interpretation on 10/22/2016 at 8:33 pm to [facility] PA , who verbally acknowledged these results. Electronically Signed   By: San Morelle M.D.   On: 10/22/2016 20:37   Ct Abdomen Pelvis W Contrast  Result Date: 10/23/2016 CLINICAL DATA:  Sepsis. Pt denies chest pain or sob. Pt denies abd pain or complaints. EXAM: CT CHEST, ABDOMEN, AND PELVIS WITH CONTRAST TECHNIQUE: Multidetector CT imaging of the chest, abdomen and pelvis was performed following the standard protocol during bolus administration of intravenous contrast. CONTRAST:  175m ISOVUE-300 IOPAMIDOL (ISOVUE-300) INJECTION 61% COMPARISON:  None. FINDINGS: CT CHEST FINDINGS Cardiovascular: Heart is normal size. Aortic calcifications. No aneurysm. Scattered coronary artery calcifications. Mediastinum/Nodes: No mediastinal, hilar, or axillary adenopathy. Large hiatal hernia containing much of the stomach. Trachea is unremarkable. Thyroid and thoracic inlet unremarkable. Lungs/Pleura: Linear platelike scarring or atelectasis in the lower lobes adjacent to the large hiatal hernia. No effusions. No suspicious nodules. Musculoskeletal: No acute bony abnormality. CT ABDOMEN PELVIS FINDINGS Hepatobiliary: Large central cyst in the liver measuring 12.5 x 11 cm. Other smaller scattered cysts. Gallbladder grossly unremarkable. Pancreas: No focal abnormality  or ductal dilatation. Spleen: No focal abnormality.  Normal size. Adrenals/Urinary Tract: Small renal cysts bilaterally. No hydronephrosis or suspicious mass. Adrenal glands and urinary bladder unremarkable. Stomach/Bowel: Large and small bowel decompressed, unremarkable. Vascular/Lymphatic: No evidence of aneurysm or adenopathy. Diffuse aortic and iliac calcifications. Reproductive: Prior hysterectomy.  No adnexal masses. Other: No free fluid or free air. Musculoskeletal: No acute bony abnormality. Hemangioma in the L1 vertebral body. IMPRESSION: No acute findings in the chest, abdomen or pelvis. Large hiatal hernia. Compressive atelectasis or scarring in the lower lobes bilaterally. Aortic atherosclerosis, scattered coronary artery disease. Hepatic and renal cysts. Electronically Signed   By: KRolm BaptiseM.D.   On: 10/23/2016 08:42       Filed Weights   10/22/16 1516 10/23/16 0143  Weight: 71.2 kg (157 lb) 74.9 kg (165 lb 2 oz)     Microbiology: Recent Results (from the past 240 hour(s))  Culture, blood (x 2)     Status: None   Collection Time: 10/23/16  2:55 AM  Result Value Ref Range Status   Specimen Description BLOOD LEFT ANTECUBITAL  Final   Special Requests BOTTLES DRAWN AEROBIC AND ANAEROBIC 10CC EACH  Final   Culture NO GROWTH 5 DAYS  Final   Report Status 10/28/2016 FINAL  Final  Culture, blood (x 2)     Status: None   Collection Time: 10/23/16  3:04 AM  Result Value Ref Range Status   Specimen Description BLOOD LEFT HAND  Final   Special Requests BOTTLES DRAWN AEROBIC AND ANAEROBIC 10CC EACH  Final   Culture NO  GROWTH 5 DAYS  Final   Report Status 10/28/2016 FINAL  Final  Culture, Urine     Status: Abnormal   Collection Time: 10/24/16  8:25 AM  Result Value Ref Range Status   Specimen Description URINE, RANDOM  Final   Special Requests NONE  Final   Culture MULTIPLE SPECIES PRESENT, SUGGEST RECOLLECTION (A)  Final   Report Status 10/25/2016 FINAL  Final  MRSA PCR  Screening     Status: None   Collection Time: 10/25/16  3:29 PM  Result Value Ref Range Status   MRSA by PCR NEGATIVE NEGATIVE Final    Comment:        The GeneXpert MRSA Assay (FDA approved for NASAL specimens only), is one component of a comprehensive MRSA colonization surveillance program. It is not intended to diagnose MRSA infection nor to guide or monitor treatment for MRSA infections.   Aerobic/Anaerobic Culture (surgical/deep wound)     Status: None (Preliminary result)   Collection Time: 10/27/16  8:48 AM  Result Value Ref Range Status   Specimen Description ABSCESS  Final   Special Requests LEFT FRONTAL LOBE BRAIN MASS  Final   Gram Stain   Final    ABUNDANT WBC PRESENT, PREDOMINANTLY PMN ABUNDANT GRAM POSITIVE COCCI IN PAIRS IN CHAINS CRITICAL RESULT CALLED TO, READ BACK BY AND VERIFIED WITH: J KEY,RN AT 7672 10/27/16 BY L BENFIELD    Culture   Final    ABUNDANT STREPTOCOCCUS INTERMEDIUS NO ANAEROBES ISOLATED; CULTURE IN PROGRESS FOR 5 DAYS    Report Status PENDING  Incomplete   Organism ID, Bacteria STREPTOCOCCUS INTERMEDIUS  Final      Susceptibility   Streptococcus intermedius - MIC*    PENICILLIN <=0.06 SENSITIVE Sensitive     CEFTRIAXONE 0.25 SENSITIVE Sensitive     ERYTHROMYCIN >=8 RESISTANT Resistant     LEVOFLOXACIN 1 SENSITIVE Sensitive     VANCOMYCIN 0.5 SENSITIVE Sensitive     * ABUNDANT STREPTOCOCCUS INTERMEDIUS  Aerobic/Anaerobic Culture (surgical/deep wound)     Status: None (Preliminary result)   Collection Time: 10/27/16  9:14 AM  Result Value Ref Range Status   Specimen Description TISSUE  Final   Special Requests LEFT FRONTAL LOBE BRAIN MASS  Final   Gram Stain   Final    MODERATE WBC PRESENT,BOTH PMN AND MONONUCLEAR RARE GRAM POSITIVE COCCI IN PAIRS CRITICAL RESULT CALLED TO, READ BACK BY AND VERIFIED WITH: P LOPEZ,RN AT 1013 10/27/16 BY L BENFIELD    Culture   Final    FEW STREPTOCOCCUS INTERMEDIUS SUSCEPTIBILITIES PERFORMED ON PREVIOUS  CULTURE WITHIN THE LAST 5 DAYS. NO ANAEROBES ISOLATED; CULTURE IN PROGRESS FOR 5 DAYS    Report Status PENDING  Incomplete       Blood Culture    Component Value Date/Time   SDES TISSUE 10/27/2016 0914   SPECREQUEST LEFT FRONTAL LOBE BRAIN MASS 10/27/2016 0914   CULT  10/27/2016 0914    FEW STREPTOCOCCUS INTERMEDIUS SUSCEPTIBILITIES PERFORMED ON PREVIOUS CULTURE WITHIN THE LAST 5 DAYS. NO ANAEROBES ISOLATED; CULTURE IN PROGRESS FOR 5 DAYS    REPTSTATUS PENDING 10/27/2016 0914      Labs: Results for orders placed or performed during the hospital encounter of 10/22/16 (from the past 48 hour(s))  Glucose, capillary     Status: Abnormal   Collection Time: 10/28/16  4:06 PM  Result Value Ref Range   Glucose-Capillary 166 (H) 65 - 99 mg/dL  Glucose, capillary     Status: Abnormal   Collection Time: 10/28/16  9:46  PM  Result Value Ref Range   Glucose-Capillary 126 (H) 65 - 99 mg/dL   Comment 1 Notify RN    Comment 2 Document in Chart   Comprehensive metabolic panel     Status: Abnormal   Collection Time: 10/29/16  4:30 AM  Result Value Ref Range   Sodium 139 135 - 145 mmol/L   Potassium 3.5 3.5 - 5.1 mmol/L   Chloride 106 101 - 111 mmol/L   CO2 25 22 - 32 mmol/L   Glucose, Bld 138 (H) 65 - 99 mg/dL   BUN 16 6 - 20 mg/dL   Creatinine, Ser 0.74 0.44 - 1.00 mg/dL   Calcium 8.4 (L) 8.9 - 10.3 mg/dL   Total Protein 5.0 (L) 6.5 - 8.1 g/dL   Albumin 2.5 (L) 3.5 - 5.0 g/dL   AST 20 15 - 41 U/L   ALT 31 14 - 54 U/L   Alkaline Phosphatase 50 38 - 126 U/L   Total Bilirubin 0.5 0.3 - 1.2 mg/dL   GFR calc non Af Amer >60 >60 mL/min   GFR calc Af Amer >60 >60 mL/min    Comment: (NOTE) The eGFR has been calculated using the CKD EPI equation. This calculation has not been validated in all clinical situations. eGFR's persistently <60 mL/min signify possible Chronic Kidney Disease.    Anion gap 8 5 - 15  Glucose, capillary     Status: Abnormal   Collection Time: 10/29/16  6:03 AM   Result Value Ref Range   Glucose-Capillary 134 (H) 65 - 99 mg/dL   Comment 1 Notify RN    Comment 2 Document in Chart   Glucose, capillary     Status: Abnormal   Collection Time: 10/29/16 11:37 AM  Result Value Ref Range   Glucose-Capillary 128 (H) 65 - 99 mg/dL  Glucose, capillary     Status: Abnormal   Collection Time: 10/29/16  4:49 PM  Result Value Ref Range   Glucose-Capillary 123 (H) 65 - 99 mg/dL  Glucose, capillary     Status: Abnormal   Collection Time: 10/29/16  9:58 PM  Result Value Ref Range   Glucose-Capillary 134 (H) 65 - 99 mg/dL   Comment 1 Notify RN    Comment 2 Document in Chart   Glucose, capillary     Status: Abnormal   Collection Time: 10/30/16  6:19 AM  Result Value Ref Range   Glucose-Capillary 129 (H) 65 - 99 mg/dL   Comment 1 Notify RN    Comment 2 Document in Chart   Glucose, capillary     Status: Abnormal   Collection Time: 10/30/16 11:36 AM  Result Value Ref Range   Glucose-Capillary 126 (H) 65 - 99 mg/dL     Lipid Panel  No results found for: CHOL, TRIG, HDL, CHOLHDL, VLDL, LDLCALC, LDLDIRECT   Lab Results  Component Value Date   HGBA1C 5.8 (H) 10/24/2016      HPI :   71 y.o. right handed female with history of hypertension, remote history of breast cancer 1996. Per chart review patient lives alone and was independent prior to admission. 2 level home with bedroom on main level. Family and area question 24-hour assist. Presented 10/22/2016 with altered mental status that progressed over the past few days. CT imaging demonstrated a masslike lesion in the left frontal lobe with surrounding vasogenic edema and localized mass effect question malignancy versus abscess. CT of abdomen and pelvis as well as CT of chest negative for acute findings. Neurosurgery  consulted and underwent left craniotomy tumor excision 10/27/2016 per Dr. Vertell Limber. Infectious disease consulted with blood cultures showing Streptococcus intermedius . Patient started on  Rocephin/Flagyl for 6 weeks. Patient will need antibiotics for 6 weeks until 12/08/16. Decadron is being tapered as above.  patient to continue Keppra for seizure prophylaxis. Patient would need neurosurgery/infectious disease follow-up  HOSPITAL COURSE: *  Left frontal lobe ring-enhancing mass, brain abscess/ status post craniotomy 3/26 - CT head without contrast 10/22/16: Left frontal lobe mass lesion to by 7 x 2.5 cm with surrounding vasogenic edema, highly suspicious for neoplastic mass initially. Local mass effect within the left frontal lobe with associated mild rightward midline shift. No intracranial hemorrhage. - MRI brain 3/21: Irregular peripherally enhancing mass lesion in the anterior left frontal lobe measuring 2.6 x 3 x 2 cm with associated enhancement of the adjacent dura and central restricted diffusion most consistent with an abscess. Neoplasm considered less likely. -Surveillance CT chest abdomen and pelvis without acute lesions or lesions suspicious for malignancy. - Neurosurgery consulted  .  Marland Kitchen Although radiologist interpreted her brain MRI is most consistent with abscess, given lack of leukocytosis and fever, surgeons are concerned about a primary brain tumor.   - Patient is S/P Craniotomy and brain abscess evacuation 10/27/16. Gram stain shows gram-positive cocci in pairs & chains. ID consulted, suspect that her solitary left frontal brain abscess was probably due to an episode of bacteremia  , abscess.  Cultures have grown Streptococcus intermedius. This is often part of a polymicrobial infection. ID  plans on continuing ceftriaxone and metronidazole for a minimum of 6 weeks.. Stop date 12/08/16. Blood cultures 2: Negative to date. Patient will need Two week office f/u with DrStern for staple removal. Ok to leave incision open to air if not draining; bacitracin ointment and Telfa to site if bleeding or drainage. Ok to wash hair and scalp gently with mild soap Follow-up with infectious  disease in 3-4 weeks  Acute encephalopathy, improving - Secondary to brain lesion as above. On steroids per neurosurgery. Taper Decadron over the next week patient being discharged to inpatient rehabilitation for continued assessments  Hypokalemia Replaced. Magnesium normal.  Essential hypertension Uncontrolled. Continue Toprol-XL &. Benazepril-HCTZ. I did increase Benzapril dose to 40 mg a day Expected blood pressure to improve once the patient's Decadron is discontinued  Mild hyperglycemia Probably related to steroids. Monitor CBGs and consider SSI. A1c 5.8.  Sinus tachycardia Resolved, TSH normal     Discharge Exam: *  Blood pressure (!) 164/99, pulse 82, temperature 98.2 F (36.8 C), temperature source Oral, resp. rate 18, height _0  (1.651 m), weight 74.9 kg (165 lb 2 oz), SpO2 97 %. General exam: Pleasant elderly female lying comfortably supine in bed. Respiratory system: Clear to auscultation. Respiratory effort normal. Cardiovascular system: S1 & S2 heard, RRR. No JVD, murmurs, rubs, gallops or clicks. No pedal edema.Telemetry: Sinus rhythm.  Gastrointestinal system: Abdomen is nondistended, soft and nontender. No organomegaly or masses felt. Normal bowel sounds heard. Central nervous system: Alert and oriented. Dysphasia +. Speech more fluent. Extremities: Symmetric 5 x 5 power. Skin: No rashes, lesions or ulcers Psychiatry: Judgement and insight appear normal. Mood & affect appropriate     Follow-up Information    Osborne Casco, MD. Call.   Specialty:  Family Medicine Why:  Hospital follow-up Contact information: [address] [place] [address] [phone]        Peggyann Shoals, MD. Call.   Specialty:  Neurosurgery Why:  To confirm follow-up appointment with neurosurgery Contact information: [place]. [address] [place] [address] [phone]        Michel Bickers, MD. Call.   Specialty:   Infectious Diseases Why:  To confirm follow-up appointment Contact information: [address] [place] [address] [phone]           Signed: Reyne Dumas 10/30/2016, 12:16 PM        Time spent >45 mins

## 2016-10-30 NOTE — Progress Notes (Signed)
Alexandria Blake, MD Physician Signed Physical Medicine and Rehabilitation  Consult Note Date of Service: 10/29/2016 8:52 AM  Related encounter: ED to Hosp-Admission (Current) from 10/22/2016 in [facility] All Collapse All   [] Hide copied text [] Hover for attribution information      Physical Medicine and Rehabilitation Consult Reason for Consult: Altered mental status with acute encephalopathy related to left frontal mass Referring Physician: [facility]   HPI: Alexandria [name] is a 71 y.o. right handed female with history of hypertension, remote history of breast cancer 1996. Per chart review patient lives alone and was independent prior to admission. 2 level home with bedroom on main level. Family and area question 24-hour assist. Presented 10/22/2016 with altered mental status that progressed over the past few days. CT imaging demonstrated a masslike lesion in the left frontal lobe with surrounding vasogenic edema and localized mass effect question malignancy versus abscess. CT of abdomen and pelvis as well as CT of chest negative for acute findings. Neurosurgery consulted and underwent left craniotomy tumor excision 10/27/2016 per Dr. Vertell Limber. Infectious disease consulted with blood cultures showing Streptococcus intermedius and presently on broad-spectrum antibiotics anticipate 6 weeks of antibiotic care.Decadron protocol as directed. Keppra added for seizure prophylaxis. Tolerating a regular diet. Physical therapy evaluation completed with recommendations of physical medicine rehabilitation consult.  Daughter at bedside. Reviewed therapy notes, patient impulsive, decreased safety awareness  Review of Systems  Constitutional: Positive for malaise/fatigue. Negative for chills and fever.  HENT: Negative for hearing loss.   Eyes: Negative for blurred vision and double vision.  Respiratory: Negative for cough and shortness of  breath.   Cardiovascular: Negative for chest pain, palpitations and leg swelling.  Gastrointestinal: Positive for constipation. Negative for nausea and vomiting.  Genitourinary: Negative for dysuria, flank pain and hematuria.  Musculoskeletal: Positive for myalgias. Negative for falls.  Skin: Negative for rash.  Neurological: Positive for dizziness and headaches.  Psychiatric/Behavioral: The patient has insomnia.   All other systems reviewed and are negative.      Past Medical History:  Diagnosis Date  . Cancer [facility]) 1996   breast cx  . Hypertension         Past Surgical History:  Procedure Laterality Date  . ABDOMINAL HYSTERECTOMY    . APPLICATION OF CRANIAL NAVIGATION N/A 10/27/2016   Procedure: APPLICATION OF CRANIAL NAVIGATION;  Surgeon: Erline Levine, MD;  Location: [place] [facility];  Service: Neurosurgery;  Laterality: N/A;  . CRANIOTOMY Left 10/27/2016   Procedure: CRANIOTOMY TUMOR EXCISION;  Surgeon: Erline Levine, MD;  Location: [facility];  Service: Neurosurgery;  Laterality: Left;  Marland Kitchen MASTECTOMY     right breast        Family History  Problem Relation Age of Onset  . Breast cancer Mother   . Colon cancer Father    Social History:  reports that she has never smoked. She has never used smokeless tobacco. She reports that she drinks alcohol. She reports that she does not use drugs. Allergies:      Allergies  Allergen Reactions  . Sulfa Antibiotics          Medications Prior to Admission  Medication Sig Dispense Refill  . ALPRAZolam (XANAX) 0.25 MG tablet Take 0.125-0.25 mg by mouth at bedtime as needed for anxiety.     . benazepril-hydrochlorthiazide (LOTENSIN HCT) 20-25 MG tablet Take 1 tablet by mouth daily.     . metoprolol succinate (TOPROL-XL) 100 MG 24 hr tablet  Take 100 mg by mouth every evening.       Home: Home Living Family/patient expects to be discharged to:: Private residence Living Arrangements: Alone Available Help at Discharge: Family,  Friend(s), Available 24 hours/day Type of Home: House Home Access: Other (comment) (threshold to get into the house ) Entrance Stairs-Number of Steps: 1 Entrance Stairs-Rails: None Home Layout: Two level, Able to live on main level with bedroom/bathroom Bathroom Shower/Tub: Multimedia programmer: Standard Bathroom Accessibility: Yes Home Equipment: Shower seat - built in Additional Comments: Pt sister reporting pt's children should be able to stay with pt 24/7.   Lives With: Alone  Functional History: Prior Function Level of Independence: Independent Functional Status:  Mobility: Bed Mobility Overal bed mobility: Independent General bed mobility comments: In chair upon entry  Transfers Overall transfer level: Needs assistance Equipment used: None Transfers: Sit to/from Stand Sit to Stand: Min guard General transfer comment: Min guard for safety. Pt demonstrating impulsivity with transfers and required multiple safety cues from PT to wait for PT to perform transfers. When transferring to toilet, pt pulling off gait belt stating "I don't need this!" PT educated pt about purpose of gait belt, and allowed PT to place back on.  Ambulation/Gait Ambulation/Gait assistance: Min guard Ambulation Distance (Feet): 100 Feet Assistive device: None Gait Pattern/deviations: Step-through pattern, Decreased stride length, Drifts right/left General Gait Details: Min guard for safety. Required multiple cues for safe gait speed. Pt requiring safety cues throughout for safety with mobility.  Gait velocity interpretation: >2.62 ft/sec, indicative of independent community ambulator Stairs: Yes Stairs assistance: Min guard Stair Management: One rail Left, Alternating pattern Number of Stairs: 11 General stair comments: pt briskly ascended and descended stairs before any instruction given.  ADL:  Cognition: Cognition Overall Cognitive Status: Impaired/Different from  baseline Arousal/Alertness: Awake/alert Orientation Level: Oriented X4 Attention: Selective Selective Attention: Impaired Selective Attention Impairment: Verbal basic Memory: Impaired Memory Impairment: Retrieval deficit, Decreased recall of new information Awareness: Appears intact Cognition Arousal/Alertness: Awake/alert Behavior During Therapy: Impulsive Overall Cognitive Status: Impaired/Different from baseline Area of Impairment: Safety/judgement, Following commands, Attention, Problem solving, Memory Current Attention Level: Sustained Memory: Decreased short-term memory Following Commands: Follows one step commands inconsistently, Follows multi-step commands inconsistently, Follows multi-step commands with increased time Safety/Judgement: Decreased awareness of safety, Decreased awareness of deficits Problem Solving: Slow processing, Difficulty sequencing, Requires verbal cues, Requires tactile cues General Comments: Pt very impulsive and required multiple safety cues to wait for PT to perform mobility tasks. Pt sister unaware of deficits and when attempting to educate, pt sister slightly defensive and resistive to safety awareness education stating "she will get better as time goes on".   Blood pressure (!) 181/82, pulse 69, temperature 98.2 F (36.8 C), temperature source Oral, resp. rate 20, height 5\' 5"  (1.651 m), weight 74.9 kg (165 lb 2 oz), SpO2 95 %. Physical Exam  Vitals reviewed. HENT:  Craniotomy site clean and dry  Eyes: EOM are normal.  Neck: Normal range of motion. Neck supple. No thyromegaly present.  Cardiovascular: Normal rate and regular rhythm.   Respiratory: Effort normal and breath sounds normal. No respiratory distress.  GI: Soft. Bowel sounds are normal. She exhibits no distension.  Neurological: She is alert.  Makes good eye contact with examiner. Follows simple commands. She was able to provide her age but needed multiple cues for date of birth. She  does exhibit some decrease in recall of recent events.  Motor strength is 5/5 bilateral deltoid, bicep, tricep grip. 4/5. Bilateral  hip flexor, knee extensor, ankle dorsiflexor. She remembers 2 out of 3 unrelated objects after 2 minute delay. Affect is inappropriate. No evidence of lability or agitation  Lab Results Last 24 Hours       Results for orders placed or performed during the hospital encounter of 10/22/16 (from the past 24 hour(s))  Glucose, capillary     Status: Abnormal   Collection Time: 10/28/16 11:42 AM  Result Value Ref Range   Glucose-Capillary 146 (H) 65 - 99 mg/dL   Comment 1 Notify RN    Comment 2 Document in Chart   Glucose, capillary     Status: Abnormal   Collection Time: 10/28/16  4:06 PM  Result Value Ref Range   Glucose-Capillary 166 (H) 65 - 99 mg/dL  Glucose, capillary     Status: Abnormal   Collection Time: 10/28/16  9:46 PM  Result Value Ref Range   Glucose-Capillary 126 (H) 65 - 99 mg/dL   Comment 1 Notify RN    Comment 2 Document in Chart   Comprehensive metabolic panel     Status: Abnormal   Collection Time: 10/29/16  4:30 AM  Result Value Ref Range   Sodium 139 135 - 145 mmol/L   Potassium 3.5 3.5 - 5.1 mmol/L   Chloride 106 101 - 111 mmol/L   CO2 25 22 - 32 mmol/L   Glucose, Bld 138 (H) 65 - 99 mg/dL   BUN 16 6 - 20 mg/dL   Creatinine, Ser 0.74 0.44 - 1.00 mg/dL   Calcium 8.4 (L) 8.9 - 10.3 mg/dL   Total Protein 5.0 (L) 6.5 - 8.1 g/dL   Albumin 2.5 (L) 3.5 - 5.0 g/dL   AST 20 15 - 41 U/L   ALT 31 14 - 54 U/L   Alkaline Phosphatase 50 38 - 126 U/L   Total Bilirubin 0.5 0.3 - 1.2 mg/dL   GFR calc non Af Amer >60 >60 mL/min   GFR calc Af Amer >60 >60 mL/min   Anion gap 8 5 - 15  Glucose, capillary     Status: Abnormal   Collection Time: 10/29/16  6:03 AM  Result Value Ref Range   Glucose-Capillary 134 (H) 65 - 99 mg/dL   Comment 1 Notify RN    Comment 2 Document in Chart      Imaging Results  (Last 48 hours)  No results found.    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 Does the need for close, 24 hr/day medical supervision in concert with the patient's rehab needs make it unreasonable for this patient to be served in a less intensive setting? Yes 2. Co-Morbidities requiring supervision/potential complications: Hypertension, bacteremia 3. Due to bladder management, bowel management, safety, skin/wound care, disease management, medication administration, pain management and patient education, does the patient require 24 hr/day rehab nursing? Yes 4. Does the patient require coordinated care of a physician, rehab nurse, PT (1-2 hrs/day, 5 days/week), OT (1-2 hrs/day, 5 days/week) and SLP (.5-1 hrs/day, 5 days/week) to address physical and functional deficits in the context of the above medical diagnosis(es)? Yes Addressing deficits in the following areas: balance, endurance, locomotion, strength, transferring, bowel/bladder control, bathing, dressing, feeding, grooming, toileting, cognition and psychosocial support 5. Can the patient actively participate in an intensive therapy program of at least 3 hrs of therapy per day at least 5 days per week? Yes 6. The potential for patient to make measurable gains while on inpatient rehab is excellent 7.  Anticipated functional outcomes upon discharge from inpatient rehab are modified independent  with PT, modified independent with OT, modified independent with SLP. 8. Estimated rehab length of stay to reach the above functional goals is: 7d 9. Does the patient have adequate social supports and living environment to accommodate these discharge functional goals? Yes 10. Anticipated D/C setting: Home 11. Anticipated post D/C treatments: [place] therapy 12. Overall Rehab/Functional Prognosis: excellent  RECOMMENDATIONS: This patient's condition is appropriate for continued rehabilitative care  in the following setting: CIR Patient has agreed to participate in recommended program. Yes Note that insurance prior authorization may be required for reimbursement for recommended care.  Comment:   Alexandria [name] M.D. [facility] FAAPM&R (Sports Med, Neuromuscular Med) Diplomate Am Board of Electrodiagnostic Med    Cathlyn Parsons., PA-C 10/29/2016    Revision History                        Routing History

## 2016-10-31 ENCOUNTER — Inpatient Hospital Stay (HOSPITAL_COMMUNITY): Payer: Medicare Other | Admitting: Speech Pathology

## 2016-10-31 ENCOUNTER — Inpatient Hospital Stay (HOSPITAL_COMMUNITY): Payer: Medicare Other

## 2016-10-31 ENCOUNTER — Inpatient Hospital Stay (HOSPITAL_COMMUNITY): Payer: Medicare Other | Admitting: Occupational Therapy

## 2016-10-31 ENCOUNTER — Inpatient Hospital Stay (HOSPITAL_COMMUNITY): Payer: Medicare Other | Admitting: Physical Therapy

## 2016-10-31 DIAGNOSIS — G060 Intracranial abscess and granuloma: Secondary | ICD-10-CM

## 2016-10-31 LAB — CBC WITH DIFFERENTIAL/PLATELET
BASOS PCT: 0 %
Basophils Absolute: 0 10*3/uL (ref 0.0–0.1)
EOS ABS: 0.1 10*3/uL (ref 0.0–0.7)
Eosinophils Relative: 1 %
HEMATOCRIT: 39.2 % (ref 36.0–46.0)
Hemoglobin: 12.8 g/dL (ref 12.0–15.0)
LYMPHS ABS: 0.8 10*3/uL (ref 0.7–4.0)
Lymphocytes Relative: 11 %
MCH: 28.9 pg (ref 26.0–34.0)
MCHC: 32.7 g/dL (ref 30.0–36.0)
MCV: 88.5 fL (ref 78.0–100.0)
MONO ABS: 0.6 10*3/uL (ref 0.1–1.0)
MONOS PCT: 9 %
NEUTROS ABS: 5.4 10*3/uL (ref 1.7–7.7)
Neutrophils Relative %: 79 %
Platelets: 165 10*3/uL (ref 150–400)
RBC: 4.43 MIL/uL (ref 3.87–5.11)
RDW: 13.1 % (ref 11.5–15.5)
WBC: 6.8 10*3/uL (ref 4.0–10.5)

## 2016-10-31 LAB — COMPREHENSIVE METABOLIC PANEL
ALBUMIN: 2.8 g/dL — AB (ref 3.5–5.0)
ALT: 23 U/L (ref 14–54)
ANION GAP: 10 (ref 5–15)
AST: 16 U/L (ref 15–41)
Alkaline Phosphatase: 42 U/L (ref 38–126)
BILIRUBIN TOTAL: 0.7 mg/dL (ref 0.3–1.2)
BUN: 12 mg/dL (ref 6–20)
CALCIUM: 8.9 mg/dL (ref 8.9–10.3)
CO2: 27 mmol/L (ref 22–32)
Chloride: 102 mmol/L (ref 101–111)
Creatinine, Ser: 0.8 mg/dL (ref 0.44–1.00)
GFR calc non Af Amer: >60 mL/min (ref >60)
GLUCOSE: 120 mg/dL — AB (ref 65–99)
POTASSIUM: 3.5 mmol/L (ref 3.5–5.1)
Sodium: 139 mmol/L (ref 135–145)
TOTAL PROTEIN: 5.2 g/dL — AB (ref 6.5–8.1)

## 2016-10-31 LAB — GLUCOSE, CAPILLARY
GLUCOSE-CAPILLARY: 151 mg/dL — AB (ref 65–99)
GLUCOSE-CAPILLARY: 188 mg/dL — AB (ref 65–99)
Glucose-Capillary: 100 mg/dL — ABNORMAL HIGH (ref 65–99)
Glucose-Capillary: 122 mg/dL — ABNORMAL HIGH (ref 65–99)
Glucose-Capillary: 228 mg/dL — ABNORMAL HIGH (ref 65–99)

## 2016-10-31 NOTE — Progress Notes (Signed)
Social Work Assessment and Plan  Patient Details  Name: Alexandria [name] MRN: [id number] Date of Birth: Dec 29, 1945  Today's Date: 10/31/2016  Problem List:  Patient Active Problem List   Diagnosis Date Noted  . Brain tumor ([place])   . Hyperglycemia 10/27/2016  . Acute encephalopathy 10/23/2016  . Hx of breast cancer 10/23/2016  . Essential hypertension 10/23/2016  . Hypokalemia 10/23/2016  . 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 Cancer [facility]) 1996   breast cx  . Hypertension    Past Surgical History:  Past Surgical History:  Procedure Laterality Date  . ABDOMINAL HYSTERECTOMY    . APPLICATION OF CRANIAL NAVIGATION N/A 10/27/2016   Procedure: APPLICATION OF CRANIAL NAVIGATION;  Surgeon: Erline Levine, MD;  Location: [place];  Service: Neurosurgery;  Laterality: N/A;  . CRANIOTOMY Left 10/27/2016   Procedure: CRANIOTOMY TUMOR EXCISION;  Surgeon: Erline Levine, MD;  Location: [facility];  Service: Neurosurgery;  Laterality: Left;  Marland Kitchen MASTECTOMY     right breast   Social History:  reports that she has never smoked. She has never used smokeless tobacco. She reports that she drinks alcohol. She reports that she does not use drugs.  Family / Support Systems Marital Status: Single Patient Roles: Partner, Other (Comment) (grandparent; sister) Children: Alexandria [name] - dtr in [place] - [phone] and 2 sons in [place] Other Supports: sister and friend Anticipated Caregiver: Patient with Mod I goals with intermittent supervision.  Son, Alexandria [name], reports that pt's sister will probably stay with her initially and also a friend from [place].  Dtr can assist some too. Ability/Limitations of Caregiver: Daughter works and has a family  Caregiver Availability: Intermittent (although initially pt will have 24/7 from her sister.) Family Dynamics: Supportive, helpful family.  Social History Preferred language: English Religion:  Apostolic Education: college Read: Yes Write: Yes Employment Status: Retired (transitional Oncologist) Date Retired/Disabled/Unemployed: 2017 Age Retired: 32 Legal History/Current Legal Issues: none reported Guardian/Conservator: N/A - MD has determined that pt is capable of making her own decisions.    Abuse/Neglect Physical Abuse: Denies Verbal Abuse: Denies Sexual Abuse: Denies Exploitation of patient/patient's resources: Denies Self-Neglect: Denies  Emotional Status Pt's affect, behavior and adjustment status: Pt reports that she feels much better emotionally and physically.  She is very relieved that her prognosis is as good as it is, knowing things could have been very different. Recent Psychosocial Issues: Pt takes care of her grandchildren and is eager to get back to them.   Psychiatric History: none reported Substance Abuse History: none reported  Patient / Family Perceptions, Expectations & Goals Pt/Family understanding of illness & functional limitations: Pt/son report a good understanding of pt's condition and son is quite relieved things are working out the way they are. Premorbid pt/family roles/activities: Pt cared for her grandchildren and did yoga. Anticipated changes in roles/activities/participation: Pt hopes to return to these activities soon. Pt/family expectations/goals: Pt would "like to work on higher level cognition."  [employer]: None Premorbid Home Care/DME Agencies: None Transportation available at discharge: family Resource referrals recommended: Neuropsychology  Discharge Planning Living Arrangements: Alone Support Systems: Children, Other relatives, Friends/neighbors Type of Residence: Private residence Insurance Resources: Information systems manager (Marine scientist) Museum/gallery curator Resources: SSD Financial Screen Referred: No Money Management: Patient Does the patient have any problems obtaining your medications?:  No Home Management: Pt was taking care of this, but her family will assist. Patient/Family Preliminary Plans: Pt plans to return to  her home and her sister will move in with her for a little bit and a friend from [place] can come to help, too. Barriers to Discharge: Steps Social Work Anticipated Follow Up Needs: HH/OP Expected length of stay: about 7 days   Clinical Impression CSW met with pt and her son, Alexandria [name], to introduce self and role of CSW, as well as to complete assessment.  Pt and son were appreciative, but son did mention that all information and decision making should go through pt's dtr, Alexandria [name].  CSW expressed understanding and left card.  Pt and son are grateful and relieved that pt's prognosis is so good with the abscess.  Pt has good family/friend support and should only need intermittent supervision.  Her sister, friend, and dtr will provide this.  There are no current concerns/needs/questions at this time.  Pt is expected to have a short length of stay with targeted d/c date likely being  11-06-16.  Pt/son told of this and that CSW will confirm this with them on Monday.  CSW will continue to follow and assist as needed.  Alexandria [name], [name] 10/31/2016, 2:19 PM

## 2016-10-31 NOTE — Progress Notes (Signed)
Pt c/o swelling to left forearm, extremity noted to be slightly more edematous than the right with slight color change, and pain to inner forearm but unable to verbalize type of pain. Called Dr. Letta Pate and rec'd orders, see managed orders. Also instructed to have PICC line evaluate line prior to ABT infusion.  Continue plan of care.

## 2016-10-31 NOTE — Progress Notes (Signed)
Okay to take b/p in right upper extremity per Idelle Jo PA-C.

## 2016-10-31 NOTE — IPOC Note (Signed)
Overall Plan of Care [facility]) Patient Details Name: HOPELYNN GARTLAND MRN: [id number] DOB: [birth year]  Admitting Diagnosis: Brain Abcess  Hospital Problems: Active Problems:   Brain abscess     Functional Problem List: Nursing Edema, Endurance, Pain, Safety  PT Balance, Safety  OT Balance, Safety, Cognition, Motor  SLP Linguistic, Cognition  TR         Basic ADL's: OT Grooming, Bathing, Dressing, Toileting     Advanced  ADL's: OT Simple Meal Preparation     Transfers: PT Bed Mobility, Bed to Chair, Car, Furniture, Floor  OT Toilet, Tub/Shower     Locomotion: PT Ambulation, Stairs     Additional Impairments: OT Fuctional Use of Upper Extremity  SLP Communication expression    TR      Anticipated Outcomes Item Anticipated Outcome  Self Feeding N/A  Swallowing      Basic self-care  Mod I   Toileting  Mod I    Bathroom Transfers Supervision-Mod I   Bowel/Bladder  Pt will be continent of bowel and bladder x 2, LBM 10/30/16  Transfers  modI  Locomotion  modI  Communication  Min A  Cognition  Min A  Pain  less than 3  Safety/Judgment  pt will be free from falls   Therapy Plan: PT Intensity: Minimum of 1-2 x/day ,45 to 90 minutes PT Frequency: 5 out of 7 days PT Duration Estimated Length of Stay: 7 days OT Intensity: Minimum of 1-2 x/day, 45 to 90 minutes OT Frequency: 5 out of 7 days OT Duration/Estimated Length of Stay: 7 days SLP Intensity: Minumum of 1-2 x/day, 30 to 90 minutes SLP Frequency: 3 to 5 out of 7 days SLP Duration/Estimated Length of Stay: 5 to 7 days       Team Interventions: Nursing Interventions Patient/Family Education, Disease Management/Prevention, Pain Management, Skin Care/Wound Management, Discharge Planning  PT interventions Ambulation/gait training, Balance/vestibular training, Discharge planning, Disease management/prevention, Neuromuscular re-education, Functional mobility training, Psychosocial support, Therapeutic  Exercise, Therapeutic Activities, UE/LE Strength taining/ROM, DME/adaptive equipment instruction, Cognitive remediation/compensation, Community reintegration, Barrister's clerk education, IT trainer, UE/LE Coordination activities  OT Interventions Discharge planning, Self Care/advanced ADL retraining, Therapeutic Activities, UE/LE Coordination activities, Functional mobility training, Cognitive remediation/compensation, Therapeutic Exercise, Neuromuscular re-education, Psychosocial support, UE/LE Strength taining/ROM  SLP Interventions Functional tasks, Speech/Language facilitation, Cognitive remediation/compensation  TR Interventions    SW/CM Interventions Discharge Planning, Psychosocial Support, Patient/Family Education    Team Discharge Planning: Destination: PT-Home ,OT- Home , SLP-Home Projected Follow-up: PT-Outpatient PT, OT-  24 hour supervision/assistance, SLP-Outpatient SLP Projected Equipment Needs: PT-None recommended by PT, OT- To be determined, SLP-None recommended by SLP Equipment Details: PT- , OT-  Patient/family involved in discharge planning: PT- Patient, Family member/caregiver,  OT-Patient, Family member/caregiver, SLP-Patient, Family member/caregiver  MD ELOS: 7 days Medical Rehab Prognosis:  Excellent Assessment: The patient has been admitted for CIR therapies with the diagnosis of brain abscess s/p craniotomy. The team will be addressing functional mobility, strength, stamina, balance, safety, adaptive techniques and equipment, self-care, bowel and bladder mgt, patient and caregiver education, NMR, cognition, language, community reintegration, ego support. Goals have been set at mod I for mobility, self-care, and cognition/language.    Meredith Staggers, MD, FAAPMR      See Team Conference Notes for weekly updates to the plan of care

## 2016-10-31 NOTE — H&P (Signed)
Physical Medicine and Rehabilitation Admission H&P    No chief complaint on file. : HPI: Alexandria [name] is a 71 y.o. right handed female with history of hypertension, remote history of breast cancer 1996. Per chart review patient lives alone and was independent prior to admission. 2 level home with bedroom on main level. Family and area question 24-hour assist. Presented 10/22/2016 with altered mental status that progressed over the past few days. CT imaging demonstrated a masslike lesion in the left frontal lobe with surrounding vasogenic edema and localized mass effect question malignancy versus abscess. CT of abdomen and pelvis as well as CT of chest negative for acute findings. Neurosurgery consulted and underwent left craniotomy tumor excision 10/27/2016 per Dr. Vertell Limber. Infectious disease consulted with blood cultures showing Streptococcus intermedius and presently on Intravenous ceftriaxone and oral metronidazole anticipate 6 weeks of antibiotic care.Decadron protocol as directed. Keppra added for seizure prophylaxis. Tolerating a regular diet. Physical and occupational therapy evaluation completed with recommendations of physical medicine rehabilitation consult. Patient was admitted for a comprehensive rehabilitation program  Review of Systems  Constitutional: Positive for malaise/fatigue. Negative for chills and fever.  HENT: Negative for hearing loss.   Eyes: Negative for blurred vision and double vision.  Respiratory: Negative for cough and shortness of breath.   Cardiovascular: Negative for chest pain, palpitations and leg swelling.  Gastrointestinal: Positive for constipation. Negative for nausea and vomiting.  Genitourinary: Negative for dysuria, flank pain and hematuria.  Musculoskeletal: Positive for myalgias. Negative for falls.  Skin: Negative for rash.  Neurological: Positive for dizziness and headaches. Negative for seizures and loss of consciousness.    Psychiatric/Behavioral: The patient has insomnia.   All other systems reviewed and are negative.  Past Medical History:  Diagnosis Date  . Cancer [facility]) 1996   breast cx  . Hypertension    Past Surgical History:  Procedure Laterality Date  . ABDOMINAL HYSTERECTOMY    . APPLICATION OF CRANIAL NAVIGATION N/A 10/27/2016   Procedure: APPLICATION OF CRANIAL NAVIGATION;  Surgeon: Erline Levine, MD;  Location: [facility];  Service: Neurosurgery;  Laterality: N/A;  . CRANIOTOMY Left 10/27/2016   Procedure: CRANIOTOMY TUMOR EXCISION;  Surgeon: Erline Levine, MD;  Location: [facility];  Service: Neurosurgery;  Laterality: Left;  Marland Kitchen MASTECTOMY     right breast   Family History  Problem Relation Age of Onset  . Breast cancer Mother   . Colon cancer Father    Social History:  reports that she has never smoked. She has never used smokeless tobacco. She reports that she drinks alcohol. She reports that she does not use drugs. Allergies:  Allergies  Allergen Reactions  . Sulfa Antibiotics Other (See Comments)   Medications Prior to Admission  Medication Sig Dispense Refill  . ALPRAZolam (XANAX) 0.25 MG tablet Take 0.125-0.25 mg by mouth at bedtime as needed for anxiety.     . benazepril (LOTENSIN) 40 MG tablet Take 1 tablet (40 mg total) by mouth daily. 30 tablet 1  . cefTRIAXone (ROCEPHIN) IVPB Inject 2 g into the vein every 12 (twelve) hours. Indication:  Brain abscess Last Day of Therapy:  12/08/16 Labs - Once weekly:  CBC/Diff and BMP Labs - Every other week:  ESR and CRP 80 Units 0  . cefTRIAXone 2 g in dextrose 5 % 50 mL Inject 2 g into the vein every 12 (twelve) hours. 1 ampule 0  . dexamethasone (DECADRON) 4 MG tablet 4 mg twice a day for 3 days 4 mg daily  for 3 days then discontinue 1 tablet 0  . hydrochlorothiazide (HYDRODIURIL) 25 MG tablet Take 1 tablet (25 mg total) by mouth daily. 30 tablet 1  . HYDROcodone-acetaminophen (NORCO/VICODIN) 5-325 MG tablet Take 1 tablet by mouth every 4 (four)  hours as needed for moderate pain. 10 tablet 0  . levETIRAcetam (KEPPRA) 500 MG tablet Take 1 tablet (500 mg total) by mouth 2 (two) times daily. 60 tablet 1  . metoprolol succinate (TOPROL-XL) 100 MG 24 hr tablet Take 100 mg by mouth every evening.     . metroNIDAZOLE (FLAGYL) 500 MG tablet Take 1 tablet (500 mg total) by mouth every 8 (eight) hours. 90 tablet 1  . pantoprazole (PROTONIX) 40 MG tablet Take 1 tablet (40 mg total) by mouth daily. 30 tablet 1  . saccharomyces boulardii (FLORASTOR) 250 MG capsule Take 1 capsule (250 mg total) by mouth 2 (two) times daily. 60 capsule 0    Home: Home Living Family/patient expects to be discharged to:: Private residence Living Arrangements: Alone   Functional History:    Functional Status:  Mobility:          ADL:    Mobility Bed Mobility General bed mobility comments: In chair upon entry   Transfers Overall transfer level: Needs assistance Equipment used: None Transfers: Sit to/from Stand Sit to Stand: Min assist General transfer comment: Min assist due to vc for safety. Pt demonstrating impulsivity with transfers and required multiple safety cues from OT to wait to perform transfers due to IV lines or setting up the next evaluation task.    Balance Overall balance assessment: Needs assistance Sitting-balance support: No upper extremity supported;Feet supported Sitting balance-Leahy Scale: Good Standing balance support: No upper extremity supported;During functional activity Standing balance-Leahy Scale: Fair Standing balance comment: leans against sink during grooming for balance     Cognition: Cognition Orientation Level: Oriented X4    Physical Exam: Blood pressure (!) 168/64, pulse 78, temperature 98.9 F (37.2 C), temperature source Oral, resp. rate 20, height _0  (1.575 m), weight 74.3 kg (163 lb 12.8 oz), SpO2 98 %. Physical Exam  Vitals reviewed. HENT:  Craniotomy site clean and dry  Eyes: EOM are  normal. Left eye exhibits no discharge.  Neck: Normal range of motion. Neck supple. No thyromegaly present.  Cardiovascular: Normal rate, regular rhythm and normal heart sounds.   Respiratory: Effort normal and breath sounds normal. No respiratory distress.  GI: Soft. Bowel sounds are normal. She exhibits no distension.  Neurological: She is alert.  Makes good eye contact with examiner. Follows simple commands. She was able to provide her age but needed multiple cues for date of birth. She does exhibit some decrease in recall of recent events.  Motor strength is 5/5 bilateral deltoid, bicep, tricep grip. 4/5. Bilateral hip flexor, knee extensor, ankle dorsiflexor. She remembers 2 out of 3 unrelated objects after 2 minute delay. Affect is inappropriate. No evidence of lability or agitation    Results for orders placed or performed during the hospital encounter of 10/30/16 (from the past 48 hour(s))  Glucose, capillary     Status: Abnormal   Collection Time: 10/30/16 10:02 PM  Result Value Ref Range   Glucose-Capillary 100 (H) 65 - 99 mg/dL  CBC WITH DIFFERENTIAL     Status: None   Collection Time: 10/31/16  4:52 AM  Result Value Ref Range   WBC 6.8 4.0 - 10.5 K/uL   RBC 4.43 3.87 - 5.11 MIL/uL   Hemoglobin 12.8 12.0 - 15.0  g/dL   HCT 39.2 36.0 - 46.0 %   MCV 88.5 78.0 - 100.0 fL   MCH 28.9 26.0 - 34.0 pg   MCHC 32.7 30.0 - 36.0 g/dL   RDW 13.1 11.5 - 15.5 %   Platelets 165 150 - 400 K/uL   Neutrophils Relative % 79 %   Neutro Abs 5.4 1.7 - 7.7 K/uL   Lymphocytes Relative 11 %   Lymphs Abs 0.8 0.7 - 4.0 K/uL   Monocytes Relative 9 %   Monocytes Absolute 0.6 0.1 - 1.0 K/uL   Eosinophils Relative 1 %   Eosinophils Absolute 0.1 0.0 - 0.7 K/uL   Basophils Relative 0 %   Basophils Absolute 0.0 0.0 - 0.1 K/uL  Comprehensive metabolic panel     Status: Abnormal   Collection Time: 10/31/16  4:52 AM  Result Value Ref Range   Sodium 139 135 - 145 mmol/L   Potassium 3.5 3.5 - 5.1  mmol/L   Chloride 102 101 - 111 mmol/L   CO2 27 22 - 32 mmol/L   Glucose, Bld 120 (H) 65 - 99 mg/dL   BUN 12 6 - 20 mg/dL   Creatinine, Ser 0.80 0.44 - 1.00 mg/dL   Calcium 8.9 8.9 - 10.3 mg/dL   Total Protein 5.2 (L) 6.5 - 8.1 g/dL   Albumin 2.8 (L) 3.5 - 5.0 g/dL   AST 16 15 - 41 U/L   ALT 23 14 - 54 U/L   Alkaline Phosphatase 42 38 - 126 U/L   Total Bilirubin 0.7 0.3 - 1.2 mg/dL   GFR calc non Af Amer >60 >60 mL/min   GFR calc Af Amer >60 >60 mL/min    Comment: (NOTE) The eGFR has been calculated using the CKD EPI equation. This calculation has not been validated in all clinical situations. eGFR's persistently <60 mL/min signify possible Chronic Kidney Disease.    Anion gap 10 5 - 15   No results found.     Medical Problem List and Plan: 1.  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  DVT Prophylaxis/Anticoagulation: SCDs. Patient is ambulatory 3. Pain Management: Hydrocodone as needed 4. Mood: Xanax 0.125-0.25 mg 2 at bedtime as needed 5. Neuropsych: This patient is capable of making decisions on her own behalf. 6. Skin/Wound Care: Routine skin checks 7. Fluids/Electrolytes/Nutrition: Routine eye and nose with follow-up chemistries 8. ID/Streptococcus intermedius. Intravenous ceftriaxone and oral Flagyl 6 weeks ending 12/08/2016 and follow-up per infectious disease  9. Seizure prophylaxis. Keppra 500 mg twice a day 10. Hyperglycemia secondary to Decadron. Monitor with decrease in steroids 11. Hypertension. Toprol-XL 100 mg daily, Lotensin 20 mg daily, HCTZ 25 mg daily 12.. Constipation. Laxative assistance    Post Admission Physician Evaluation: 1. Functional deficits secondary  to Left frontal lobe abscess. 2. Patient is admitted to receive collaborative, interdisciplinary care between the physiatrist, rehab nursing staff, and therapy team. 3. Patient's level of medical complexity and substantial therapy  needs in context of that medical necessity cannot be provided at a lesser intensity of care such as a SNF. 4. Patient has experienced substantial functional loss from his/her baseline which was documented above under the "Functional History" and "Functional Status" headings.  Judging by the patient's diagnosis, physical exam, and functional history, the patient has potential for functional progress which will result in measurable gains while on inpatient rehab.  These gains will be of substantial and practical use upon discharge  in facilitating mobility and self-care  at the household level. 5. Physiatrist will provide 24 hour management of medical needs as well as oversight of the therapy plan/treatment and provide guidance as appropriate regarding the interaction of the two. 6. The Preadmission Screening has been reviewed and patient status is unchanged unless otherwise stated above. 7. 24 hour rehab nursing will assist with bladder management, bowel management, safety, skin/wound care, disease management, medication administration, pain management and patient education  and help integrate therapy concepts, techniques,education, etc. 8. PT will assess and treat for/with: pre gait, gait training, endurance , safety, equipment, neuromuscular re education.   Goals are: sup/modI. 9. OT will assess and treat for/with: ADLs, Cognitive perceptual skills, Neuromuscular re education, safety, endurance, equipment.   Goals are: Sup/modI. Therapy may proceed with showering this patient. 10. SLP will assess and treat for/with: cognition, language.  Goals are: Mod I med management except for IV abx. 11. Case Management and Social Worker will assess and treat for psychological issues and discharge planning. 12. Team conference will be held weekly to assess progress toward goals and to determine barriers to discharge. 13. Patient will receive at least 3 hours of therapy per day at least 5 days per week. 14. ELOS: 7d        15. Prognosis:  excellent     DAN ANGIULLI PA-C Charlett Blake, MD 10/31/2016

## 2016-10-31 NOTE — Evaluation (Signed)
Physical Therapy Assessment and Plan  Patient Details  Name: Alexandria [name] MRN: [id number] Date of Birth: 17-Nov-1945  PT Diagnosis: Abnormality of gait, Cognitive deficits, Difficulty walking and Impaired cognition Rehab Potential: Good ELOS: 7 days   Today's Date: 10/31/2016 PT Individual Time: 0900-1000 PT Individual Time Calculation (min): 60 min    Problem List:  Patient Active Problem List   Diagnosis Date Noted  . Brain tumor ([place])   . Hyperglycemia 10/27/2016  . Acute encephalopathy 10/23/2016  . Hx of breast cancer 10/23/2016  . Essential hypertension 10/23/2016  . Hypokalemia 10/23/2016  . 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 Cancer [facility]) 1996   breast cx  . Hypertension    Past Surgical History:  Past Surgical History:  Procedure Laterality Date  . ABDOMINAL HYSTERECTOMY    . APPLICATION OF CRANIAL NAVIGATION N/A 10/27/2016   Procedure: APPLICATION OF CRANIAL NAVIGATION;  Surgeon: Erline Levine, MD;  Location: [facility];  Service: Neurosurgery;  Laterality: N/A;  . CRANIOTOMY Left 10/27/2016   Procedure: CRANIOTOMY TUMOR EXCISION;  Surgeon: Erline Levine, MD;  Location: [facility];  Service: Neurosurgery;  Laterality: Left;  Marland Kitchen MASTECTOMY     right breast    Assessment & Plan Clinical Impression: Alexandria Jenning Revingtonis a 71 y.o.right handed femalewith history of hypertension, remote history of breast cancer 1996. Per chart review patient lives alone and was independent prior to admission. 2 level home with bedroom on main level. Family inarea toassist. Presented 10/22/2016 with altered mental status that progressed over the past few days. CT imaging demonstrated a mass like lesion in the left frontal lobe with surrounding vasogenic edema and localized mass effect question malignancy versus abscess. CT of abdomen and pelvis as well as CT of chest negative for acute findings. Neurosurgery consulted and underwent  left craniotomy tumor excision 10/27/2016 per Dr. Vertell Limber. Infectious disease consulted with blood cultures showing Streptococcus intermediusand presently on Intravenous ceftriaxone and oral metronidazoleanticipate 6 weeks of antibiotic care. Decadron protocol as directed.Keppra added for seizure prophylaxis.Tolerating a regular diet. Physical and occupational therapy evaluation completed with recommendations of physical medicine rehabilitation consult.Patient was admitted for a comprehensive rehabilitation program. Patient transferred to CIR on 10/30/2016 .   Patient currently requires supervision with mobility secondary to muscle weakness, decreased safety awareness, decreased memory and delayed processing and decreased standing balance, decreased postural control and decreased balance strategies.  Prior to hospitalization, patient was independent  with mobility and lived with Alone in a House home.  Home access is 1Other (comment) (threshold).  Patient will benefit from skilled PT intervention to maximize safe functional mobility, minimize fall risk and decrease caregiver burden for planned discharge home with intermittent assist.  Anticipate patient will benefit from follow up OP at discharge.  PT - End of Session Activity Tolerance: Endurance does not limit participation in activity Endurance Deficit: No PT Assessment Rehab Potential (ACUTE/IP ONLY): Good Barriers to Discharge: Decreased caregiver support PT Patient demonstrates impairments in the following area(s): Balance;Safety PT Transfers Functional Problem(s): Bed Mobility;Bed to Chair;Car;Furniture;Floor PT Locomotion Functional Problem(s): Ambulation;Stairs PT Plan PT Intensity: Minimum of 1-2 x/day ,45 to 90 minutes PT Frequency: 5 out of 7 days PT Duration Estimated Length of Stay: 7 days PT Treatment/Interventions: Ambulation/gait training;Balance/vestibular training;Discharge planning;Disease management/prevention;Neuromuscular  re-education;Functional mobility training;Psychosocial support;Therapeutic Exercise;Therapeutic Activities;UE/LE Strength taining/ROM;DME/adaptive equipment instruction;Cognitive remediation/compensation;Community reintegration;Patient/family education;Stair training;UE/LE Coordination activities PT Transfers Anticipated Outcome(s): modI PT Locomotion Anticipated Outcome(s): modI PT Recommendation Recommendations for Other  Services: Therapeutic Recreation consult Therapeutic Recreation Interventions: Pet therapy;Kitchen group;Outing/community reintergration Follow Up Recommendations: Outpatient PT Patient destination: Home Equipment Recommended: None recommended by PT  Skilled Therapeutic Intervention Pt received seated in recliner with son-in-law present; denies pain and agreeable to treatment. PT initial evaluation performed and completed with min guard to close S overall, including shower. Pt reporting her goals for therapy are "to leave here, and work on my higher level thinking skills"; per pt and family she is having memory impairments, and aphasia. Pt with higher level balance impairments, occasionally staggering to R side while ambulating but able to catch herself without assist. Educated pt and family, including pt's sister who arrived during session, on rehab process, goals, estimated length of stay, and falls prevention safety. Remained seated in recliner at end of session, family present and all needs in reach.   PT Evaluation Precautions/Restrictions Precautions Precautions: Fall Restrictions Weight Bearing Restrictions: No General Chart Reviewed: Yes Response to Previous Treatment: Not applicable Family/Caregiver Present: Yes  Pain  Denies pain Home Living/Prior Functioning Home Living Available Help at Discharge: Family;Friend(s);Available 24 hours/day Type of Home: House  Lives With: Alone Prior Function Vocation: Retired Vision/Perception  Perception Comments: WFL   Cognition Overall Cognitive Status: Impaired/Different from baseline Arousal/Alertness: Awake/alert Orientation Level: Oriented X4 Attention: Selective Selective Attention: Appears intact Memory: Impaired Memory Impairment: Decreased recall of new information Awareness: Appears intact Problem Solving: Impaired Problem Solving Impairment: Verbal complex;Functional complex Safety/Judgment: Appears intact Sensation Sensation Light Touch: Appears Intact Proprioception: Appears Intact Coordination Gross Motor Movements are Fluid and Coordinated: Yes Fine Motor Movements are Fluid and Coordinated: Yes Heel Shin Test: [facility] Motor  Motor Motor: Within Functional Limits  Mobility Bed Mobility Bed Mobility: Supine to Sit;Sit to Supine Supine to Sit: 5: Supervision Supine to Sit Details: Verbal cues for precautions/safety Sit to Supine: 5: Supervision Sit to Supine - Details: Verbal cues for precautions/safety Transfers Transfers: Yes Sit to Stand: 5: Supervision Sit to Stand Details: Verbal cues for precautions/safety Stand Pivot Transfers: 5: Supervision Stand Pivot Transfer Details: Verbal cues for precautions/safety Locomotion  Ambulation Ambulation: Yes Ambulation/Gait Assistance: 5: Supervision;4: Min guard Ambulation Distance (Feet): 175 Feet Assistive device: None Ambulation/Gait Assistance Details: Verbal cues for precautions/safety Gait Gait: Yes Gait Pattern: Impaired Gait Pattern:  (occasional staggering to R side) Gait velocity: decreased for age norms High Level Ambulation High Level Ambulation: Head turns Head Turns: S overall, no major LOB Stairs / Additional Locomotion Stairs: Yes Stairs Assistance: 5: Supervision Stairs Assistance Details: Verbal cues for precautions/safety Stair Management Technique: Two rails;Alternating pattern;Forwards Number of Stairs: 12 Ramp: 5: Supervision Curb: 5: Supervision Wheelchair Mobility Wheelchair Mobility: No   Trunk/Postural Assessment  Cervical Assessment Cervical Assessment: Within Functional Limits Thoracic Assessment Thoracic Assessment: Within Functional Limits Lumbar Assessment Lumbar Assessment: Within Functional Limits Postural Control Postural Control: Deficits on evaluation (delayed stepping/righting strategies)  Balance Balance Balance Assessed: Yes Standardized Balance Assessment Standardized Balance Assessment: Berg Balance Test;Dynamic Gait Index Berg Balance Test Sit to Stand: Able to stand without using hands and stabilize independently Standing Unsupported: Able to stand safely 2 minutes Sitting with Back Unsupported but Feet Supported on Floor or Stool: Able to sit safely and securely 2 minutes Stand to Sit: Sits safely with minimal use of hands Transfers: Able to transfer safely, minor use of hands Standing Unsupported with Eyes Closed: Able to stand 10 seconds safely Standing Ubsupported with Feet Together: Able to place feet together independently and stand 1 minute safely From Standing, Reach Forward with Outstretched  Arm: Can reach confidently >25 cm (10") From Standing Position, Pick up Object from Floor: Able to pick up shoe safely and easily From Standing Position, Turn to Look Behind Over each Shoulder: Looks behind from both sides and weight shifts well Turn 360 Degrees: Able to turn 360 degrees safely in 4 seconds or less Standing Unsupported, Alternately Place Feet on Step/Stool: Able to complete >2 steps/needs minimal assist Standing Unsupported, One Foot in Front: Able to plae foot ahead of the other independently and hold 30 seconds Standing on One Leg: Tries to lift leg/unable to hold 3 seconds but remains standing independently Total Score: 49 Static Sitting Balance Static Sitting - Balance Support: No upper extremity supported;Feet supported Static Sitting - Level of Assistance: 6: Modified independent (Device/Increase time) Dynamic Sitting  Balance Dynamic Sitting - Balance Support: Feet supported;No upper extremity supported Dynamic Sitting - Level of Assistance: 5: Stand by assistance Dynamic Sitting - Balance Activities: Lateral lean/weight shifting;Forward lean/weight shifting;Reaching for objects;Reaching across midline Static Standing Balance Static Standing - Balance Support: No upper extremity supported Static Standing - Level of Assistance: 5: Stand by assistance Dynamic Standing Balance Dynamic Standing - Balance Support: No upper extremity supported Dynamic Standing - Level of Assistance: 5: Stand by assistance Dynamic Standing - Balance Activities: Lateral lean/weight shifting;Forward lean/weight shifting;Reaching for objects;Reaching across midline Extremity Assessment  RUE Assessment RUE Assessment: Within Functional Limits LUE Assessment LUE Assessment: Within Functional Limits RLE Assessment RLE Assessment: Within Functional Limits (grossly 4+/5 throughout) LLE Assessment LLE Assessment: Within Functional Limits (grossly 4+/5 throughout)   See Function Navigator for Current Functional Status.   Refer to Care Plan for Long Term Goals  Recommendations for other services: Therapeutic Recreation  Pet therapy, Kitchen group, Stress management and Outing/community reintegration  Discharge Criteria: Patient will be discharged from PT if patient refuses treatment 3 consecutive times without medical reason, if treatment goals not met, if there is a change in medical status, if patient makes no progress towards goals or if patient is discharged from hospital.  The above assessment, treatment plan, treatment alternatives and goals were discussed and mutually agreed upon: by patient  Luberta Mutter 10/31/2016, 7:33 AM

## 2016-10-31 NOTE — Progress Notes (Signed)
Pt arrived to floor approx 1745, pt A&O with family at bedside, pt able to verbalize needs, no c/o at this time.

## 2016-10-31 NOTE — Progress Notes (Signed)
Physical Therapy Session Note  Patient Details  Name: Alexandria [name] MRN: [id number] Date of Birth: 1945/10/02  Today's Date: 10/31/2016 PT Individual Time: 1400-1430 PT Individual Time Calculation (min): 30 min   Short Term Goals: Week 1:  PT Short Term Goal 1 (Week 1): =LTG due to estimated LOS  Skilled Therapeutic Interventions/Progress Updates:   Session focused on functional gait without AD down to therapy gym with close supervision to steadying assist due to mild LOBs, administered DGI (see below for results and discussed results with patient and family), dynamic standing balance without UE support while on compliant surface while tossing and catching a ball (steady assist for balance), and dynamic gait with dual task activities in hallway with supervision to steadying assist. Handoff to OT for next session.  Therapy Documentation Precautions:  Precautions Precautions: Fall Restrictions Weight Bearing Restrictions: No  Pain:  No complaints.   Balance: Standardized Balance Assessment Standardized Balance Assessment: Dynamic Gait Index Dynamic Gait Index Level Surface: Mild Impairment Change in Gait Speed: Mild Impairment Gait with Horizontal Head Turns: Mild Impairment Gait with Vertical Head Turns: Moderate Impairment Gait and Pivot Turn: Mild Impairment Step Over Obstacle: Mild Impairment Step Around Obstacles: Mild Impairment Steps: Mild Impairment Total Score: 15   See Function Navigator for Current Functional Status.   Therapy/Group: Individual Therapy  Canary Brim Ivory Broad, PT, DPT  10/31/2016, 2:40 PM

## 2016-10-31 NOTE — Evaluation (Signed)
Occupational Therapy Assessment and Plan  Patient Details  Name: Alexandria [name] MRN: [id number] Date of Birth: 01-Oct-1945  OT Diagnosis: cognitive deficits and muscle weakness (generalized) Rehab Potential: Rehab Potential (ACUTE ONLY): Excellent ELOS: 7 days   Today's Date: 10/31/2016 OT Individual Time: 1430-1535 OT Individual Time Calculation (min): 65 min     Problem List:  Patient Active Problem List   Diagnosis Date Noted  . Brain tumor (Alexandria [name])   . Hyperglycemia 10/27/2016  . Acute encephalopathy 10/23/2016  . Hx of breast cancer 10/23/2016  . Essential hypertension 10/23/2016  . Hypokalemia 10/23/2016  . 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 Cancer [facility]) 1996   breast cx  . Hypertension    Past Surgical History:  Past Surgical History:  Procedure Laterality Date  . ABDOMINAL HYSTERECTOMY    . APPLICATION OF CRANIAL NAVIGATION N/A 10/27/2016   Procedure: APPLICATION OF CRANIAL NAVIGATION;  Surgeon: Erline Levine, MD;  Location: [place];  Service: Neurosurgery;  Laterality: N/A;  . CRANIOTOMY Left 10/27/2016   Procedure: CRANIOTOMY TUMOR EXCISION;  Surgeon: Erline Levine, MD;  Location: [place];  Service: Neurosurgery;  Laterality: Left;  Marland Kitchen MASTECTOMY     right breast    Assessment & Plan Clinical Impression:  Alexandria Sabree Revingtonis a 71 y.o.right handed femalewith history of hypertension, remote history of breast cancer 1996. Per chart review patient lives alone and was independent prior to admission. 2 level home with bedroom on main level. Family and area question 24-hour assist. Presented 10/22/2016 with altered mental status that progressed over the past few days. CT imaging demonstrated a masslike lesion in the left frontal lobe with surrounding vasogenic edema and localized mass effect question malignancy versus abscess. CT of abdomen and pelvis as well as CT of chest negative for acute findings. Neurosurgery  consulted and underwent left craniotomy tumor excision 10/27/2016 per Dr. Vertell Limber. Infectious disease consulted with blood cultures showing Streptococcus intermediusand presently on Intravenous ceftriaxone and oral metronidazole anticipate 6 weeks of antibiotic care.Decadron protocol as directed.Keppra added for seizure prophylaxis.Tolerating a regular diet. Physical and occupational therapy evaluation completed with recommendations of physical medicine rehabilitation consult. Patient was admitted for a comprehensive rehabilitation program  Patient currently requires supervision with basic self-care skills secondary to muscle weakness, decreased cardiorespiratoy endurance, decreased attention, decreased problem solving and decreased memory and decreased standing balance.  Prior to hospitalization, patient could complete BADLs with independent .  Patient will benefit from skilled intervention to increase independence with basic self-care skills prior to discharge home with care partner.  Anticipate patient will require intermittent supervision and no further OT follow recommended.  OT - End of Session Endurance Deficit: No OT Assessment Rehab Potential (ACUTE ONLY): Excellent Barriers to Discharge:  (N/A) OT Patient demonstrates impairments in the following area(s): Balance;Safety;Cognition;Motor OT Basic ADL's Functional Problem(s): Grooming;Bathing;Dressing;Toileting OT Advanced ADL's Functional Problem(s): Simple Meal Preparation OT Transfers Functional Problem(s): Toilet;Tub/Shower OT Additional Impairment(s): Fuctional Use of Upper Extremity OT Plan OT Intensity: Minimum of 1-2 x/day, 45 to 90 minutes OT Frequency: 5 out of 7 days OT Duration/Estimated Length of Stay: 7 days OT Treatment/Interventions: Discharge planning;Self Care/advanced ADL retraining;Therapeutic Activities;UE/LE Coordination activities;Functional mobility training;Cognitive remediation/compensation;Therapeutic  Exercise;Neuromuscular re-education;Psychosocial support;UE/LE Strength taining/ROM OT Self Feeding Anticipated Outcome(s): N/A OT Basic Self-Care Anticipated Outcome(s): Mod I  OT Toileting Anticipated Outcome(s): Mod I  OT Bathroom Transfers Anticipated Outcome(s): Supervision-Mod I  OT Recommendation Recommendations for Other Services: Speech consult;Therapeutic Recreation consult Therapeutic  Recreation Interventions: Pet therapy;Kitchen group;Outing/community reintergration Patient destination: Home Follow Up Recommendations: 24 hour supervision/assistance Equipment Recommended: To be determined   Skilled Therapeutic Intervention Skilled OT session completed with focus on initial evaluation, education regarding OT role/POC, and family education. Pt was sitting in recliner with family present at time of arrival, agreeable to tx. Due to pt already completing ADLs earlier with PT, had her practice simulated walk-in shower and toilet transfers. She completed these without use of grab bars and thresholds emulating home environment. She required  min guard-supervision without AD. We practiced IADL tasks; She engaged in simulated meal prep with higher level cognitive demands (making unrelated food items symbolize routine food items and finding them in cupboards/drawers). Simulated grocery shopping task completed with pt ambulating down hallway with grocery cart, retrieving 3 specific items from [place] recall. Pt with difficultly dual-task processing, would often lose focus when engaging in conversation with therapist, and required cues to recall task she was previously doing. She "bought" specified items with paper money, required mod cues to complete accurately. Discussed how this relates to money mgt at home. We then returned to her room. She walked past it while engaged in conversation with therapist. Cues for redirection. Family was trained with toilet transfers and signed off on safety plan. Pt was left  with family in recliner with all needs at time of departure.   OT Evaluation Precautions/Restrictions  Precautions Precautions: Fall Restrictions Weight Bearing Restrictions: No General Chart Reviewed: Yes Family/Caregiver Present: Yes Vital Signs  Pain Pain Assessment Pain Assessment: 0-10 Pain Score: 4  Pain Type: Acute pain Pain Location: Head Pain Orientation: Left;Mid Pain Descriptors / Indicators: Headache Pain Intervention(s): Medication (See eMAR) Home Living/Prior Functioning Home Living Family/patient expects to be discharged to:: Private residence Living Arrangements: Alone Available Help at Discharge: Family, Friend(s), Available 24 hours/day Type of Home: House Home Access: Other (comment) (threshold) Entrance Stairs-Number of Steps: 1 Entrance Stairs-Rails: None Home Layout: Two level, Able to live on main level with bedroom/bathroom, Full bath on main level Alternate Level Stairs-Number of Steps: flight Bathroom Shower/Tub: Multimedia programmer: Standard Bathroom Accessibility: Yes  Lives With: Alone (Pts family plan to provide 24/7 at time of d/c) IADL History Homemaking Responsibilities: Yes Meal Prep Responsibility: Primary Laundry Responsibility: Primary Cleaning Responsibility: No Bill Paying/Finance Responsibility: Primary Shopping Responsibility: Primary Child Care Responsibility: No Occupation: Retired Type of Occupation: Radio producer Leisure and Hobbies: Yoga, taking care of grandchildren Prior Function Level of Independence: Independent with basic ADLs, Independent with homemaking with ambulation Driving: Yes Vocation: Retired ADL ADL ADL Comments: Please see functional navigator for ADL status Vision/Perception  Vision- History Baseline Vision/History: No visual deficits Patient Visual Report: No change from baseline Perception Comments: WFL during functional task completion  Cognition Overall Cognitive Status:  Impaired/Different from baseline Arousal/Alertness: Awake/alert Orientation Level: Person;Place;Situation Person: Oriented Place: Oriented Situation: Oriented Year: 2018 Month: March Day of Week: Correct Memory: Impaired Memory Impairment: Decreased recall of new information Immediate Memory Recall: Sock;Blue;Bed Memory Recall: Sock;Blue;Bed Memory Recall Sock: Without Cue Memory Recall Blue: Without Cue Memory Recall Bed: Without Cue Attention: Divided;Alternating Selective Attention: Appears intact Divided Attention: Impaired Awareness: Appears intact Problem Solving: Impaired Problem Solving Impairment: Verbal complex;Functional complex Safety/Judgment: Appears intact Sensation Sensation Light Touch: Appears Intact Stereognosis: Not tested Hot/Cold: Not tested Proprioception: Appears Intact Coordination Gross Motor Movements are Fluid and Coordinated: Yes Fine Motor Movements are Fluid and Coordinated: Yes Finger Nose Finger Test: mild dysmetria Motor  Motor Motor: Hemiplegia Motor - Skilled Clinical  Observations: Mild right sided weakness Mobility  Transfers Transfers: Sit to Stand;Stand to Sit Sit to Stand: 5: Supervision Stand to Sit: 5: Supervision  Trunk/Postural Assessment  Cervical Assessment Cervical Assessment: Within Functional Limits Thoracic Assessment Thoracic Assessment: Within Functional Limits Lumbar Assessment Lumbar Assessment: Within Functional Limits Postural Control Postural Control: Within Functional Limits (while ambulating during IADL tasks)  Balance Balance Balance Assessed: Yes Static Sitting Balance Static Sitting - Balance Support: No upper extremity supported Static Sitting - Level of Assistance: 6: Modified independent (Device/Increase time) Dynamic Sitting Balance Dynamic Sitting - Balance Support: Feet supported;No upper extremity supported Dynamic Sitting - Level of Assistance: 5: Stand by assistance Dynamic Standing  Balance Dynamic Standing - Balance Support: No upper extremity supported Dynamic Standing - Level of Assistance: 5: Stand by assistance Dynamic Standing - Balance Activities:  (while ambulating in kitchen and completing simulated grocery shopping) Extremity/Trunk Assessment RUE Assessment RUE Assessment: Exceptions to [employer] (3+/5 proximal to distal) LUE Assessment LUE Assessment: Within Functional Limits (4/5 proximal to distal)   See Function Navigator for Current Functional Status.   Refer to Care Plan for Long Term Goals  Recommendations for other services: Therapeutic Recreation  Pet therapy, Kitchen group and Outing/community reintegration   Discharge Criteria: Patient will be discharged from OT if patient refuses treatment 3 consecutive times without medical reason, if treatment goals not met, if there is a change in medical status, if patient makes no progress towards goals or if patient is discharged from hospital.  The above assessment, treatment plan, treatment alternatives and goals were discussed and mutually agreed upon: by patient and by family  Skeet Simmer 10/31/2016, 8:35 PM

## 2016-10-31 NOTE — Evaluation (Signed)
Speech Language Pathology Assessment and Plan  Patient Details  Name: Alexandria [name] MRN: [id number] Date of Birth: 13-May-1946  SLP Diagnosis: Aphasia;Cognitive Impairments  Rehab Potential: Excellent ELOS: 5 to 7 days    Today's Date: 10/31/2016 SLP Individual Time: 1000-1100 SLP Individual Time Calculation (min): 60 min   Problem List:  Patient Active Problem List   Diagnosis Date Noted  . Brain tumor ([place])   . Hyperglycemia 10/27/2016  . Acute encephalopathy 10/23/2016  . Hx of breast cancer 10/23/2016  . Essential hypertension 10/23/2016  . Hypokalemia 10/23/2016  . 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 Cancer [facility]) 1996   breast cx  . Hypertension    Past Surgical History:  Past Surgical History:  Procedure Laterality Date  . ABDOMINAL HYSTERECTOMY    . APPLICATION OF CRANIAL NAVIGATION N/A 10/27/2016   Procedure: APPLICATION OF CRANIAL NAVIGATION;  Surgeon: Erline Levine, MD;  Location: [place];  Service: Neurosurgery;  Laterality: N/A;  . CRANIOTOMY Left 10/27/2016   Procedure: CRANIOTOMY TUMOR EXCISION;  Surgeon: Erline Levine, MD;  Location: [place];  Service: Neurosurgery;  Laterality: Left;  Marland Kitchen MASTECTOMY     right breast    Assessment / Plan / Recommendation Clinical Impression Alexandria [name] a 71 y.o.right handed femalewith history of hypertension, remote history of breast cancer 1996. Per chart review patient lives alone and was independent prior to admission. 2 level home with bedroom on main level. Family and area question 24-hour assist. Presented 10/22/2016 with altered mental status that progressed over the past few days. CT imaging demonstrated a masslike lesion in the left frontal lobe with surrounding vasogenic edema and localized mass effect question malignancy versus abscess. CT of abdomen and pelvis as well as CT of chest negative for acute findings. Neurosurgery consulted and underwent  left craniotomy tumor excision 10/27/2016 per Dr. Vertell Limber. Infectious disease consulted with blood cultures showing Streptococcus intermediusand presently on Intravenous ceftriaxone and oral metronidazole anticipate 6 weeks of antibiotic care. Decadron protocol as directed.Keppra added for seizure prophylaxis.Tolerating a regular diet. Physical and occupational therapy evaluation completed with recommendations of physical medicine rehabilitation consult. Patient was admitted for a comprehensive rehabilitation program on 10/30/16.   Speech-language evaluation completed on 10/31/16. Pt presents with mild to moderate expressive aphasia c/b word finding errors particularly during unstructured verbal tasks. Pt is aware and attempts to self-correct are effective. Additionally, semantic cues are very effectively in increasing expressive abilities. Pt presents with funcitonal basic cognitive tasks but would benefit from higher level tasks to ready pt for return to independent living. Skilled ST is required to address these areas in order to increase functional independence prior to discharge. Anticipate that pt might benefit from Outpatient ST at time of discharge.    Skilled Therapeutic Interventions          Skilled treatment session focused on completion of speech-language evaluation, see above. Pt required Mod A to Min A semantic cues for increased expressive communication. Pt with good self-awareness and decreased attempts to self-correct, she remained silent instead. Education provided on POC and all questions answered to pt and family's satisfaction.    SLP Assessment  Patient will need skilled [facility] [facility] during CIR admission    Recommendations  Patient destination: Home Follow up Recommendations: Outpatient SLP Equipment Recommended: None recommended by SLP    SLP Frequency 3 to 5 out of 7 days   SLP Duration  SLP Intensity  SLP Treatment/Interventions  5 to 7  days  Minumum of 1-2 x/day, 30 to 90 minutes  Functional tasks;Speech/Language facilitation;Cognitive remediation/compensation    Pain    Prior Functioning Cognitive/Linguistic Baseline: Within functional limits Type of Home: House  Lives With: Alone Available Help at Discharge: Family;Friend(s);Available 24 hours/day Vocation: Retired  Function:  Eating Eating   Modified Consistency Diet: No Eating Assist Level: No help, No cues           Cognition Comprehension Comprehension assist level: Follows basic conversation/direction with no assist  Expression   Expression assist level: Expresses basic 90% of the time/requires cueing < 10% of the time.  Social Interaction Social Interaction assist level: Interacts appropriately with others with medication or extra time (anti-anxiety, antidepressant).  Problem Solving Problem solving assist level: Solves complex 90% of the time/cues < 10% of the time  Memory Memory assist level: More than reasonable amount of time   Short Term Goals: Week 1: SLP Short Term Goal 1 (Week 1): Pt will utilize word finding strategies to name 10 items within requested category with Min A verbal cues.  SLP Short Term Goal 2 (Week 1): Pt will self-monitor and self- correct expressive errors with Min A verbal cues.  SLP Short Term Goal 3 (Week 1): Pt will complete semi-complex problem solving tasks with Min A cues. SLP Short Term Goal 4 (Week 1): Pt will demonstrate alternating attention for ~ 30 minutes with Min A verbal cues.  SLP Short Term Goal 5 (Week 1): Pt will demonstrate anticipatory awareness by stating 3 tasks that she is safe to perform within her home environment.   Refer to Care Plan for Long Term Goals  Recommendations for other services: Neuropsych  Discharge Criteria: Patient will be discharged from SLP if patient refuses treatment 3 consecutive times without medical reason, if treatment goals not met, if there is a change in medical  status, if patient makes no progress towards goals or if patient is discharged from hospital.  The above assessment, treatment plan, treatment alternatives and goals were discussed and mutually agreed upon: by patient and by family   Hether Anselmo B. Rutherford Nail, M.S., CCC-SLP Speech-Language Pathologist   Lorilynn Lehr 10/31/2016, 3:20 PM

## 2016-10-31 NOTE — Progress Notes (Signed)
Patient information reviewed and entered into eRehab system by Jullian Clayson, RN, CRRN, PPS Coordinator.  Information including medical coding and functional independence measure will be reviewed and updated through discharge.     Per nursing patient was given "Data Collection Information Summary for Patients in Inpatient Rehabilitation Facilities with attached "Privacy Act Statement-Health Care Records" upon admission.  

## 2016-10-31 NOTE — Progress Notes (Signed)
[facility] PHYSICAL MEDICINE & REHABILITATION     PROGRESS NOTE    Subjective/Complaints: Up eating breakfast. Denies any problems. Grandson at bedside.   ROS: pt denies nausea, vomiting, diarrhea, cough, shortness of breath or chest pain   Objective: Vital Signs: Blood pressure (!) 162/74, pulse 68, temperature 97.8 F (36.6 C), temperature source Oral, resp. rate 18, height 5\' 2"  (1.575 m), weight 74.3 kg (163 lb 12.8 oz), SpO2 97 %. No results found.  Recent Labs  10/31/16 0452  WBC 6.8  HGB 12.8  HCT 39.2  PLT 165    Recent Labs  10/29/16 0430 10/31/16 0452  NA 139 139  K 3.5 3.5  CL 106 102  GLUCOSE 138* 120*  BUN 16 12  CREATININE 0.74 0.80  CALCIUM 8.4* 8.9   CBG (last 3)   Recent Labs  10/30/16 1616 10/30/16 2202 10/31/16 0650  GLUCAP 117* 100* 100*    Wt Readings from Last 3 Encounters:  10/30/16 74.3 kg (163 lb 12.8 oz)  10/23/16 74.9 kg (165 lb 2 oz)    Physical Exam:  HENT:  Craniotomy site clean and dry with sutures Eyes: EOMI  Neck: Normal range of motion. Neck supple. No thyromegaly present.  Cardiovascular: RRR.   Respiratory: CTA B  GI: Soft. Bowel sounds are normal. She exhibits no distension.  Neurological: She is alert.   Follows simple commands. Able to communicate and carry conversation. Uses shorter sentences and phrases. Speech clear.  Motor strength is 5/5 bilateral deltoid, bicep, tricep grip. 4+/5 Bilateral hip flexor, knee extensor, ankle dorsiflexor. Fair insight and awareness.  Psych: a little flat, but otherwise very appropriate  Assessment/Plan: 1. 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 Physiatrist is providing close team supervision and 24 hour management of active medical problems listed below. Physiatrist and rehab team continue to assess barriers to discharge/monitor  patient progress toward functional and medical goals.  Function:  Bathing Bathing position      Bathing parts      Bathing assist        Upper Body Dressing/Undressing Upper body dressing                    Upper body assist        Lower Body Dressing/Undressing Lower body dressing                                  Lower body assist        Toileting Toileting Toileting activity did not occur: N/A (pt wearing a gown) Toileting steps completed by patient: Adjust clothing prior to toileting, Performs perineal hygiene, Adjust clothing after toileting      Toileting assist Assist level: No help/no cues   Transfers Chair/bed transfer             Locomotion Ambulation           Wheelchair          Cognition Comprehension Comprehension assist level: Understands basic 75 - 89% of the time/ requires cueing 10 - 24% of the time  Expression Expression assist level: Expresses basic 90% of the time/requires cueing < 10% of the time.  Social Interaction Social Interaction assist level: Interacts appropriately with others with medication or extra time (anti-anxiety, antidepressant).  Problem Solving Problem solving assist level: Solves  basic 90% of the time/requires cueing < 10% of the time  Memory Memory assist level: More than reasonable amount of time   Medical Problem List and Plan: 1.  Decreased functional mobility with cognitive deficits secondary to Left frontal lobe brain abscess  -begin CIR therapies--expect short stay 2.  DVT Prophylaxis/Anticoagulation: SCDs. Patient is ambulatory 3. Pain Management: Hydrocodone as needed 4. Mood: Xanax 0.125-0.25 mg 2 at bedtime as needed 5. Neuropsych: This patient is capable of making decisions on her own behalf. 6. Skin/Wound Care: Routine skin checks 7. Fluids/Electrolytes/Nutrition: encourage po  -I personally reviewed the patient's labs today.  8. ID/Streptococcus intermedius. Intravenous  ceftriaxone and oral Flagyl 6 weeks ending 12/08/2016 and follow-up per infectious disease   -wbc's 6.8 9. Seizure prophylaxis. Keppra 500 mg twice a day 10. Hyperglycemia secondary to Decadron. Monitor with decrease in steroids--sugars are normal  -dc cbg's 11. Hypertension. Toprol-XL 100 mg daily, Lotensin 20 mg daily, HCTZ 25 mg daily 12.. Constipation. Laxative assistance   LOS (Days) 1 A FACE TO FACE EVALUATION WAS PERFORMED  Meredith Staggers, MD 10/31/2016 9:51 AM

## 2016-10-31 NOTE — Progress Notes (Signed)
[facility] Individual Statement of Services  Patient Name:  Alexandria [name]  Date:  10/31/2016  Welcome to the [facility].  Our goal is to provide you with an individualized program based on your diagnosis and situation, designed to meet your specific needs.  With this comprehensive rehabilitation program, you will be expected to participate in at least 3 hours of rehabilitation therapies Monday-Friday, with modified therapy programming on the weekends.  Your rehabilitation program will include the following services:  Physical Therapy (PT), Occupational Therapy (OT), Speech Therapy (ST), 24 hour per day rehabilitation nursing, Neuropsychology, Case Management (Social Worker), Rehabilitation Medicine, Nutrition Services and Pharmacy Services  Weekly team conferences will be held on Tuesdays to discuss your progress.  Your Social Worker will talk with you frequently to get your input and to update you on team discussions.  Team conferences with you and your family in attendance may also be held.  Expected length of stay:  7 days  Overall anticipated outcome:  Modified Independent with some intermittent supervision  Depending on your progress and recovery, your program may change. Your Social Worker will coordinate services and will keep you informed of any changes. Your Social Worker's name and contact numbers are listed  below.  The following services may also be recommended but are not provided by the [facility] will be made to provide these services after discharge if needed.  Arrangements include referral to agencies that provide these services.  Your insurance has been verified to be:  [employer] Your primary doctor is:  Dr. Kelton Pillar  Pertinent information will be shared with your doctor  and your insurance company.  Social Worker:  Alfonse Alpers, LCSW  [phone] or [phone]  Information discussed with and copy given to patient by: Trey Sailors, 10/31/2016, 1:47 PM

## 2016-11-01 ENCOUNTER — Inpatient Hospital Stay (HOSPITAL_COMMUNITY): Payer: Medicare Other | Admitting: Speech Pathology

## 2016-11-01 ENCOUNTER — Inpatient Hospital Stay (HOSPITAL_COMMUNITY): Payer: Medicare Other

## 2016-11-01 ENCOUNTER — Inpatient Hospital Stay (HOSPITAL_COMMUNITY): Payer: Medicare Other | Admitting: Occupational Therapy

## 2016-11-01 DIAGNOSIS — G060 Intracranial abscess and granuloma: Secondary | ICD-10-CM

## 2016-11-01 DIAGNOSIS — M7989 Other specified soft tissue disorders: Secondary | ICD-10-CM

## 2016-11-01 DIAGNOSIS — R269 Unspecified abnormalities of gait and mobility: Secondary | ICD-10-CM

## 2016-11-01 LAB — AEROBIC/ANAEROBIC CULTURE W GRAM STAIN (SURGICAL/DEEP WOUND)

## 2016-11-01 LAB — GLUCOSE, CAPILLARY
GLUCOSE-CAPILLARY: 112 mg/dL — AB (ref 65–99)
Glucose-Capillary: 120 mg/dL — ABNORMAL HIGH (ref 65–99)
Glucose-Capillary: 137 mg/dL — ABNORMAL HIGH (ref 65–99)
Glucose-Capillary: 190 mg/dL — ABNORMAL HIGH (ref 65–99)

## 2016-11-01 LAB — AEROBIC/ANAEROBIC CULTURE (SURGICAL/DEEP WOUND)

## 2016-11-01 NOTE — Progress Notes (Signed)
Occupational Therapy Session Note  Patient Details  Name: Alexandria [name] MRN: [id number] Date of Birth: Jul 01, 1946  Today's Date: 11/01/2016 OT Individual Time: 1002-1101 OT Individual Time Calculation (min): 59 min    Short Term Goals: Week 1:  OT Short Term Goal 1 (Week 1): STGs=LTGs due to ELOS  Skilled Therapeutic Interventions/Progress Updates: Skilled OT session completed with focus on ADL retraining, higher level cognitive skills, and dynamic balance. Pt was sitting with son in law at time of arrival, requesting shower. All ADLs completed at supervision level with min cues to complete LB ADLs while seated for safety. She was able to stoop down to retrieve shoes off of floor with close supervision and no LOB. Afterwards had her engage in scavenger hunt throughout unit, hiding items and finding them again. Dual task processing utilized for higher level cognitive demands, asked her to count by multiple of 5s, list zoo animals, vegetables, etc while searching. Pt required min-mod cues to complete task successfully, and pathfind her way through unit. At end of session she found her way back to her room. Discussed how task related functionally when locating items in home. She was left in recliner with family at time of departure.   2nd Session 1:1 tx (34 min) Pt was sitting in recliner with family at time of arrival, agreeable to session. Tx focus on dynamic balance and higher level cognitive skills. Pt ambulated to RN station, able to sign herself off of unit and assess time accurately. She ambulated without AD and supervision to gift shop, had her engage in task of selecting birthday presents for granddaughter while on a budget of $84. She was able to stoop down for items on low shelves and navigate around environmental barriers without LOB. Mod-max cuing for simple math calculations on paper for pricing. Paper money used to simulate Patent examiner. Had her pathfind her way back to unit, able  to utilize maps/staff members for assistance. She required mod cues overall. Pt then ambulated back to room and was left with son at time of departure.      Therapy Documentation Precautions:  Precautions Precautions: Fall Restrictions Weight Bearing Restrictions: No   Pain: No c/o pain during session  Pain Assessment Pain Assessment: No/denies pain ADL: ADL ADL Comments: Please see functional navigator for ADL status     See Function Navigator for Current Functional Status.   Therapy/Group: Individual Therapy  Tericka Devincenzi A Jasman Murri 11/01/2016, 12:46 PM

## 2016-11-01 NOTE — Progress Notes (Signed)
Speech Language Pathology Daily Session Note  Patient Details  Name: Alexandria [name] MRN: [id number] Date of Birth: 05/31/1946  Today's Date: 11/01/2016 SLP Individual Time: 1300-1345 SLP Individual Time Calculation (min): 45 min  Short Term Goals: Week 1: SLP Short Term Goal 1 (Week 1): Pt will utilize word finding strategies to name 10 items within requested category with Min A verbal cues.  SLP Short Term Goal 2 (Week 1): Pt will self-monitor and self- correct expressive errors with Min A verbal cues.  SLP Short Term Goal 3 (Week 1): Pt will complete semi-complex problem solving tasks with Min A cues. SLP Short Term Goal 4 (Week 1): Pt will demonstrate alternating attention for ~ 30 minutes with Min A verbal cues.  SLP Short Term Goal 5 (Week 1): Pt will demonstrate anticipatory awareness by stating 3 tasks that she is safe to perform within her home environment.   Skilled Therapeutic Interventions: Skilled treatment session focused on word-finding goals. SLP facilitated session by providing Min A question cues for utilization of word-finding strategies during a complex verbal description task that focused on abstract thoughts. Patient ambulated to and from her room with supervision for path finding. Patient left upright in recliner with family present. Continue with current plan of care.      Function:  Cognition Comprehension Comprehension assist level: Understands complex 90% of the time/cues 10% of the time  Expression   Expression assist level: Expresses basic 90% of the time/requires cueing < 10% of the time.  Social Interaction Social Interaction assist level: Interacts appropriately with others with medication or extra time (anti-anxiety, antidepressant).  Problem Solving Problem solving assist level: Solves complex 90% of the time/cues < 10% of the time  Memory Memory assist level: More than reasonable amount of time    Pain No/Denies Pain  Therapy/Group: Individual  Therapy  Patra Gherardi 11/01/2016, 3:11 PM

## 2016-11-01 NOTE — Progress Notes (Signed)
Subjective: Patient reports improving daily  Objective: Vital signs in last 24 hours: Temp:  [98.2 F (36.8 C)] 98.2 F (36.8 C) (03/31 0548) Pulse Rate:  [62-88] 62 (03/31 0548) Resp:  [19] 19 (03/31 0548) BP: (127-144)/(86-94) 144/86 (03/31 0548) SpO2:  [98 %-99 %] 99 % (03/31 0548)  Intake/Output from previous day: 03/30 0701 - 03/31 0700 In: 310 [P.O.:300; I.V.:10] Out: -  Intake/Output this shift: Total I/O In: 60 [P.O.:60] Out: -   Physical Exam: Speech much improved: more spontaneous and fluent.  Dressing CDI.  MAEW.  Lab Results:  Recent Labs  10/31/16 0452  WBC 6.8  HGB 12.8  HCT 39.2  PLT 165   BMET  Recent Labs  10/31/16 0452  NA 139  K 3.5  CL 102  CO2 27  GLUCOSE 120*  BUN 12  CREATININE 0.80  CALCIUM 8.9    Studies/Results: No results found.  Assessment/Plan: Patient is improving nicely and enjoying Rehab and therapies.  I discussed Pathology with Dr. Gari Crown yesterday, who was concerned about the extent of plasma cell infiltration along abscess walls and has asked for additional testing (SPEP, UPEP, EBV).  I will order these evaluations.    LOS: 2 days    Peggyann Shoals, MD 11/01/2016, 12:14 PM

## 2016-11-01 NOTE — Progress Notes (Signed)
*  PRELIMINARY RESULTS* Vascular Ultrasound Left upper extremity venous duplex has been completed.  Preliminary findings: Extensive deep vein thrombosis seen in the left upper extremity extending from PICC insertion in the left subclavian vein.    Preliminary results discussed with Margarita Grizzle, Nurse and called to Dr. Letta Pate @ 14:30.  Everrett Coombe 11/01/2016, 2:36 PM

## 2016-11-01 NOTE — Progress Notes (Addendum)
  Spoke with ultrasound tech, positive DVT LUE.  PICC is still functioning  Spoke with Dr Bridgett Larsson, Vascular surg, who stated PICC can still be used and DVT treated with anticoagulation or if contraindicated IR can place IV access  Spoke with Dr Vertell Limber, Neurosurgery, not candidate for anticoagulation  Spoke with Dr Vernard Gambles IR, who can place tunneled IJ cath tomorrow, ok to use PICC tonight for Abx  Orders placed

## 2016-11-01 NOTE — Progress Notes (Addendum)
[facility] PHYSICAL MEDICINE & REHABILITATION     PROGRESS NOTE    Subjective/Complaints: Up in chair, bright affect denies arm pain but RN called last night with concerns about Left hand and forearm swelling  ROS: pt denies nausea, vomiting, diarrhea, cough, shortness of breath or chest pain   Objective: Vital Signs: Blood pressure (!) 144/86, pulse 62, temperature 98.2 F (36.8 C), temperature source Oral, resp. rate 19, height 5\' 2"  (1.575 m), weight 74.3 kg (163 lb 12.8 oz), SpO2 99 %. No results found.  Recent Labs  10/31/16 0452  WBC 6.8  HGB 12.8  HCT 39.2  PLT 165    Recent Labs  10/31/16 0452  NA 139  K 3.5  CL 102  GLUCOSE 120*  BUN 12  CREATININE 0.80  CALCIUM 8.9   CBG (last 3)   Recent Labs  10/31/16 2122 10/31/16 2232 11/01/16 0647  GLUCAP 228* 188* 112*    Wt Readings from Last 3 Encounters:  10/30/16 74.3 kg (163 lb 12.8 oz)  10/23/16 74.9 kg (165 lb 2 oz)    Physical Exam:  HENT:  Craniotomy site clean and dry with sutures Eyes: EOMI  Neck: Normal range of motion. Neck supple. No thyromegaly present.  Cardiovascular: RRR.   Respiratory: CTA B  GI: Soft. Bowel sounds are normal. She exhibits no distension.  Neurological: She is alert.   Follows simple commands. Able to communicate and carry conversation. Uses shorter sentences and phrases. Speech clear.  Motor strength is 5/5 bilateral deltoid, bicep, tricep grip. 4+/5 Bilateral hip flexor, knee extensor, ankle dorsiflexor. Fair insight and awareness.  Psych: a little flat, but otherwise very appropriate EXT- mult ecchymotic areas Left forearm with mild swelling left forearm trace edema dorsum Left hand Assessment/Plan: 1. 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 Physiatrist is providing close team supervision and 24 hour management of active  medical problems listed below. Physiatrist and rehab team continue to assess barriers to discharge/monitor patient progress toward functional and medical goals.  Function:  Bathing Bathing position   Position: Shower  Bathing parts Body parts bathed by patient: Right arm, Left arm, Chest, Abdomen, Front perineal area, Buttocks, Right upper leg, Left upper leg, Right lower leg, Left lower leg, Back    Bathing assist Assist Level: Touching or steadying assistance(Pt > 75%)      Upper Body Dressing/Undressing Upper body dressing   What is the patient wearing?: Pull over shirt/dress, Bra Bra - Perfomed by patient: Thread/unthread right bra strap, Thread/unthread left bra strap, Hook/unhook bra (pull down sports bra)   Pull over shirt/dress - Perfomed by patient: Thread/unthread right sleeve, Thread/unthread left sleeve, Put head through opening, Pull shirt over trunk          Upper body assist Assist Level: Supervision or verbal cues      Lower Body Dressing/Undressing Lower body dressing   What is the patient wearing?: Pants, Shoes     Pants- Performed by patient: Thread/unthread right pants leg, Thread/unthread left pants leg, Pull pants up/down, Fasten/unfasten pants           Shoes - Performed by patient: Don/doff right shoe Shoes - Performed by helper: Don/doff left shoe          Lower body assist Assist for lower body dressing: Touching or steadying assistance (Pt > 75%)      Toileting Toileting Toileting activity did not  occur: N/A (pt wearing a gown) Toileting steps completed by patient: Adjust clothing prior to toileting, Performs perineal hygiene, Adjust clothing after toileting   Toileting Assistive Devices: Grab bar or rail  Toileting assist Assist level: No help/no cues   Transfers Chair/bed transfer   Chair/bed transfer method: Ambulatory Chair/bed transfer assist level: Touching or steadying assistance (Pt > 75%) Chair/bed transfer assistive device:  Armrests     Locomotion Ambulation     Max distance: 175 Assist level: Supervision or verbal cues   Wheelchair          Cognition Comprehension Comprehension assist level: Understands complex 90% of the time/cues 10% of the time  Expression Expression assist level: Expresses basic 90% of the time/requires cueing < 10% of the time.  Social Interaction Social Interaction assist level: Interacts appropriately with others with medication or extra time (anti-anxiety, antidepressant).  Problem Solving Problem solving assist level: Solves complex 90% of the time/cues < 10% of the time  Memory Memory assist level: More than reasonable amount of time   Medical Problem List and Plan: 1.  Decreased functional mobility with cognitive deficits secondary to Left frontal lobe brain abscess  -CIR PT, OT SLP 2.  DVT Prophylaxis/Anticoagulation: SCDs. Patient is ambulatory 3. Pain Management: Hydrocodone as needed 4. Mood: Xanax 0.125-0.25 mg 2 at bedtime as needed 5. Neuropsych: This patient is capable of making decisions on her own behalf. 6. Skin/Wound Care: Routine skin checks, 7. Fluids/Electrolytes/Nutrition: encourage po    8. ID/Streptococcus intermedius. Intravenous ceftriaxone and oral Flagyl 6 weeks ending 12/08/2016 and follow-up per infectious disease   -wbc's 6.8 9. Seizure prophylaxis. Keppra 500 mg twice a day no seizure activity 10. Hyperglycemia secondary to Decadron. Monitor with decrease in steroids--sugars are normal  -dc cbg's 11. Hypertension. Toprol-XL 100 mg daily, Lotensin 20 mg daily, HCTZ 25 mg daily 12.. Constipation. Laxative assistance 13.  Left arm swelling, pt states she did not have hx of axillary dissection on Left, she has multiple ecchymotic areas Left forearm from IVs that were attempted prior to PICC , upper arm soft without swelling, PICC functioning well.  Doppler performed   LOS (Days) 2 A FACE TO FACE EVALUATION WAS PERFORMED  Charlett Blake,  MD 11/01/2016 7:56 AM

## 2016-11-01 NOTE — Progress Notes (Signed)
Physical Therapy Session Note  Patient Details  Name: Alexandria [name] MRN: [id number] Date of Birth: 1945/09/06  Today's Date: 11/01/2016 PT Individual Time: 0800-0900 PT Individual Time Calculation (min): 60 min   Short Term Goals: Week 1:  PT Short Term Goal 1 (Week 1): =LTG due to estimated LOS  Skilled Therapeutic Interventions/Progress Updates:    Focused on functional mobility, balance, and cognitive remediation during morning self-care routine for gathering clothing and dressing, dual task memory activity to locate items from a list in the gym, pathfinding activities in hospital to locate various locations and use maps for guidance, and modified stroop test. Pt required overall supervision for all mobility this session. During dressing, pt with 1 episode of donning bra before removing shirt but able to self -correct. Modified stroop test given in seated, standing, and walking positions to challenge higher level cognition. See results below.   Seated (unsupported):  Word: 7 sec  Color: 18 sec Standing:  Word: 6 sec  Color: 12 sec Walking:  Word: 9 sec  Color: 12 sec  Pt with more difficulty when naming color on card in all positions.   Therapy Documentation Precautions:  Precautions Precautions: Fall Restrictions Weight Bearing Restrictions: No  Pain:  Denies pain .   See Function Navigator for Current Functional Status.   Therapy/Group: Individual Therapy  Alexandria [name], PT, DPT  11/01/2016, 9:13 AM

## 2016-11-02 ENCOUNTER — Inpatient Hospital Stay (HOSPITAL_COMMUNITY): Payer: Medicare Other | Admitting: Occupational Therapy

## 2016-11-02 ENCOUNTER — Inpatient Hospital Stay (HOSPITAL_COMMUNITY): Payer: Medicare Other

## 2016-11-02 ENCOUNTER — Encounter (HOSPITAL_COMMUNITY): Payer: Self-pay | Admitting: Interventional Radiology

## 2016-11-02 DIAGNOSIS — G060 Intracranial abscess and granuloma: Secondary | ICD-10-CM

## 2016-11-02 DIAGNOSIS — R269 Unspecified abnormalities of gait and mobility: Secondary | ICD-10-CM

## 2016-11-02 HISTORY — PX: IR GENERIC HISTORICAL: IMG1180011

## 2016-11-02 LAB — EPSTEIN-BARR VIRUS VCA ANTIBODY PANEL
EBV Early Antigen Ab, IgG: 66.7 U/mL — ABNORMAL HIGH (ref 0.0–8.9)
EBV VCA IGG: 418 U/mL — AB (ref 0.0–17.9)

## 2016-11-02 LAB — GLUCOSE, CAPILLARY
GLUCOSE-CAPILLARY: 120 mg/dL — AB (ref 65–99)
Glucose-Capillary: 115 mg/dL — ABNORMAL HIGH (ref 65–99)
Glucose-Capillary: 185 mg/dL — ABNORMAL HIGH (ref 65–99)
Glucose-Capillary: 176 mg/dL — ABNORMAL HIGH (ref 65–99)

## 2016-11-02 LAB — HIV ANTIBODY (ROUTINE TESTING W REFLEX): HIV SCREEN 4TH GENERATION: NONREACTIVE

## 2016-11-02 MED ORDER — FENTANYL CITRATE (PF) 100 MCG/2ML IJ SOLN
INTRAMUSCULAR | Status: AC
  Filled 2016-11-02: qty 2

## 2016-11-02 MED ORDER — MIDAZOLAM HCL 2 MG/2ML IJ SOLN
INTRAMUSCULAR | Status: AC
  Filled 2016-11-02: qty 2

## 2016-11-02 MED ORDER — LIDOCAINE HCL (PF) 1 % IJ SOLN
INTRAMUSCULAR | Status: AC | PRN
  Administered 2016-11-02: 10 mL

## 2016-11-02 MED ORDER — MIDAZOLAM HCL 2 MG/2ML IJ SOLN
INTRAMUSCULAR | Status: AC | PRN
  Administered 2016-11-02: 1 mg via INTRAVENOUS

## 2016-11-02 MED ORDER — FENTANYL CITRATE (PF) 100 MCG/2ML IJ SOLN
INTRAMUSCULAR | Status: AC | PRN
  Administered 2016-11-02: 50 ug via INTRAVENOUS

## 2016-11-02 MED ORDER — LIDOCAINE HCL (PF) 1 % IJ SOLN
INTRAMUSCULAR | Status: AC
  Filled 2016-11-02: qty 10

## 2016-11-02 NOTE — Sedation Documentation (Signed)
Patient denies pain and is resting comfortably.  

## 2016-11-02 NOTE — Procedures (Signed)
Placement of tunneled R CVC No complication No blood loss. See complete dictation in [facility].

## 2016-11-02 NOTE — Progress Notes (Signed)
Occupational Therapy Session Note  Patient Details  Name: Alexandria [name] MRN: [id number] Date of Birth: Dec 25, 1945  Today's Date: 11/02/2016 OT Missed Time:  30 min Missed Time Reason:  off unit for other medical procedure and then fatigue at follow up   Short Term Goals: Week 1:  OT Short Term Goal 1 (Week 1): STGs=LTGs due to ELOS  Skilled Therapeutic Interventions/Progress Updates:   Initially Pt missed 30 min skilled occupational therapy d/t interventional radiology appointment off unit according to RN. Followed up after lunch, pt requested to move session to end of day d/t fatigue from procedure. Attempted to follow up after therapy at end of day and pt says, "I am just too tired from my last therapy session."   Therapy Documentation Precautions:  Precautions Precautions: Fall Restrictions Weight Bearing Restrictions: No General:   Vital Signs: Therapy Vitals Temp: 97.8 F (36.6 C) Temp Source: Oral Pulse Rate: 73 Resp: 17 BP: 127/68 Patient Position (if appropriate): Sitting Oxygen Therapy SpO2: 98 % O2 Device: Not Delivered Pain:   ADL: ADL ADL Comments: Please see functional navigator for ADL status Exercises:   Other Treatments:    See Function Navigator for Current Functional Status.   Therapy/Group: Individual Therapy  Tonny Branch 11/02/2016, 5:34 PM

## 2016-11-02 NOTE — Progress Notes (Signed)
PHYSICAL MEDICINE & REHABILITATION     PROGRESS NOTE    Subjective/Complaints: Discussed doppler result, inability to anticoagulate, breathing ok ROS: pt denies nausea, vomiting, diarrhea, cough, shortness of breath or chest pain   Objective: Vital Signs: Blood pressure (!) 151/79, pulse 60, temperature 97.7 F (36.5 C), temperature source Oral, resp. rate 18, height 5\' 2"  (1.575 m), weight 74.3 kg (163 lb 12.8 oz), SpO2 96 %. No results found.  Recent Labs  10/31/16 0452  WBC 6.8  HGB 12.8  HCT 39.2  PLT 165    Recent Labs  10/31/16 0452  NA 139  K 3.5  CL 102  GLUCOSE 120*  BUN 12  CREATININE 0.80  CALCIUM 8.9   CBG (last 3)   Recent Labs  11/01/16 1641 11/01/16 2103 11/02/16 0719  GLUCAP 137* 190* 120*    Wt Readings from Last 3 Encounters:  10/30/16 74.3 kg (163 lb 12.8 oz)  10/23/16 74.9 kg (165 lb 2 oz)    Physical Exam:  HENT:  Craniotomy site clean and dry with sutures Eyes: EOMI  Neck: Normal range of motion. Neck supple. No thyromegaly present.  Cardiovascular: RRR.   Respiratory: CTA B  GI: Soft. Bowel sounds are normal. She exhibits no distension.  Neurological: She is alert.   Follows simple commands. Able to communicate and carry conversation. Uses shorter sentences and phrases. Speech clear.  Motor strength is 5/5 bilateral deltoid, bicep, tricep grip. 4+/5 Bilateral hip flexor, knee extensor, ankle dorsiflexor. Fair insight and awareness.  Psych: a little flat, but otherwise very appropriate EXT- mult ecchymotic areas Left forearm with mild swelling left forearm trace edema dorsum Left hand, no pain to palpation Assessment/Plan: 1. 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 Physiatrist is providing close team supervision and 24 hour management of active medical problems listed  below. Physiatrist and rehab team continue to assess barriers to discharge/monitor patient progress toward functional and medical goals.  Function:  Bathing Bathing position   Position: Shower  Bathing parts Body parts bathed by patient: Right arm, Left arm, Chest, Abdomen, Front perineal area, Buttocks, Right upper leg, Left upper leg, Right lower leg, Left lower leg, Back    Bathing assist Assist Level: Supervision or verbal cues      Upper Body Dressing/Undressing Upper body dressing   What is the patient wearing?: Button up shirt, Bra Bra - Perfomed by patient: Thread/unthread right bra strap, Thread/unthread left bra strap, Hook/unhook bra (pull down sports bra)   Pull over shirt/dress - Perfomed by patient: Thread/unthread right sleeve, Thread/unthread left sleeve, Put head through opening, Pull shirt over trunk   Button up shirt - Perfomed by patient: Thread/unthread right sleeve, Thread/unthread left sleeve, Pull shirt around back, Button/unbutton shirt      Upper body assist Assist Level: Supervision or verbal cues      Lower Body Dressing/Undressing Lower body dressing   What is the patient wearing?: Pants, Shoes, Socks, Underwear Underwear - Performed by patient: Thread/unthread right underwear leg, Thread/unthread left underwear leg, Pull underwear up/down   Pants- Performed by patient: Thread/unthread right pants leg, Thread/unthread left pants leg, Pull pants up/down, Fasten/unfasten pants       Socks - Performed by patient: Don/doff right sock, Don/doff left sock   Shoes - Performed by patient: Don/doff right shoe, Don/doff left shoe Shoes - Performed by helper: Don/doff left shoe  Lower body assist Assist for lower body dressing: Supervision or verbal cues      Toileting Toileting Toileting activity did not occur: N/A (pt wearing a gown) Toileting steps completed by patient: Adjust clothing prior to toileting, Performs perineal hygiene, Adjust  clothing after toileting   Toileting Assistive Devices: Grab bar or rail  Toileting assist Assist level: Supervision or verbal cues   Transfers Chair/bed transfer   Chair/bed transfer method: Ambulatory Chair/bed transfer assist level: Supervision or verbal cues Chair/bed transfer assistive device: Armrests     Locomotion Ambulation     Max distance: >1000 Assist level: Supervision or verbal cues   Wheelchair          Cognition Comprehension Comprehension assist level: Understands complex 90% of the time/cues 10% of the time  Expression Expression assist level: Expresses basic 90% of the time/requires cueing < 10% of the time.  Social Interaction Social Interaction assist level: Interacts appropriately with others with medication or extra time (anti-anxiety, antidepressant).  Problem Solving Problem solving assist level: Solves complex 90% of the time/cues < 10% of the time  Memory Memory assist level: More than reasonable amount of time   Medical Problem List and Plan: 1.  Decreased functional mobility with cognitive deficits secondary to Left frontal lobe brain abscess  -CIR PT, OT SLP 2.  DVT Prophylaxis/Anticoagulation: SCDs. Patient is ambulatory 3. Pain Management: Hydrocodone as needed 4. Mood: Xanax 0.125-0.25 mg 2 at bedtime as needed 5. Neuropsych: This patient is capable of making decisions on her own behalf. 6. Skin/Wound Care: Routine skin checks, 7. Fluids/Electrolytes/Nutrition: encourage po    8. ID/Streptococcus intermedius. Intravenous ceftriaxone and oral Flagyl 6 weeks ending 12/08/2016 and follow-up per infectious disease   -wbc's 6.8 9. Seizure prophylaxis. Keppra 500 mg twice a day no seizure activity 10. Hyperglycemia secondary to Decadron. Monitor with decrease in steroids--sugars are normal  -dc cbg's 11. Hypertension. Toprol-XL 100 mg daily, Lotensin 20 mg daily, HCTZ 25 mg daily 12.. Constipation. Laxative assistance 13.  Left arm swelling, pt  states she did not have hx of axillary dissection on Left, she has multiple ecchymotic areas Left forearm from IVs that were attempted prior to PICC , upper arm soft without swelling, PICC functioning well.  Doppler showed DVT to subclavian, needs IV access d/w interventional plan for IJ tunneled cath today.  Not candidate for anticoag per NS  LOS (Days) 3 A FACE TO FACE EVALUATION WAS PERFORMED  Charlett Blake, MD 11/02/2016 7:47 AM

## 2016-11-02 NOTE — Progress Notes (Addendum)
Pt seen and examined.  No new issues other than DVT LUE - already aware Working with rehab. Going well No concerns todaay  EXAM: Temp:  [97.5 F (36.4 C)-97.7 F (36.5 C)] 97.7 F (36.5 C) (04/01 0430) Pulse Rate:  [60-86] 60 (04/01 0430) Resp:  [18-20] 18 (04/01 0430) BP: (136-151)/(77-95) 151/79 (04/01 0430) SpO2:  [95 %-96 %] 96 % (04/01 0430) Intake/Output      03/31 0701 - 04/01 0700 04/01 0701 - 04/02 0700   P.O. 480    I.V. (mL/kg) 10 (0.1)    Total Intake(mL/kg) 490 (6.6)    Net +490          Urine Occurrence 3 x    Stool Occurrence 2 x     Awake and alert Follows commands throughout Full strength  Stable Continue current care

## 2016-11-02 NOTE — Progress Notes (Signed)
Occupational Therapy Session Note  Patient Details  Name: Alexandria [name] MRN: [id number] Date of Birth: 1945/12/27  Today's Date: 11/02/2016 OT Individual Time: 8185-6314 OT Individual Time Calculation (min): 61 min    Short Term Goals: Week 1:  OT Short Term Goal 1 (Week 1): STGs=LTGs due to ELOS  Skilled Therapeutic Interventions/Progress Updates: Skilled OT session completed with focus on dynamic balance and higher level cognitive skills. MD was consulted with in regards to pts L UE DVT and activity restrictions. Per MD, no L UE resistance exercises/massage at this time. Pt/family educated on these restrictions and verbalized understanding. Pt ambulated to outdoor environment with supervision. She participated in [employer] and 1 legged yoga balance poses (a hobby of hers). She used a supportive vertical surface to stabilize balance when transitioning in/out of poses (with min guard). Yoga stretches completed while seated focusing on flexibility, strength, and postural control. Afterwards pt was able to pathfind her way back to the unit with min questioning cues. She participated in dual task IADL activity, involving vacuuming dayroom while stooping to retrieve items off of floor. She required min cues for safety with chord mgt. At end of session pt ambulated back to room. She was left with family at time of departure.      Therapy Documentation Precautions:  Precautions Precautions: Fall Restrictions Weight Bearing Restrictions: No :   Pain: No c/o pain during session    ADL: ADL ADL Comments: Please see functional navigator for ADL status :    See Function Navigator for Current Functional Status.   Therapy/Group: Individual Therapy  Zackarey Holleman A Brentney Goldbach 11/02/2016, 4:26 PM

## 2016-11-02 NOTE — Sedation Documentation (Signed)
1135: pt 94% on RA

## 2016-11-03 ENCOUNTER — Inpatient Hospital Stay (HOSPITAL_COMMUNITY): Payer: Medicare Other | Admitting: Speech Pathology

## 2016-11-03 ENCOUNTER — Inpatient Hospital Stay (HOSPITAL_COMMUNITY): Payer: Medicare Other

## 2016-11-03 ENCOUNTER — Encounter (HOSPITAL_COMMUNITY): Payer: Medicare Other | Admitting: Psychology

## 2016-11-03 ENCOUNTER — Inpatient Hospital Stay (HOSPITAL_COMMUNITY): Payer: Medicare Other | Admitting: Physical Therapy

## 2016-11-03 ENCOUNTER — Inpatient Hospital Stay (HOSPITAL_COMMUNITY): Payer: Medicare Other | Admitting: Occupational Therapy

## 2016-11-03 DIAGNOSIS — T82868A Thrombosis of vascular prosthetic devices, implants and grafts, initial encounter: Secondary | ICD-10-CM

## 2016-11-03 LAB — PROTEIN ELECTROPHORESIS, SERUM
A/G RATIO SPE: 1.1 (ref 0.7–1.7)
ALBUMIN ELP: 3.1 g/dL (ref 2.9–4.4)
ALPHA-1-GLOBULIN: 0.2 g/dL (ref 0.0–0.4)
Alpha-2-Globulin: 1.1 g/dL — ABNORMAL HIGH (ref 0.4–1.0)
Beta Globulin: 0.9 g/dL (ref 0.7–1.3)
GAMMA GLOBULIN: 0.6 g/dL (ref 0.4–1.8)
GLOBULIN, TOTAL: 2.8 g/dL (ref 2.2–3.9)
TOTAL PROTEIN ELP: 5.9 g/dL — AB (ref 6.0–8.5)

## 2016-11-03 LAB — GLUCOSE, CAPILLARY
GLUCOSE-CAPILLARY: 197 mg/dL — AB (ref 65–99)
Glucose-Capillary: 120 mg/dL — ABNORMAL HIGH (ref 65–99)
Glucose-Capillary: 94 mg/dL (ref 65–99)
Glucose-Capillary: 129 mg/dL — ABNORMAL HIGH (ref 65–99)

## 2016-11-03 NOTE — Progress Notes (Signed)
Occupational Therapy Session Note  Patient Details  Name: Alexandria [name] MRN: [id number] Date of Birth: 1945-08-21  Today's Date: 11/03/2016 OT Individual Time: 1259-1345 OT Individual Time Calculation (min): 46 min    Short Term Goals: Week 1:  OT Short Term Goal 1 (Week 1): STGs=LTGs due to ELOS  Skilled Therapeutic Interventions/Progress Updates: Skilled OT session completed with focus on higher level cognitive skills, functional ambulation, and endurance. Pt was sitting in recliner at time of arrival, agreeable to session. Had her recall directions to cafeteria, required min questioning cues for using external resources to locate. Once in cafeteria, she selected lunch items that she would purchase if she were on an outing with friends in community. She was able to problem solve and distribute correct sum of money at register without cues today. She ambulated to outdoor environment to eat food items for a rest break. Afterwards, she required min questioning cues to find her way back to unit. She completed all functional ambulation without AD at supervision level. She was left in recliner with all needs at time of departure.       Therapy Documentation Precautions:  Precautions Precautions: Fall Restrictions Weight Bearing Restrictions: No   Vital Signs: Therapy Vitals Temp: 98 F (36.7 C) Temp Source: Oral Pulse Rate: 86 Resp: 18 BP: 115/71 Patient Position (if appropriate): Sitting Oxygen Therapy SpO2: 96 % O2 Device: Not Delivered Pain: No c/o pain during session  Pain Assessment Pain Assessment: No/denies pain ADL: ADL ADL Comments: Please see functional navigator for ADL status :    See Function Navigator for Current Functional Status.   Therapy/Group: Individual Therapy  Demari Gales A Nessa Ramaker 11/03/2016, 3:50 PM

## 2016-11-03 NOTE — Progress Notes (Signed)
At 1950, complained of to new central line site, PRN vicodin given with relief. Continues with LUE edema and bruising. Dressing to old LUE PICC site, C, D, & I. Refusing SCD's. Patrici Ranks A

## 2016-11-03 NOTE — Progress Notes (Signed)
Occupational Therapy Session Note  Patient Details  Name: Alexandria [name] MRN: [id number] Date of Birth: 11-12-1945  Today's Date: 11/03/2016 OT Individual Time: 1000-1100 OT Individual Time Calculation (min): 60 min    Short Term Goals: Week 1:  OT Short Term Goal 1 (Week 1): STGs=LTGs due to ELOS  Skilled Therapeutic Interventions/Progress Updates:    Pt engaged in BADL retraining including bathing at shower level and dressing with sit<>stand from chair.  Pt completed all bathing/dressing tasks at supervision level with no LOB or unsafe behaviors noted.  Pt completed grooming tasks while standing at sink.  Pt amb without AD to ADL apartment and engaged in retrieving all bed linens from floor and making up bed.  Pt able to problem solve appropriately and complete task with no LOB. Pt returned to room and remained in recliner with son-in-law present.  Focus on activity tolerance, functional amb without AD, standing balance, cognitive remediation, home mgmt tasks and safety awareness to increase independence with BADLs.   Therapy Documentation Precautions:  Precautions Precautions: Fall Restrictions Weight Bearing Restrictions: No   Pain:  Pt denied pain ADL: ADL ADL Comments: Please see functional navigator for ADL status  See Function Navigator for Current Functional Status.   Therapy/Group: Individual Therapy  Leroy Libman 11/03/2016, 11:10 AM

## 2016-11-03 NOTE — Progress Notes (Signed)
[facility] PHYSICAL MEDICINE & REHABILITATION     PROGRESS NOTE    Subjective/Complaints: Pt without new issues. She is sitting in chair with son. Denies pain at present. Weekend was ok except for issues surrounding PICC. Had questions about prognosis, recovery  ROS: pt denies nausea, vomiting, diarrhea, cough, shortness of breath or chest pain   Objective: Vital Signs: Blood pressure 110/66, pulse 85, temperature 97.8 F (36.6 C), temperature source Oral, resp. rate 18, height 5\' 2"  (1.575 m), weight 74.3 kg (163 lb 12.8 oz), SpO2 95 %. Ir Fluoro Guide Cv Line Right  Result Date: 11/02/2016 CLINICAL DATA:  Brain abscess, needs durable venous access for antibiotics. Left arm DVT secondary to PICC line. Previous right mastectomy and axillary node dissection. EXAM: RIGHT IJ TUNNELED CENTRAL VENOUS CATHETER PLACEMENT UNDER ULTRASOUND AND FLUOROSCOPIC GUIDANCE FLUOROSCOPY TIME:  12 seconds, 1 mGy TECHNIQUE: The procedure, risks (including but not limited to bleeding, infection, organ damage ), benefits, and alternatives were explained to the patient. Questions regarding the procedure were encouraged and answered. The patient understands and consents to the procedure. Patency of the right IJ vein was confirmed with ultrasound with image documentation. An appropriate skin site was determined. Skin site was marked. Region was prepped using maximum barrier technique including cap and mask, sterile gown, sterile gloves, large sterile sheet, and Chlorhexidine as cutaneous antisepsis. The region was infiltrated locally with 1% lidocaine. Under real-time ultrasound guidance, the right IJ vein was accessed with a 21 gauge micropuncture needle; the needle tip within the vein was confirmed with ultrasound image documentation. The needle exchanged over a guidewire for peel-away sheath through which a 17cm 5 [place] PowerPICC dual lumen catheter tunneled from right anterior chest wall was advanced. This was  positioned with the tip at the cavoatrial junction. Spot chest radiograph shows good positioning and no pneumothorax. Catheter was flushed and sutured externally with 0-Prolene sutures. Patient tolerated the procedure well. COMPLICATIONS: None immediate IMPRESSION: 1. Technically successful tunneled right IJ double lumen power injectable central venous catheter placement. Electronically Signed   By: Lucrezia Europe M.D.   On: 11/02/2016 11:44   Ir US Guide Vasc Access Right  Result Date: 11/02/2016 CLINICAL DATA:  Brain abscess, needs durable venous access for antibiotics. Left arm DVT secondary to PICC line. Previous right mastectomy and axillary node dissection. EXAM: RIGHT IJ TUNNELED CENTRAL VENOUS CATHETER PLACEMENT UNDER ULTRASOUND AND FLUOROSCOPIC GUIDANCE FLUOROSCOPY TIME:  12 seconds, 1 mGy TECHNIQUE: The procedure, risks (including but not limited to bleeding, infection, organ damage ), benefits, and alternatives were explained to the patient. Questions regarding the procedure were encouraged and answered. The patient understands and consents to the procedure. Patency of the right IJ vein was confirmed with ultrasound with image documentation. An appropriate skin site was determined. Skin site was marked. Region was prepped using maximum barrier technique including cap and mask, sterile gown, sterile gloves, large sterile sheet, and Chlorhexidine as cutaneous antisepsis. The region was infiltrated locally with 1% lidocaine. Under real-time ultrasound guidance, the right IJ vein was accessed with a 21 gauge micropuncture needle; the needle tip within the vein was confirmed with ultrasound image documentation. The needle exchanged over a guidewire for peel-away sheath through which a 17cm 5 [place] PowerPICC dual lumen catheter tunneled from right anterior chest wall was advanced. This was positioned with the tip at the cavoatrial junction. Spot chest radiograph shows good positioning and no pneumothorax.  Catheter was flushed and sutured externally with 0-Prolene sutures. Patient tolerated the procedure well.  COMPLICATIONS: None immediate IMPRESSION: 1. Technically successful tunneled right IJ double lumen power injectable central venous catheter placement. Electronically Signed   By: Lucrezia Europe M.D.   On: 11/02/2016 11:44   No results for input(s): WBC, HGB, HCT, PLT in the last 72 hours. No results for input(s): NA, K, CL, GLUCOSE, BUN, CREATININE, CALCIUM in the last 72 hours.  Invalid input(s): CO CBG (last 3)   Recent Labs  11/02/16 1657 11/02/16 2107 11/03/16 0638  GLUCAP 185* 176* 120*    Wt Readings from Last 3 Encounters:  10/30/16 74.3 kg (163 lb 12.8 oz)  10/23/16 74.9 kg (165 lb 2 oz)    Physical Exam:  HENT:  Craniotomy site clean and dry with sutures in place Eyes: EOMI  Neck: Normal range of motion. Neck supple. No thyromegaly present.  Cardiovascular: RRR.   Respiratory: CTA B  GI: Soft. Bowel sounds are normal. She exhibits no distension.  Neurological: She is alert.   Follows simple commands. Functional communication. Speech clear.  Motor strength is 5/5 bilateral deltoid, bicep, tricep grip. 4+/5 Bilateral hip flexor, knee extensor, ankle dorsiflexor. Fair insight and awareness.  Psych: a little flat, but otherwise very appropriate EXT-  ecchymotic areas Left forearm with mild swelling left forearm trace edema dorsum left hand. Assessment/Plan: 1. 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 Physiatrist is providing close team supervision and 24 hour management of active medical problems listed below. Physiatrist and rehab team continue to assess barriers to discharge/monitor patient progress toward functional and medical goals.  Function:  Bathing Bathing position   Position: Shower  Bathing parts Body parts bathed by  patient: Right arm, Left arm, Chest, Abdomen, Front perineal area, Buttocks, Right upper leg, Left upper leg, Right lower leg, Left lower leg, Back    Bathing assist Assist Level: Supervision or verbal cues      Upper Body Dressing/Undressing Upper body dressing   What is the patient wearing?: Button up shirt, Bra Bra - Perfomed by patient: Thread/unthread right bra strap, Thread/unthread left bra strap, Hook/unhook bra (pull down sports bra)   Pull over shirt/dress - Perfomed by patient: Thread/unthread right sleeve, Thread/unthread left sleeve, Put head through opening, Pull shirt over trunk   Button up shirt - Perfomed by patient: Thread/unthread right sleeve, Thread/unthread left sleeve, Pull shirt around back, Button/unbutton shirt      Upper body assist Assist Level: Supervision or verbal cues      Lower Body Dressing/Undressing Lower body dressing   What is the patient wearing?: Pants, Shoes, Socks, Underwear Underwear - Performed by patient: Thread/unthread right underwear leg, Thread/unthread left underwear leg, Pull underwear up/down   Pants- Performed by patient: Thread/unthread right pants leg, Thread/unthread left pants leg, Pull pants up/down, Fasten/unfasten pants       Socks - Performed by patient: Don/doff right sock, Don/doff left sock   Shoes - Performed by patient: Don/doff right shoe, Don/doff left shoe Shoes - Performed by helper: Don/doff left shoe          Lower body assist Assist for lower body dressing: Supervision or verbal cues      Toileting Toileting Toileting activity did not occur: N/A (pt wearing a gown) Toileting steps completed by patient: Adjust clothing prior to toileting, Performs perineal hygiene, Adjust clothing after toileting   Toileting Assistive Devices: Grab bar or rail  Toileting assist Assist level: Supervision or verbal  cues   Transfers Chair/bed transfer   Chair/bed transfer method: Ambulatory Chair/bed transfer assist  level: Supervision or verbal cues Chair/bed transfer assistive device: Armrests     Locomotion Ambulation     Max distance: >1000 Assist level: Supervision or verbal cues   Wheelchair          Cognition Comprehension Comprehension assist level: Understands complex 90% of the time/cues 10% of the time  Expression Expression assist level: Expresses basic 90% of the time/requires cueing < 10% of the time.  Social Interaction Social Interaction assist level: Interacts appropriately with others with medication or extra time (anti-anxiety, antidepressant).  Problem Solving Problem solving assist level: Solves complex 90% of the time/cues < 10% of the time  Memory Memory assist level: More than reasonable amount of time   Medical Problem List and Plan: 1.  Decreased functional mobility with cognitive deficits secondary to Left frontal lobe brain abscess  -CIR PT, OT SLP 2.  DVT Prophylaxis/left subclavian DVT:  -PICC removed, tunneled Right CVC  Placed yesterday by INR---mild pain at site  -LUE with minimal clinical signs.  -Pt is not an anticoagulation candidate per NS 3. Pain Management: Hydrocodone as needed 4. Mood: Xanax 0.125-0.25 mg 2 at bedtime as needed 5. Neuropsych: This patient is capable of making decisions on her own behalf. 6. Skin/Wound Care: Routine skin checks,  7. Fluids/Electrolytes/Nutrition: encourage po    8. ID/Streptococcus intermedius. Intravenous ceftriaxone and oral Flagyl 6 weeks ending 12/08/2016 and follow-up per infectious disease   -wbc's 6.8 9. Seizure prophylaxis. Keppra 500 mg twice a day no seizure activity 10. Hyperglycemia secondary to Decadron. Monitor with decrease in steroids--sugars are normal  -dc cbg's 11. Hypertension. Toprol-XL 100 mg daily, Lotensin 20 mg daily, HCTZ 25 mg daily 12.. Constipation. Laxative assistance    LOS (Days) 4 A FACE TO FACE EVALUATION WAS PERFORMED  Meredith Staggers, MD 11/03/2016 8:54 AM

## 2016-11-03 NOTE — Plan of Care (Signed)
Problem: RH Ambulation Goal: LTG Patient will ambulate in community environment (PT) LTG: Patient will ambulate in community environment, # of feet with assistance (PT).  150 ft

## 2016-11-03 NOTE — Consult Note (Signed)
Neuropsychological Consultation   Patient:  Alexandria [name]   DOB: August 25, 1945  MR Number: [id number]  Location: [facility] [address] [place] [id number] [place] [address] Dept: [phone] Loc: [phone]  Start: 11 AM End: 12 PM  Provider/Observer:     Ilean Skill   Chief Complaint:     Cognitive changes and coping issues following discovery and treatment for Brain Abscess.    Reason For Service:     HPI from Medical records:  Alexandria [name] Revingtonis a 71 y.o.right handed femalewith history of hypertension, remote history of breast cancer 1996. Per chart review patient lives alone and was independent prior to admission. 2 level home with bedroom on main level. Family and area question 24-hour assist. Presented 10/22/2016 with altered mental status that progressed over the past few days. CT imaging demonstrated a masslike lesion in the left frontal lobe with surrounding vasogenic edema and localized mass effect question malignancy versus abscess. CT of abdomen and pelvis as well as CT of chest negative for acute findings. Neurosurgery consulted and underwent left craniotomy tumor excision 10/27/2016 per Dr. Vertell Limber. Infectious disease consulted with blood cultures showing Streptococcus intermediusand presently on Intravenous ceftriaxone and oral metronidazole anticipate 6 weeks of antibiotic care.Decadron protocol as directed.Keppra added for seizure prophylaxis.Tolerating a regular diet. Physical and occupational therapy evaluation completed with recommendations of physical medicine rehabilitation consult. Patient was admitted for a comprehensive rehabilitation program  Patient was referred for a neuropsychological consultation due to coping issues and to do initial cognitive screening and neuropsychological assessment for treatment and planning purposes.  Testing Administered:  RBANS  Participation  Level:   Active  Participation Quality:  Appropriate      Behavioral Observation:  Well Groomed, Alert, and Appropriate.   Test Results:   The patient did very well on all tests with the exception verbal fluency measures and line orientation measures.  The line orientation is not likely due to visual spatial deficits but due to executive function deficits related to judgement and problem solving.  RBANS Update Form A Immediate Memory 97  RBANS Update Form A Visuospatial/ Constructional 100  RBANS Update Form A Language 82  RBANS Update Form A Attention 85  RBANS Update Form A Delayed Memory 117  RBANS Update Form A Total Scale 94   The neuropsychological deficits are within issues of verbal fluency but naming very good.  The patient did have lower than expected auditor encoding but was still at the lower end of average range.  Judgement and planning still problematic.    Ilean Skill, Psy.D. Neuropsychologist

## 2016-11-03 NOTE — Progress Notes (Signed)
Physical Therapy Session Note  Patient Details  Name: Alexandria [name] MRN: [id number] Date of Birth: 01/19/1946  Today's Date: 11/03/2016 PT Individual Time: 3244-0102 PT Individual Time Calculation (min): 43 min   Short Term Goals: Week 1:  PT Short Term Goal 1 (Week 1): =LTG due to estimated LOS  Skilled Therapeutic Interventions/Progress Updates:  Pt received in recliner & agreeable to tx. Pt ambulated room<>gym without AD and supervision, demonstrating 1 LOB but able to recover without assistance. Pt utilized [employer] limits of stability & postural control with moving base with BUE & 1UE support. Pt demonstrates decreased ability to weight shift R and is internally distracted throughout task. Pt utilized cybex kinetron in standing with BUE support with task focusing on BLE strengthening. Pt participated in dual task (ambulating while tossing ball with therapist and engaging in cognitive task simultaneously) with supervision. At end of session pt returned to room & left in recliner with BLE elevated & all needs within reach.    During session therapist educated pt on inability to drive until cleared by MD, recommendations of supervision following d/c, and f/u OPPT; pt voiced understanding of all education.   Therapy Documentation Precautions:  Precautions Precautions: Fall Restrictions Weight Bearing Restrictions: No  Pain: No c/o pain noted.   See Function Navigator for Current Functional Status.   Therapy/Group: Individual Therapy  Waunita Schooner 11/03/2016, 3:07 PM

## 2016-11-03 NOTE — Progress Notes (Signed)
Speech Language Pathology Daily Session Note  Patient Details  Name: Alexandria [name] MRN: [id number] Date of Birth: [birth year]  Today's Date: 11/03/2016 SLP Individual Time: 0830-0930 SLP Individual Time Calculation (min): 60 min  Short Term Goals: Week 1: SLP Short Term Goal 1 (Week 1): Pt will utilize word finding strategies to name 10 items within requested category with Min A verbal cues.  SLP Short Term Goal 2 (Week 1): Pt will self-monitor and self- correct expressive errors with Min A verbal cues.  SLP Short Term Goal 3 (Week 1): Pt will complete semi-complex problem solving tasks with Min A cues. SLP Short Term Goal 4 (Week 1): Pt will demonstrate alternating attention for ~ 30 minutes with Min A verbal cues.  SLP Short Term Goal 5 (Week 1): Pt will demonstrate anticipatory awareness by stating 3 tasks that she is safe to perform within her home environment.   Skilled Therapeutic Interventions: Skilled treatment session focused on addressing cognition goals.  Patient recalled and wrote a familiar recipe with extra time to recall and self-monitor and correct errors related to spelling in a quiet environment.  SLP also facilitated task with Min question cues for specific qualities and brands when organizing a grocery list.  SLP facilitated session by providing Mod assist semantic cues to complete a categorical naming task with 25 items.  Son-in-law present to observe and participated in cuing as well; recommended they complete other similar tasks between therapy sessions.  Continue with current plan of care.    Function:  Cognition Comprehension Comprehension assist level: Understands complex 90% of the time/cues 10% of the time  Expression   Expression assist level: Expresses basic 90% of the time/requires cueing < 10% of the time.  Social Interaction Social Interaction assist level: Interacts appropriately with others with medication or extra time (anti-anxiety, antidepressant).   Problem Solving Problem solving assist level: Solves complex 90% of the time/cues < 10% of the time  Memory Memory assist level: More than reasonable amount of time    Pain Pain Assessment Pain Assessment: No/denies pain  Therapy/Group: Individual Therapy  Carmelia Roller., CCC-SLP [phone]  [place] 11/03/2016, 11:22 AM

## 2016-11-04 ENCOUNTER — Inpatient Hospital Stay (HOSPITAL_COMMUNITY): Payer: Medicare Other | Admitting: Speech Pathology

## 2016-11-04 ENCOUNTER — Inpatient Hospital Stay (HOSPITAL_COMMUNITY): Payer: Medicare Other | Admitting: Physical Therapy

## 2016-11-04 ENCOUNTER — Inpatient Hospital Stay (HOSPITAL_COMMUNITY): Payer: Medicare Other | Admitting: Occupational Therapy

## 2016-11-04 LAB — GLUCOSE, CAPILLARY
GLUCOSE-CAPILLARY: 128 mg/dL — AB (ref 65–99)
GLUCOSE-CAPILLARY: 134 mg/dL — AB (ref 65–99)
GLUCOSE-CAPILLARY: 152 mg/dL — AB (ref 65–99)
Glucose-Capillary: 110 mg/dL — ABNORMAL HIGH (ref 65–99)

## 2016-11-04 NOTE — Progress Notes (Signed)
[place] PHYSICAL MEDICINE & REHABILITATION     PROGRESS NOTE    Subjective/Complaints: In chair. Just finished breakfast. Left arm a little sore but otherwise doing well.   ROS: pt denies nausea, vomiting, diarrhea, cough, shortness of breath or chest pain    Objective: Vital Signs: Blood pressure 99/67, pulse 65, temperature 98.7 F (37.1 C), temperature source Oral, resp. rate 18, height 5\' 2"  (1.575 m), weight 74.3 kg (163 lb 12.8 oz), SpO2 97 %. Ir Fluoro Guide Cv Line Right  Result Date: 11/02/2016 CLINICAL DATA:  Brain abscess, needs durable venous access for antibiotics. Left arm DVT secondary to PICC line. Previous right mastectomy and axillary node dissection. EXAM: RIGHT IJ TUNNELED CENTRAL VENOUS CATHETER PLACEMENT UNDER ULTRASOUND AND FLUOROSCOPIC GUIDANCE FLUOROSCOPY TIME:  12 seconds, 1 mGy TECHNIQUE: The procedure, risks (including but not limited to bleeding, infection, organ damage ), benefits, and alternatives were explained to the patient. Questions regarding the procedure were encouraged and answered. The patient understands and consents to the procedure. Patency of the right IJ vein was confirmed with ultrasound with image documentation. An appropriate skin site was determined. Skin site was marked. Region was prepped using maximum barrier technique including cap and mask, sterile gown, sterile gloves, large sterile sheet, and Chlorhexidine as cutaneous antisepsis. The region was infiltrated locally with 1% lidocaine. Under real-time ultrasound guidance, the right IJ vein was accessed with a 21 gauge micropuncture needle; the needle tip within the vein was confirmed with ultrasound image documentation. The needle exchanged over a guidewire for peel-away sheath through which a 17cm 5 [place] PowerPICC dual lumen catheter tunneled from right anterior chest wall was advanced. This was positioned with the tip at the cavoatrial junction. Spot chest radiograph shows good  positioning and no pneumothorax. Catheter was flushed and sutured externally with 0-Prolene sutures. Patient tolerated the procedure well. COMPLICATIONS: None immediate IMPRESSION: 1. Technically successful tunneled right IJ double lumen power injectable central venous catheter placement. Electronically Signed   By: Lucrezia Europe M.D.   On: 11/02/2016 11:44   Ir US Guide Vasc Access Right  Result Date: 11/02/2016 CLINICAL DATA:  Brain abscess, needs durable venous access for antibiotics. Left arm DVT secondary to PICC line. Previous right mastectomy and axillary node dissection. EXAM: RIGHT IJ TUNNELED CENTRAL VENOUS CATHETER PLACEMENT UNDER ULTRASOUND AND FLUOROSCOPIC GUIDANCE FLUOROSCOPY TIME:  12 seconds, 1 mGy TECHNIQUE: The procedure, risks (including but not limited to bleeding, infection, organ damage ), benefits, and alternatives were explained to the patient. Questions regarding the procedure were encouraged and answered. The patient understands and consents to the procedure. Patency of the right IJ vein was confirmed with ultrasound with image documentation. An appropriate skin site was determined. Skin site was marked. Region was prepped using maximum barrier technique including cap and mask, sterile gown, sterile gloves, large sterile sheet, and Chlorhexidine as cutaneous antisepsis. The region was infiltrated locally with 1% lidocaine. Under real-time ultrasound guidance, the right IJ vein was accessed with a 21 gauge micropuncture needle; the needle tip within the vein was confirmed with ultrasound image documentation. The needle exchanged over a guidewire for peel-away sheath through which a 17cm 5 [place] PowerPICC dual lumen catheter tunneled from right anterior chest wall was advanced. This was positioned with the tip at the cavoatrial junction. Spot chest radiograph shows good positioning and no pneumothorax. Catheter was flushed and sutured externally with 0-Prolene sutures. Patient tolerated the  procedure well. COMPLICATIONS: None immediate IMPRESSION: 1. Technically successful tunneled right IJ double lumen  power injectable central venous catheter placement. Electronically Signed   By: Lucrezia Europe M.D.   On: 11/02/2016 11:44   No results for input(s): WBC, HGB, HCT, PLT in the last 72 hours. No results for input(s): NA, K, CL, GLUCOSE, BUN, CREATININE, CALCIUM in the last 72 hours.  Invalid input(s): CO CBG (last 3)   Recent Labs  11/03/16 1630 11/03/16 2105 11/04/16 0625  GLUCAP 129* 197* 128*    Wt Readings from Last 3 Encounters:  10/30/16 74.3 kg (163 lb 12.8 oz)  10/23/16 74.9 kg (165 lb 2 oz)    Physical Exam:  HENT:  Craniotomy site clean and dry with sutures all in place Eyes: EOMI  Neck: Normal range of motion. Neck supple. No thyromegaly present.  Cardiovascular: RRR.   Respiratory: CTA B  GI: Soft. Bowel sounds are normal. She exhibits no distension.  Neurological: She is alert.   Follows simple commands. Functional communication. Speech clear.  Motor strength is 5/5 bilateral deltoid, bicep, tricep grip. 4+/5 Bilateral hip flexor, knee extensor, ankle dorsiflexor. Fair insight and awareness.  Psych: pleasant and appropriate EXT-  ecchymotic areas Left forearm with mild swelling left forearm trace edema dorsum left hand. Left upper arm a little sore with palpation  Assessment/Plan: 1. 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 Physiatrist is providing close team supervision and 24 hour management of active medical problems listed below. Physiatrist and rehab team continue to assess barriers to discharge/monitor patient progress toward functional and medical goals.  Function:  Bathing Bathing position   Position: Shower  Bathing parts Body parts bathed by patient: Right arm, Left arm, Chest, Abdomen, Front perineal  area, Buttocks, Right upper leg, Left upper leg, Right lower leg, Left lower leg, Back    Bathing assist Assist Level: Supervision or verbal cues      Upper Body Dressing/Undressing Upper body dressing   What is the patient wearing?: Pull over shirt/dress, Bra Bra - Perfomed by patient: Thread/unthread right bra strap, Thread/unthread left bra strap, Hook/unhook bra (pull down sports bra)   Pull over shirt/dress - Perfomed by patient: Thread/unthread right sleeve, Thread/unthread left sleeve, Put head through opening, Pull shirt over trunk   Button up shirt - Perfomed by patient: Thread/unthread right sleeve, Thread/unthread left sleeve, Pull shirt around back, Button/unbutton shirt      Upper body assist Assist Level: Supervision or verbal cues      Lower Body Dressing/Undressing Lower body dressing   What is the patient wearing?: Underwear, Pants, Socks, Shoes Underwear - Performed by patient: Thread/unthread right underwear leg, Thread/unthread left underwear leg, Pull underwear up/down   Pants- Performed by patient: Thread/unthread right pants leg, Thread/unthread left pants leg, Pull pants up/down, Fasten/unfasten pants       Socks - Performed by patient: Don/doff right sock, Don/doff left sock   Shoes - Performed by patient: Don/doff right shoe, Don/doff left shoe, Fasten right, Fasten left Shoes - Performed by helper: Don/doff left shoe          Lower body assist Assist for lower body dressing: Supervision or verbal cues      Toileting Toileting Toileting activity did not occur: N/A (pt wearing a gown) Toileting steps completed by patient: Adjust clothing prior to toileting, Performs perineal hygiene, Adjust clothing after toileting   Toileting Assistive Devices: Grab bar or rail  Toileting assist Assist level: Supervision or verbal cues  Transfers Chair/bed transfer   Chair/bed transfer method: Ambulatory Chair/bed transfer assist level: Supervision or verbal  cues Chair/bed transfer assistive device: Armrests     Locomotion Ambulation     Max distance: >300 ft Assist level: Supervision or verbal cues   Wheelchair          Cognition Comprehension Comprehension assist level: Understands complex 90% of the time/cues 10% of the time  Expression Expression assist level: Expresses complex 90% of the time/cues < 10% of the time  Social Interaction Social Interaction assist level: Interacts appropriately 90% of the time - Needs monitoring or encouragement for participation or interaction.  Problem Solving Problem solving assist level: Solves basic problems with no assist  Memory Memory assist level: More than reasonable amount of time   Medical Problem List and Plan: 1.  Decreased functional mobility with cognitive deficits secondary to Left frontal lobe brain abscess  -CIR PT, OT SLP  -team conference today 2.  DVT Prophylaxis/left subclavian DVT:  -PICC removed, tunneled Right CVC placedy by INR---mild pain at former picc site  -Pt is not an anticoagulation candidate per NS 3. Pain Management: Hydrocodone as needed 4. Mood: Xanax 0.125-0.25 mg 2 at bedtime as needed 5. Neuropsych: This patient is capable of making decisions on her own behalf. 6. Skin/Wound Care: Routine skin checks,  7. Fluids/Electrolytes/Nutrition: encourage po intake   8. ID/Streptococcus intermedius. Intravenous ceftriaxone and oral Flagyl 6 weeks ending 12/08/2016 and follow-up per infectious disease   -wbc's 6.8 9. Seizure prophylaxis. Keppra 500 mg twice a day no seizure activity 10. Hyperglycemia secondary to Decadron. Monitor with decrease in steroids--sugars are normal  -dc cbg's 11. Hypertension. Toprol-XL 100 mg daily, Lotensin 20 mg daily, HCTZ 25 mg daily  -bp under tight control 12.. Constipation. Laxative assistance    LOS (Days) 5 A FACE TO FACE EVALUATION WAS PERFORMED  Meredith Staggers, MD 11/04/2016 9:35 AM

## 2016-11-04 NOTE — Progress Notes (Signed)
Speech Language Pathology Daily Session Note  Patient Details  Name: Alexandria [name] MRN: [id number] Date of Birth: April 09, 1946  Today's Date: 11/04/2016 SLP Individual Time: 0900-1000 SLP Individual Time Calculation (min): 60 min  Short Term Goals: Week 1: SLP Short Term Goal 1 (Week 1): Pt will utilize word finding strategies to name 10 items within requested category with Min A verbal cues.  SLP Short Term Goal 2 (Week 1): Pt will self-monitor and self- correct expressive errors with Min A verbal cues.  SLP Short Term Goal 3 (Week 1): Pt will complete semi-complex problem solving tasks with Min A cues. SLP Short Term Goal 4 (Week 1): Pt will demonstrate alternating attention for ~ 30 minutes with Min A verbal cues.  SLP Short Term Goal 5 (Week 1): Pt will demonstrate anticipatory awareness by stating 3 tasks that she is safe to perform within her home environment.   Skilled Therapeutic Interventions: Skilled treatment session focused on cognitive goals. Patient recalled her current medications and their functions with 90% accuracy and organized a 4 time per day pill box with Mod I. Patient participated in a functional conversation and required intermittent extra time and supervision verbal cues for word-finding. Patient left upright in recliner with all needs within reach. Continue with current plan of care.      Function:    Cognition Comprehension Comprehension assist level: Understands complex 90% of the time/cues 10% of the time  Expression   Expression assist level: Expresses complex 90% of the time/cues < 10% of the time  Social Interaction Social Interaction assist level: Interacts appropriately 90% of the time - Needs monitoring or encouragement for participation or interaction.  Problem Solving Problem solving assist level: Solves complex 90% of the time/cues < 10% of the time  Memory Memory assist level: Recognizes or recalls 90% of the time/requires cueing < 10% of the  time    Pain Pain Assessment Pain Assessment: No/denies pain  Therapy/Group: Individual Therapy  Alexandria [name] 11/04/2016, 3:36 PM

## 2016-11-04 NOTE — Progress Notes (Signed)
PHARMACY CONSULT NOTE FOR:  OUTPATIENT  PARENTERAL ANTIBIOTIC THERAPY (OPAT)  Indication: Brain abscess Regimen: ceftriaxone 2g IV Q12H End date: 12/08/16  IV antibiotic discharge orders are pended. To discharging provider:  please sign these orders via discharge navigator,  Select New Orders & click on the button choice - Manage This Unsigned Work.     Thank you for allowing pharmacy to be a part of this patient's care.  Gwenlyn Perking, PharmD PGY1 Pharmacy Resident Pager: [phone] 11/04/2016 3:42 PM

## 2016-11-04 NOTE — Progress Notes (Signed)
Physical Therapy Session Note  Patient Details  Name: Alexandria [name] MRN: [id number] Date of Birth: Feb 24, 1946  Today's Date: 11/04/2016 PT Individual Time: 1105-1200 PT Individual Time Calculation (min): 55 min   Short Term Goals: Week 1:  PT Short Term Goal 1 (Week 1): =LTG due to estimated LOS  Skilled Therapeutic Interventions/Progress Updates:  Pt received in recliner & agreeable to tx. Pt's friend, Ned Grace, present for session. Session focused on high level balance & gait training, strength & endurance training, and cognitive remediation. Pt performed backwards walking and side stepping without BUE support and close supervision, as well as standing on rocker board with eyes open and closed without BUE support and supervision. Pt engaged in recall & path finding task. Pt able to path find room<>gift shop without cuing and use of signs in hallway. Pt able to recall and locate 2/4 items in gift shop that therapist had instructed her on prior to leaving unit. Discussed various methods for remembering items or "to-do list". Pt reports she plans to get a puppy shortly after d/c home, educated pt on need for assistance and to be cautious of a puppy causing increased fall risks with pt voicing understanding of education. Back on unit pt utilized nu-step up to level 4 x 12 minutes with BLE only for strength & endurance training. At end of session pt left sitting in recliner in room with all needs within reach, friend still present, and set up with meal tray.   Therapy Documentation Precautions:  Precautions Precautions: Fall Restrictions Weight Bearing Restrictions: No  Pain: No pain reported.   See Function Navigator for Current Functional Status.   Therapy/Group: Individual Therapy  Waunita Schooner 11/04/2016, 12:09 PM

## 2016-11-04 NOTE — Progress Notes (Deleted)
Patient information reviewed and entered into eRehab system by Susette Seminara, RN, CRRN, PPS Coordinator.  Information including medical coding and functional independence measure will be reviewed and updated through discharge.     Per nursing patient was given "Data Collection Information Summary for Patients in Inpatient Rehabilitation Facilities with attached "Privacy Act Statement-Health Care Records" upon admission.  

## 2016-11-04 NOTE — Progress Notes (Signed)
Occupational Therapy Session Note  Patient Details  Name: Alexandria [name] MRN: [id number] Date of Birth: 08-Jun-1946  Today's Date: 11/04/2016 OT Individual Time: 0730-0800 OT Individual Time Calculation (min): 30 min    Short Term Goals: Week 1:  OT Short Term Goal 1 (Week 1): STGs=LTGs due to ELOS  Skilled Therapeutic Interventions/Progress Updates: self care retraining at shower level and d/c planning. Pt able to ambulate around room with distant supervision to obtain all items for bathing and dressing without verbal cues. Pt bathed with setup and one questioning cues for sequencing/ organization (thoroughness). Pt dressed in bathroom without assistance with extra time. Pt with c/o about left arm (heaviness and swelling) due to DVT. Pt able to report her current medical condition including current treatments and contradictions  for left UE at this time. Pt did demonstrate decr processing speed (she would repeat what therapist just asked or referred to and then would continue with her thought). Pt left in recliner to finish eating her breakfast.   2nd session 13:00-13:30 1:1 cognitive retraining. Participated in complex organizational task with calendar and schedule. Pt with difficulty initiating the task requiring min A but then able to complete the task with questioning cues. Related tasks to practical application and d/c planning. Left with next SLP.      Therapy Documentation Precautions:  Precautions Precautions: Fall Restrictions Weight Bearing Restrictions: No Pain:  no c/o pain in either session ADL: ADL ADL Comments: Please see functional navigator for ADL status  See Function Navigator for Current Functional Status.   Therapy/Group: Individual Therapy  Willeen Cass [facility] 11/04/2016, 10:09 AM

## 2016-11-04 NOTE — Progress Notes (Signed)
Speech Language Pathology Daily Session Note  Patient Details  Name: Alexandria [name] MRN: [id number] Date of Birth: 11/07/1945  Today's Date: 11/04/2016 SLP Individual Time: 1330-1400 SLP Individual Time Calculation (min): 30 min  Short Term Goals: Week 1: SLP Short Term Goal 1 (Week 1): Pt will utilize word finding strategies to name 10 items within requested category with Min A verbal cues.  SLP Short Term Goal 2 (Week 1): Pt will self-monitor and self- correct expressive errors with Min A verbal cues.  SLP Short Term Goal 3 (Week 1): Pt will complete semi-complex problem solving tasks with Min A cues. SLP Short Term Goal 4 (Week 1): Pt will demonstrate alternating attention for ~ 30 minutes with Min A verbal cues.  SLP Short Term Goal 5 (Week 1): Pt will demonstrate anticipatory awareness by stating 3 tasks that she is safe to perform within her home environment.   Skilled Therapeutic Interventions: Skilled treatment session focused on cognition goals. SLP facilitated session by teaching pt novel semi-complex card game. Pt required Min A cues to complete game when paired against SLP. Instruction given to remember game for pt to instruction future SLP game. Pt was returned to room, left in recliner with daughter present. Continue per current plan of care.      Function:  Eating Eating   Modified Consistency Diet: No Eating Assist Level: No help, No cues           Cognition Comprehension Comprehension assist level: Understands complex 90% of the time/cues 10% of the time  Expression   Expression assist level: Expresses complex 90% of the time/cues < 10% of the time  Social Interaction Social Interaction assist level: Interacts appropriately 90% of the time - Needs monitoring or encouragement for participation or interaction.  Problem Solving Problem solving assist level: Solves complex 90% of the time/cues < 10% of the time  Memory Memory assist level: Recognizes or recalls  90% of the time/requires cueing < 10% of the time    Pain Pain Assessment Pain Assessment: No/denies pain  Therapy/Group: Individual Therapy   Alexandria [name], M.S., CCC-SLP Speech-Language Pathologist   Kunio Cummiskey 11/04/2016, 2:02 PM

## 2016-11-05 ENCOUNTER — Inpatient Hospital Stay (HOSPITAL_COMMUNITY): Payer: Medicare Other | Admitting: Physical Therapy

## 2016-11-05 ENCOUNTER — Inpatient Hospital Stay (HOSPITAL_COMMUNITY): Payer: Medicare Other | Admitting: *Deleted

## 2016-11-05 ENCOUNTER — Inpatient Hospital Stay (HOSPITAL_COMMUNITY): Payer: Medicare Other | Admitting: Occupational Therapy

## 2016-11-05 ENCOUNTER — Inpatient Hospital Stay (HOSPITAL_COMMUNITY): Payer: Medicare Other | Admitting: Speech Pathology

## 2016-11-05 DIAGNOSIS — A498 Other bacterial infections of unspecified site: Secondary | ICD-10-CM

## 2016-11-05 DIAGNOSIS — D479 Neoplasm of uncertain behavior of lymphoid, hematopoietic and related tissue, unspecified: Secondary | ICD-10-CM

## 2016-11-05 DIAGNOSIS — A491 Streptococcal infection, unspecified site: Secondary | ICD-10-CM

## 2016-11-05 DIAGNOSIS — B2790 Infectious mononucleosis, unspecified without complication: Secondary | ICD-10-CM

## 2016-11-05 DIAGNOSIS — B9689 Other specified bacterial agents as the cause of diseases classified elsewhere: Secondary | ICD-10-CM

## 2016-11-05 DIAGNOSIS — B954 Other streptococcus as the cause of diseases classified elsewhere: Secondary | ICD-10-CM

## 2016-11-05 DIAGNOSIS — Z9889 Other specified postprocedural states: Secondary | ICD-10-CM

## 2016-11-05 LAB — GLUCOSE, CAPILLARY
GLUCOSE-CAPILLARY: 124 mg/dL — AB (ref 65–99)
Glucose-Capillary: 123 mg/dL — ABNORMAL HIGH (ref 65–99)
Glucose-Capillary: 133 mg/dL — ABNORMAL HIGH (ref 65–99)
Glucose-Capillary: 115 mg/dL — ABNORMAL HIGH (ref 65–99)

## 2016-11-05 MED ORDER — METRONIDAZOLE 500 MG PO TABS
500.0000 mg | ORAL_TABLET | Freq: Four times a day (QID) | ORAL | Status: DC
  Filled 2016-11-05 (×2): qty 1

## 2016-11-05 MED ORDER — METRONIDAZOLE 500 MG PO TABS
500.0000 mg | ORAL_TABLET | Freq: Four times a day (QID) | ORAL | Status: DC
  Administered 2016-11-05 – 2016-11-07 (×7): 500 mg via ORAL
  Filled 2016-11-05 (×8): qty 1

## 2016-11-05 NOTE — Progress Notes (Signed)
Speech Language Pathology Daily Session Note  Patient Details  Name: Alexandria [name] MRN: [id number] Date of Birth: 08/08/1945  Today's Date: 11/05/2016 SLP Individual Time: 1300-1400 SLP Individual Time Calculation (min): 60 min  Short Term Goals: Week 1: SLP Short Term Goal 1 (Week 1): Pt will utilize word finding strategies to name 10 items within requested category with Min A verbal cues.  SLP Short Term Goal 2 (Week 1): Pt will self-monitor and self- correct expressive errors with Min A verbal cues.  SLP Short Term Goal 3 (Week 1): Pt will complete semi-complex problem solving tasks with Min A cues. SLP Short Term Goal 4 (Week 1): Pt will demonstrate alternating attention for ~ 30 minutes with Min A verbal cues.  SLP Short Term Goal 5 (Week 1): Pt will demonstrate anticipatory awareness by stating 3 tasks that she is safe to perform within her home environment.   Skilled Therapeutic Interventions: Skilled treatment session focused on community reintegration during an outing to the grocery store. Patient completed taks at overall Supervision level.  Goals focused on safe community mobility, path-finding, money management, verbal expression, identification & negotiation of obstacles, energy conservation techniques and education in regards to d/c planning.  See outing goal sheet in shadow chart for full details. Patient left upright in recliner with all needs within reach. Continue with current plan of care.      Function:  Cognition Comprehension Comprehension assist level: Understands complex 90% of the time/cues 10% of the time  Expression   Expression assist level: Expresses complex 90% of the time/cues < 10% of the time  Social Interaction Social Interaction assist level: Interacts appropriately 90% of the time - Needs monitoring or encouragement for participation or interaction.  Problem Solving Problem solving assist level: Solves complex 90% of the time/cues < 10% of the time   Memory Memory assist level: Recognizes or recalls 90% of the time/requires cueing < 10% of the time    Pain Pain Assessment Pain Assessment: No/denies pain  Therapy/Group: Individual Therapy  Jahaan Vanwagner 11/05/2016, 2:34 PM

## 2016-11-05 NOTE — Progress Notes (Signed)
[facility]ious [facility]    Date of Admission:  10/30/2016   Total days of antibiotics 10        Day 10 Ceftriaxone        Day 10 Metronidazole       Reason for Consult: Brain abscess     Active Problems:   Brain abscess   . benazepril  40 mg Oral Daily   And  . hydrochlorothiazide  25 mg Oral Daily  . cefTRIAXone (ROCEPHIN)  IV  2 g Intravenous Q12H  . dexamethasone  4 mg Oral Q12H  . insulin aspart  0-9 Units Subcutaneous TID WC  . levETIRAcetam  500 mg Oral BID  . metoprolol succinate  100 mg Oral QPM  . metroNIDAZOLE  500 mg Oral Q8H  . pantoprazole  40 mg Oral Daily  . saccharomyces boulardii  250 mg Oral BID    Recommendations: 1. Continue current antibiotics. Arrange for outpatient IV antibiotics.  2. Consider brain MRI or PET scan in a few weeks to rule out malignancy vs abscess.   Assessment: Patient has brain abscess. Cultures grew Streptococcus intermedius and Fusobacterium nucleatum. EBV VCA antibody panel not suggestive of acute EBV infection. Unlikely to treat for a EBV infection at this time. Patient states feeling good today and has been afebrile. She denies any new symptoms/side effects from the antibiotics, aside from diarrhea which she attributes to history of IBS.    HPI: Alexandria [name] is a 71 y.o. female admitted for a 7-day rehab course s/p left craniotomy to remove a left frontal-temporal lobe abscess/mass. She was started on ceftriaxone and metronidazole for antibiotic coverage.     Lab Results Lab Results  Component Value Date   WBC 6.8 10/31/2016   HGB 12.8 10/31/2016   HCT 39.2 10/31/2016   MCV 88.5 10/31/2016   PLT 165 10/31/2016    Lab Results  Component Value Date   CREATININE 0.80 10/31/2016   BUN 12 10/31/2016   NA 139 10/31/2016   K 3.5 10/31/2016   CL 102 10/31/2016   CO2 27 10/31/2016    Lab Results  Component Value Date   ALT 23 10/31/2016   AST 16 10/31/2016   ALKPHOS 42 10/31/2016   BILITOT 0.7 10/31/2016     Microbiology: Recent Results (from the past 240 hour(s))  Aerobic/Anaerobic Culture (surgical/deep wound)     Status: None   Collection Time: 10/27/16  8:48 AM  Result Value Ref Range Status   Specimen Description ABSCESS  Final   Special Requests LEFT FRONTAL LOBE BRAIN MASS  Final   Gram Stain   Final    ABUNDANT WBC PRESENT, PREDOMINANTLY PMN ABUNDANT GRAM POSITIVE COCCI IN PAIRS IN CHAINS CRITICAL RESULT CALLED TO, READ BACK BY AND VERIFIED WITH: Valarie Merino AT 3710 10/27/16 BY L BENFIELD    Culture   Final    ABUNDANT STREPTOCOCCUS INTERMEDIUS MODERATE FUSOBACTERIUM NUCLEATUM BETA LACTAMASE NEGATIVE    Report Status 11/01/2016 FINAL  Final   Organism ID, Bacteria STREPTOCOCCUS INTERMEDIUS  Final      Susceptibility   Streptococcus intermedius - MIC*    PENICILLIN <=0.06 SENSITIVE Sensitive     CEFTRIAXONE 0.25 SENSITIVE Sensitive     ERYTHROMYCIN >=8 RESISTANT Resistant     LEVOFLOXACIN 1 SENSITIVE Sensitive     VANCOMYCIN 0.5 SENSITIVE Sensitive     * ABUNDANT STREPTOCOCCUS INTERMEDIUS  Aerobic/Anaerobic Culture (surgical/deep wound)     Status: None  Collection Time: 10/27/16  9:14 AM  Result Value Ref Range Status   Specimen Description TISSUE  Final   Special Requests LEFT FRONTAL LOBE BRAIN MASS  Final   Gram Stain   Final    MODERATE WBC PRESENT,BOTH PMN AND MONONUCLEAR RARE GRAM POSITIVE COCCI IN PAIRS CRITICAL RESULT CALLED TO, READ BACK BY AND VERIFIED WITH: P LOPEZ,RN AT 1013 10/27/16 BY L BENFIELD    Culture   Final    FEW STREPTOCOCCUS INTERMEDIUS SUSCEPTIBILITIES PERFORMED ON PREVIOUS CULTURE WITHIN THE LAST 5 DAYS. NO ANAEROBES ISOLATED    Report Status 11/01/2016 FINAL  Final    Marylou Flesher, PharmD Candidate 11/05/2016, 12:59 PM

## 2016-11-05 NOTE — Progress Notes (Signed)
Social Work Patient ID: Alexandria [name], female   DOB: 01/13/1946, 71 y.o.   MRN: [id number]   CSW met with pt and later spoke with her dtr via telephone to update them on team conference discussion.  Both feel comfortable with pt's d/c on Friday, 11-07-16.  [facility] to provide RN/OT/PT/ST at home to continue pt's rehab at home and to assist with home IV antibiotics.  Pt does not need any DME.  She will try built in bench in shower at home and if that doesn't work out, dtr will buy her a shower seat.  Dtr has some medical questions and CSW asked for PA, Reesa Chew, to call to answer these.  CSW remains available to assist as needed.

## 2016-11-05 NOTE — Patient Care Conference (Signed)
Inpatient RehabilitationTeam Conference and Plan of Care Update Date: 11/04/2016   Time: 2:30 PM    Patient Name: Alexandria [name]      Medical Record Number: [id number]  Date of Birth: 1946-04-17 Sex: Female         Room/Bed: 4M05C/4M05C-01 Payor Info: Payor: [employer] MEDICARE / Plan: [employer] MEDICARE / Product Type: *No Product type* /    Admitting Diagnosis: Brain Abcess  Admit Date/Time:  10/30/2016  5:55 PM Admission Comments: No comment available   Primary Diagnosis:  <principal problem not specified> Principal Problem: <principal problem not specified>  Patient Active Problem List   Diagnosis Date Noted  . Brain tumor ([place])   . Hyperglycemia 10/27/2016  . Acute encephalopathy 10/23/2016  . Hx of breast cancer 10/23/2016  . Essential hypertension 10/23/2016  . Hypokalemia 10/23/2016  . Brain abscess 10/22/2016    Expected Discharge Date: Expected Discharge Date: 11/07/16  Team Members Present: Physician leading conference: Dr. Alger [name] Social Worker Present: Alexandria Alpers, LCSW Nurse Present: Alexandria [facility], RN PT Present: Alexandria [name], PT OT Present: Alexandria [name], OT SLP Present: Alexandria [name], SLP PPS Coordinator present : Alexandria Nakayama, RN, CRRN     Current Status/Progress Goal Weekly Team Focus  Medical   left brain abscess--on iv abx thru 5/7. mild language and cogntiive deficits. picc thrombus--removed--new central line placed  stabilize medically for discharge  iv abx, central line mgt,    Bowel/Bladder   Continent of bowel and bladder. Last BM 4/1.  Remain cotinent.   Assess for bowel and bladder function.   Swallow/Nutrition/ Hydration             ADL's   Supervision with BADLs and functional transfers   Mod I   Higher level cognition for IADLs, dual task processing during functional tasks, d/c planning, balance   Mobility   supervision overall  supervision/mod I overall  strength, balance, cognitive remediation, pt education    Communication   Supervision-Min A  Min A  communication with abstract thoughts    Safety/Cognition/ Behavioral Observations  Min A  Min A  high-level problem solving, attention, awareness    Pain   Denies pain.  Monitor for pain and pre medicate for therapy prn.  Assess pain q shift and prn.   Skin   Surgical incision to head with staples, OTA.   Monitor surgical site for infection and skin for any breakdown.  No skin breakdown/ infection while in rehab.    Rehab Goals Patient on target to meet rehab goals: Yes Rehab Goals Revised: stairs goal and community ambulation goal downgraded to supervision due to cognition *See Care Plan and progress notes for long and short-term goals.  Barriers to Discharge: needs iv abx at home    Possible Resolutions to Barriers:  home health/family ed/ central line for administration    Discharge Planning/Teaching Needs:  Pt to go to her home with her sister, dtr, and friends to provide 24/7 supervision.  Pt's family is here often during therapies, but formal family education can be arranged, if needed.  Therapists feel they have reviewed what they needed to with family.   Team Discussion:  Pt with left brain abscess and clot around PICC, so pt now has subclavian due to need for antibiotics until 12-08-16.  Pt to go on outing 11-05-16.  PT and OT will work on higher level and balance issues this week.  Dr. Naaman [name] stated that pt will have staples removed prior to d/c.  Revisions to  Treatment Plan:  none   Continued Need for Acute Rehabilitation Level of Care: The patient requires daily medical management by a physician with specialized training in physical medicine and rehabilitation for the following conditions: Daily direction of a multidisciplinary physical rehabilitation program to ensure safe treatment while eliciting the highest outcome that is of practical value to the patient.: Yes Daily medical management of patient stability for increased  activity during participation in an intensive rehabilitation regime.: Yes Daily analysis of laboratory values and/or radiology reports with any subsequent need for medication adjustment of medical intervention for : Post surgical problems;Other  Alexandria [name], Alexandria [name] 11/05/2016, 12:50 PM

## 2016-11-05 NOTE — Progress Notes (Signed)
Speech Language Pathology Daily Session Note  Patient Details  Name: Alexandria [name] MRN: [id number] Date of Birth: July 30, 1946  Today's Date: 11/05/2016 SLP Individual Time: 1445-1530 SLP Individual Time Calculation (min): 45 min  Short Term Goals: Week 1: SLP Short Term Goal 1 (Week 1): Pt will utilize word finding strategies to name 10 items within requested category with Min A verbal cues.  SLP Short Term Goal 2 (Week 1): Pt will self-monitor and self- correct expressive errors with Min A verbal cues.  SLP Short Term Goal 3 (Week 1): Pt will complete semi-complex problem solving tasks with Min A cues. SLP Short Term Goal 4 (Week 1): Pt will demonstrate alternating attention for ~ 30 minutes with Min A verbal cues.  SLP Short Term Goal 5 (Week 1): Pt will demonstrate anticipatory awareness by stating 3 tasks that she is safe to perform within her home environment.   Skilled Therapeutic Interventions: Skilled treatment session focused on cognition goals. SLP facilitated session by providing question cues for recall of previously learned game. Pt able to also learn novel game and complete semi-complex problem solving with previously learned and novel game with Min A to question cues. Pt demonstrated anticipatory awareness by discussing 3 tasks that would be safe to perform at home or within community. Pt able to effectively describe shopping trip earlier in the afternoon. Pt self-monitored and self-corrected verbal errors with supervision cues. Pt was returned to room, left in recliner and all needs within reach. Continue per current plan of care.      Function:    Cognition Comprehension Comprehension assist level: Understands complex 90% of the time/cues 10% of the time  Expression   Expression assist level: Expresses complex 90% of the time/cues < 10% of the time  Social Interaction Social Interaction assist level: Interacts appropriately 90% of the time - Needs monitoring or  encouragement for participation or interaction.  Problem Solving Problem solving assist level: Solves complex 90% of the time/cues < 10% of the time  Memory Memory assist level: Recognizes or recalls 90% of the time/requires cueing < 10% of the time    Pain    Therapy/Group: Individual Therapy   Jamela Cumbo B. Rutherford Nail, M.S., [place] 11/05/2016, 3:30 PM

## 2016-11-05 NOTE — Progress Notes (Signed)
Recreational Therapy Assessment and Plan  Patient Details  Name: Alexandria [name] MRN: [id number] Date of Birth: 02-09-1946 Today's Date: 11/05/2016  Rehab Potential: Good  ELOS: 7 days  Assessment  Problem List:      Patient Active Problem List   Diagnosis Date Noted  . Brain tumor ([place])   . Hyperglycemia 10/27/2016  . Acute encephalopathy 10/23/2016  . Hx of breast cancer 10/23/2016  . Essential hypertension 10/23/2016  . Hypokalemia 10/23/2016  . Brain abscess 10/22/2016    Past Medical History:      Past Medical History:  Diagnosis Date  . Cancer [facility]) 1996   breast cx  . Hypertension    Past Surgical History:  Past Surgical History:  Procedure Laterality Date  . ABDOMINAL HYSTERECTOMY    . APPLICATION OF CRANIAL NAVIGATION N/A 10/27/2016   Procedure: APPLICATION OF CRANIAL NAVIGATION;  Surgeon: Erline Levine, MD;  Location: [facility];  Service: Neurosurgery;  Laterality: N/A;  . CRANIOTOMY Left 10/27/2016   Procedure: CRANIOTOMY TUMOR EXCISION;  Surgeon: Erline Levine, MD;  Location: [facility];  Service: Neurosurgery;  Laterality: Left;  Marland Kitchen MASTECTOMY     right breast    Assessment & Plan Clinical Impression: Alexandria Nayak Revingtonis a 71 y.o.right handed femalewith history of hypertension, remote history of breast cancer 1996. Per chart review patient lives alone and was independent prior to admission. 2 level home with bedroom on main level. Family inarea toassist. Presented 10/22/2016 with altered mental status that progressed over the past few days. CT imaging demonstrated a mass like lesion in the left frontal lobe with surrounding vasogenic edema and localized mass effect question malignancy versus abscess. CT of abdomen and pelvis as well as CT of chest negative for acute findings. Neurosurgery consulted and underwent left craniotomy tumor excision 10/27/2016 per Dr. Vertell Limber. Infectious disease consulted with blood cultures showing Streptococcus  intermediusand presently on Intravenous ceftriaxone and oral metronidazoleanticipate 6 weeks of antibiotic care. Decadron protocol as directed.Keppra added for seizure prophylaxis.Tolerating a regular diet. Physical and occupational therapy evaluation completed with recommendations of physical medicine rehabilitation consult.Patient was admitted for a comprehensive rehabilitation program. Patient transferred to CIR on 10/30/2016.   Pt presents with decreased activity tolerance, decreased functional mobility, decreased balance, decreased memory, decreased processing, decreased safety awareness Limiting pt's independence with leisure/community pursuits.  Pt referred by team for participation in community reintegration & pt agreeable to participate in an outing scheduled today.  Plan Min 1 time for Community reintegration  Recommendations for other services: None   Discharge Criteria: Patient will be discharged from TR if patient refuses treatment 3 consecutive times without medical reason.  If treatment goals not met, if there is a change in medical status, if patient makes no progress towards goals or if patient is discharged from hospital.  The above assessment, treatment plan, treatment alternatives and goals were discussed and mutually agreed upon: by patient  Alexandria [name] 11/05/2016, 9:21 AM

## 2016-11-05 NOTE — Progress Notes (Signed)
Occupational Therapy Session Note  Patient Details  Name: Alexandria [name] MRN: [id number] Date of Birth: August 30, 1945  Today's Date: 11/05/2016 OT Individual Time: 1100-1200 OT Individual Time Calculation (min): 60 min    Short Term Goals: Week 1:  OT Short Term Goal 1 (Week 1): STGs=LTGs due to ELOS  Skilled Therapeutic Interventions/Progress Updates:    Treatment session with focus on ADL retraining and functional mobility.  Pt gathered all clothing prior to ambulating to room shower without assistance.  Bathing completed at sit > stand level in shower with good awareness of water control to avoid getting head wet and improved thoroughness.  Completed dressing in bathroom without assist.  Ambulated to family room to make a cup of coffee, pt able to sequence familiar task in unfamiliar setting.  Engaged in simulated walk-in shower transfer with stepping over 5" yoga block to simulate shower ledge.  Pt's son in law arrived mid-session with pt attempting to update him on medical status and visit from hospitalist, however unsure of specifics.  Therapy Documentation Precautions:  Precautions Precautions: Fall Restrictions Weight Bearing Restrictions: No Pain: Pain Assessment Pain Assessment: No/denies pain  See Function Navigator for Current Functional Status.   Therapy/Group: Individual Therapy  Simonne Come 11/05/2016, 12:09 PM

## 2016-11-05 NOTE — Progress Notes (Signed)
[facility] PHYSICAL MEDICINE & REHABILITATION     PROGRESS NOTE    Subjective/Complaints: In chair. Just finished breakfast. Left arm a little sore but otherwise doing well.   ROS: pt denies nausea, vomiting, diarrhea, cough, shortness of breath or chest pain    Objective: Vital Signs: Blood pressure 102/65, pulse (!) 54, temperature 97.9 F (36.6 C), temperature source Oral, resp. rate 18, height 5\' 2"  (1.575 m), weight 77.3 kg (170 lb 6.7 oz), SpO2 95 %. No results found. No results for input(s): WBC, HGB, HCT, PLT in the last 72 hours. No results for input(s): NA, K, CL, GLUCOSE, BUN, CREATININE, CALCIUM in the last 72 hours.  Invalid input(s): CO CBG (last 3)   Recent Labs  11/04/16 1632 11/04/16 2104 11/05/16 0631  GLUCAP 134* 152* 123*    Wt Readings from Last 3 Encounters:  11/05/16 77.3 kg (170 lb 6.7 oz)  10/23/16 74.9 kg (165 lb 2 oz)    Physical Exam:  HENT:  Craniotomy site clean and dry with sutures all in place Eyes: EOMI  Neck: Normal range of motion. Neck supple. No thyromegaly present.  Cardiovascular: RRR.   Respiratory: CTA B GI: Soft. Bowel sounds are normal. She exhibits no distension.  Neurological: She is alert.   Follows simple commands. Functional communication. Speech clear. Language generally fluent Motor strength is 5/5 bilateral deltoid, bicep, tricep grip. 4+/5 Bilateral hip flexor, knee extensor, ankle dorsiflexor. Fair insight and awareness.  Psych: pleasant and appropriate EXT-  ecchymotic areas Left forearm with mild swelling left forearm trace edema dorsum left hand. Left upper arm a little sore with palpation  Assessment/Plan: 1. 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 Physiatrist is providing close team supervision and 24 hour management of active medical problems listed below. Physiatrist  and rehab team continue to assess barriers to discharge/monitor patient progress toward functional and medical goals.  Function:  Bathing Bathing position   Position: Shower  Bathing parts Body parts bathed by patient: Right arm, Left arm, Chest, Abdomen, Front perineal area, Buttocks, Right upper leg, Left upper leg, Right lower leg, Left lower leg, Back    Bathing assist Assist Level: Supervision or verbal cues      Upper Body Dressing/Undressing Upper body dressing   What is the patient wearing?: Pull over shirt/dress, Bra Bra - Perfomed by patient: Thread/unthread right bra strap, Thread/unthread left bra strap, Hook/unhook bra (pull down sports bra)   Pull over shirt/dress - Perfomed by patient: Thread/unthread right sleeve, Thread/unthread left sleeve, Put head through opening, Pull shirt over trunk   Button up shirt - Perfomed by patient: Thread/unthread right sleeve, Thread/unthread left sleeve, Pull shirt around back, Button/unbutton shirt      Upper body assist Assist Level: More than reasonable time      Lower Body Dressing/Undressing Lower body dressing   What is the patient wearing?: Underwear, Pants, Socks, Shoes Underwear - Performed by patient: Thread/unthread right underwear leg, Thread/unthread left underwear leg, Pull underwear up/down   Pants- Performed by patient: Thread/unthread right pants leg, Thread/unthread left pants leg, Pull pants up/down, Fasten/unfasten pants       Socks - Performed by patient: Don/doff right sock, Don/doff left sock   Shoes - Performed by patient: Don/doff right shoe, Don/doff left shoe, Fasten right, Fasten left Shoes - Performed by helper: Don/doff left shoe  Lower body assist Assist for lower body dressing: More than reasonable time      Toileting Toileting Toileting activity did not occur: No continent bowel/bladder event Toileting steps completed by patient: Adjust clothing prior to toileting, Performs  perineal hygiene, Adjust clothing after toileting   Toileting Assistive Devices: Grab bar or rail  Toileting assist Assist level: Supervision or verbal cues   Transfers Chair/bed transfer   Chair/bed transfer method: Ambulatory Chair/bed transfer assist level: Supervision or verbal cues Chair/bed transfer assistive device: Armrests     Locomotion Ambulation     Max distance: >1000 ft Assist level: Supervision or verbal cues   Wheelchair          Cognition Comprehension Comprehension assist level: Understands complex 90% of the time/cues 10% of the time  Expression Expression assist level: Expresses complex 90% of the time/cues < 10% of the time  Social Interaction Social Interaction assist level: Interacts appropriately 90% of the time - Needs monitoring or encouragement for participation or interaction.  Problem Solving Problem solving assist level: Solves complex 90% of the time/cues < 10% of the time  Memory Memory assist level: Recognizes or recalls 90% of the time/requires cueing < 10% of the time   Medical Problem List and Plan: 1.  Decreased functional mobility with cognitive deficits secondary to Left frontal lobe brain abscess  -CIR PT, OT SLP  -progressing toward goals. Outing today 2.  DVT Prophylaxis/left subclavian DVT:  -PICC removed, tunneled Right CVC placedy by INR---improving pain at former picc site  -Pt is not an anticoagulation candidate per NS 3. Pain Management: Hydrocodone as needed 4. Mood: Xanax 0.125-0.25 mg 2 at bedtime as needed 5. Neuropsych: This patient is capable of making decisions on her own behalf. 6. Skin/Wound Care: Routine skin checks,  7. Fluids/Electrolytes/Nutrition: encourage po intake   8. ID/Streptococcus intermedius. Intravenous ceftriaxone and oral Flagyl 6 weeks ending 12/08/2016 and follow-up per infectious disease   -wbc's 6.8  -recheck labs tomorrow 9. Seizure prophylaxis. Keppra 500 mg twice a day no seizure  activity 10. Hyperglycemia secondary to Decadron. Monitor with decrease in steroids--sugars are normal  -dc cbg's 11. Hypertension. Toprol-XL 100 mg daily, Lotensin 20 mg daily, HCTZ 25 mg daily  -bp under tight control 12.. Constipation. Laxative assistance    LOS (Days) 6 A FACE TO FACE EVALUATION WAS PERFORMED  Meredith Staggers, MD 11/05/2016 8:57 AM

## 2016-11-05 NOTE — Consult Note (Signed)
INFECTIOUS DISEASE ATTENDING ADDENDUM:   Date: 11/05/2016  Patient name: Alexandria [name]  Medical record number: [id number]  Date of birth: 24-Jun-1946    This patient has been seen and discussed with the clinical  pharmacy staff. Please see the pharmacy student's note for complete details. I concur with their findings with the following additions/corrections:  Mrs All was recently admitted with brain mass and underwent her neuro surgical resection by Dr. Vertell Limber. In the operating room Dr. Vertell Limber encountered grossly purulent material on the surface of the brain. This was discoloring the local meninges and thickening surrounding meninges. The "brain mass was resected and at least 6 mL of frank pus removed. The lining was removed and sent to pathology and microbiology. Cultures ultimately grew Streptococcus intermedius along with BETA LACTAMASE NEGATIVE  Fusobacterium. She has been on ceftriaxone and metronidazole. Patient has been doing well and progressing well on rehabilitation floor.  Her pathology showed findings consistent with necrotic material on abscess but also there was an atypical infiltrate that was not what is typically seen with pure reaction to infection. I was called by PMR with concerns that had been voiced to Dr. Vertell Limber re possible EBV infection. I have since discussed the case with Dr. Gari Crown from Pathology and he had NOT done special stains for EBV but had raised the possibility of EBV related lymphoproliferative process. The patient did have EBV serologies with [place] IgG +, IgM negative, EA +, NA - which is serologically more consistent with prior infection rather than reactivation and certainly not consistent with acute infection.  I do agree that the patient should have repeat imaging to ensure that her abscess is resolving in approximately a month and to reassess for the potential that the patient may have a lymphoproliferative or neoplastic process also involved  that was not able to be identified on pathology examination.  I do not see any role for antiviral therapy here where we have not established any evidence of active EBV infection and where we have an organism that is not especially sensitive to antiviral agents and were such agents have high degree of toxicity compared to the potential therapeutic abilities with this organism.  12 point review of systems is as above and otherwise negative.  Past medical past surgical family and social history medications and allergies have been reviewed and are documented in the pharmacy student's note  Exam:  Vitals:   11/05/16 0500 11/05/16 1529  BP: 102/65 133/74  Pulse: (!) 54 65  Resp: 18 18  Temp: 97.9 F (36.6 C) 97.7 F (36.5 C)   Gen. quite pleasant lady in no acute distress alert oriented 3  HEENT: Craniotomy site is clean dry and intact, pupils equal round reactive to light extraocular movement intact sclera anicteric oropharynx is clear  Cardiovascular exam regular rate and rhythm no murmurs gallops rubs  Pulmonary no wheezes or respiratory distress  Abdomen soft nondistended nontender  Extremities no edema  Neurological exam nonfocal  Skin no rashes  I/R:  Continue Ceftriaxone and flagyl for Brain abscess with CTX at 2 g IV q 12. Metronidazole can be increased to 500mg  Q 6 if tolerated by the patient (though the fusobacterium was beta lactamase negative there may be other anerobes present that are beta lactamase + that did not grow)  Repeat MRI in one month  I will relay this all fo the patient tomorrow as we had not reviewed with Pathology when we rounded earlier today.  I spent  greater than 80  minutes with the patient including greater than 50% of time in face to face counsel of the patient re her brain abscess, the atypical infiltrate seen on pathology and in coordination of her care with pathology department and in discussion with Dr. Gari Crown.      Alcide Evener 11/05/2016, 6:49 PM

## 2016-11-05 NOTE — Progress Notes (Signed)
Recreational Therapy Session Note  Patient Details  Name: Alexandria [name] MRN: [id number] Date of Birth: 23-Jan-1946 Today's Date: 11/05/2016  Pain: no c/o Skilled Therapeutic Interventions/Progress Updates: Pt participated in community reintegration/outing to the grocery store at overall supervision ambulatory level without and assistive device.  Goals focused on safe community mobility, path-finding, money management,  identification & negotiation of obstacles, energy conservation techniques/education.  See outing goal sheet in shadow chart for full details.

## 2016-11-05 NOTE — Progress Notes (Signed)
Physical Therapy Session Note  Patient Details  Name: Alexandria [name] MRN: [id number] Date of Birth: Sep 01, 1945  Today's Date: 11/05/2016 PT Individual Time: 0900-0959 PT Individual Time Calculation (min): 59 min   Short Term Goals: Week 1:  PT Short Term Goal 1 (Week 1): =LTG due to estimated LOS  Skilled Therapeutic Interventions/Progress Updates:    Pt in room sitting in chair, agreeable to PT. Independent with donning socks and shoes. Gait: ambulating throughout unit, working on head turns for balance and scanning tasks. Pt able to ambulate 1000+ ft without device or seated rest. No loss of balance with head rotations. Pt able to pathfind back to room without cues, using signage to assist. Balance: static standing with eyes open/closed with progression to standing on foam. Assist needed with standing balance on foam and eyes closed. Heel toe static>dynamic with min assist. Step-ups performed with single step forward X20, lateral Lt/Rt each 1X10. Nu-step L4 X 76min. Pt returned to room following session, up in chair with all needs in reach.   Therapy Documentation Precautions:  Precautions Precautions: Fall Restrictions Weight Bearing Restrictions: No   Pain:  Denies having pain.   See Function Navigator for Current Functional Status.   Therapy/Group: Individual Therapy  Linard Millers, PT 11/05/2016, 9:51 AM

## 2016-11-06 ENCOUNTER — Other Ambulatory Visit: Payer: Self-pay

## 2016-11-06 ENCOUNTER — Inpatient Hospital Stay (HOSPITAL_COMMUNITY): Payer: Medicare Other | Admitting: Speech Pathology

## 2016-11-06 ENCOUNTER — Inpatient Hospital Stay (HOSPITAL_COMMUNITY): Payer: Medicare Other

## 2016-11-06 ENCOUNTER — Inpatient Hospital Stay (HOSPITAL_COMMUNITY): Payer: Medicare Other | Admitting: Occupational Therapy

## 2016-11-06 DIAGNOSIS — E871 Hypo-osmolality and hyponatremia: Secondary | ICD-10-CM

## 2016-11-06 DIAGNOSIS — B2790 Infectious mononucleosis, unspecified without complication: Secondary | ICD-10-CM

## 2016-11-06 DIAGNOSIS — E876 Hypokalemia: Secondary | ICD-10-CM

## 2016-11-06 DIAGNOSIS — D72820 Lymphocytosis (symptomatic): Secondary | ICD-10-CM

## 2016-11-06 LAB — BASIC METABOLIC PANEL
Anion gap: 9 (ref 5–15)
BUN: 20 mg/dL (ref 6–20)
CHLORIDE: 98 mmol/L — AB (ref 101–111)
CO2: 27 mmol/L (ref 22–32)
CREATININE: 0.75 mg/dL (ref 0.44–1.00)
Calcium: 8.6 mg/dL — ABNORMAL LOW (ref 8.9–10.3)
Glucose, Bld: 141 mg/dL — ABNORMAL HIGH (ref 65–99)
POTASSIUM: 3 mmol/L — AB (ref 3.5–5.1)
Sodium: 134 mmol/L — ABNORMAL LOW (ref 135–145)

## 2016-11-06 LAB — CBC
HCT: 36.9 % (ref 36.0–46.0)
HEMOGLOBIN: 12.5 g/dL (ref 12.0–15.0)
MCH: 29.6 pg (ref 26.0–34.0)
MCHC: 33.9 g/dL (ref 30.0–36.0)
MCV: 87.4 fL (ref 78.0–100.0)
PLATELETS: 144 10*3/uL — AB (ref 150–400)
RBC: 4.22 MIL/uL (ref 3.87–5.11)
RDW: 13.2 % (ref 11.5–15.5)
WBC: 7.7 10*3/uL (ref 4.0–10.5)

## 2016-11-06 LAB — GLUCOSE, CAPILLARY
GLUCOSE-CAPILLARY: 135 mg/dL — AB (ref 65–99)
GLUCOSE-CAPILLARY: 164 mg/dL — AB (ref 65–99)
GLUCOSE-CAPILLARY: 284 mg/dL — AB (ref 65–99)
GLUCOSE-CAPILLARY: 154 mg/dL — AB (ref 65–99)

## 2016-11-06 MED ORDER — HYDROCHLOROTHIAZIDE 12.5 MG PO CAPS: 12.5000 mg | ORAL_CAPSULE | Freq: Every day | ORAL | Status: DC

## 2016-11-06 MED ORDER — DEXAMETHASONE 2 MG PO TABS
2.0000 mg | ORAL_TABLET | Freq: Two times a day (BID) | ORAL | Status: DC
  Administered 2016-11-06 – 2016-11-07 (×2): 2 mg via ORAL
  Filled 2016-11-06 (×2): qty 1

## 2016-11-06 MED ORDER — BENAZEPRIL HCL 20 MG PO TABS
20.0000 mg | ORAL_TABLET | Freq: Every day | ORAL | Status: DC
Start: 2016-11-07 — End: 2016-11-07
  Administered 2016-11-07: 20 mg via ORAL
  Filled 2016-11-06: qty 1

## 2016-11-06 MED ORDER — SODIUM CHLORIDE 0.9 % IV SOLN: INTRAVENOUS | Status: DC

## 2016-11-06 MED ORDER — POTASSIUM CHLORIDE 2 MEQ/ML IV SOLN
INTRAVENOUS | Status: AC
  Administered 2016-11-06: 12:00:00 via INTRAVENOUS
  Filled 2016-11-06 (×2): qty 1000

## 2016-11-06 MED ORDER — POTASSIUM CHLORIDE CRYS ER 20 MEQ PO TBCR
20.0000 meq | EXTENDED_RELEASE_TABLET | Freq: Two times a day (BID) | ORAL | Status: DC
  Administered 2016-11-06 – 2016-11-07 (×3): 20 meq via ORAL
  Filled 2016-11-06 (×3): qty 1

## 2016-11-06 MED ORDER — METOPROLOL SUCCINATE ER 50 MG PO TB24
50.0000 mg | ORAL_TABLET | Freq: Every evening | ORAL | Status: DC
  Administered 2016-11-06: 50 mg via ORAL
  Filled 2016-11-06: qty 1

## 2016-11-06 NOTE — Progress Notes (Addendum)
[facility] PHYSICAL MEDICINE & REHABILITATION     PROGRESS NOTE    Subjective/Complaints: No new issues. Outing went well. Denies any new pain. LUE swelling better  ROS: pt denies nausea, vomiting, diarrhea, cough, shortness of breath or chest pain   Objective: Vital Signs: Blood pressure 104/70, pulse 62, temperature 97.7 F (36.5 C), temperature source Oral, resp. rate 18, height 5\' 2"  (1.575 m), weight 77.3 kg (170 lb 6.7 oz), SpO2 97 %. No results found.  Recent Labs  11/06/16 0458  WBC 7.7  HGB 12.5  HCT 36.9  PLT 144*    Recent Labs  11/06/16 0458  NA 134*  K 3.0*  CL 98*  GLUCOSE 141*  BUN 20  CREATININE 0.75  CALCIUM 8.6*   CBG (last 3)   Recent Labs  11/05/16 1631 11/05/16 2112 11/06/16 0636  GLUCAP 133* 115* 135*    Wt Readings from Last 3 Encounters:  11/05/16 77.3 kg (170 lb 6.7 oz)  10/23/16 74.9 kg (165 lb 2 oz)    Physical Exam:  HENT:  Craniotomy site clean and dry with sutures all in place Eyes: EOMI  Neck: Normal range of motion. Neck supple. No thyromegaly present.  Cardiovascular: RRR   Respiratory: CTA B GI: Soft. Bowel sounds are normal. She exhibits no distension.  Neurological: She is alert.   Follows simple commands. Functional communication. Speech clear. Language generally fluent Motor strength is 5/5 bilateral deltoid, bicep, tricep grip. 4+/5 Bilateral hip flexor, knee extensor, ankle dorsiflexor. normal insight and awareness.  Psych: pleasant and appropriate Ext: LUE with decreased swelling and ecchymoses   Assessment/Plan: 1. 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 Physiatrist is providing close team supervision and 24 hour management of active medical problems listed below. Physiatrist and rehab team continue to assess barriers to discharge/monitor patient progress toward  functional and medical goals.  Function:  Bathing Bathing position   Position: Shower  Bathing parts Body parts bathed by patient: Right arm, Left arm, Chest, Abdomen, Front perineal area, Buttocks, Right upper leg, Left upper leg, Right lower leg, Left lower leg, Back    Bathing assist Assist Level: More than reasonable time      Upper Body Dressing/Undressing Upper body dressing   What is the patient wearing?: Pull over shirt/dress, Bra Bra - Perfomed by patient: Thread/unthread right bra strap, Thread/unthread left bra strap, Hook/unhook bra (pull down sports bra)   Pull over shirt/dress - Perfomed by patient: Thread/unthread right sleeve, Thread/unthread left sleeve, Put head through opening, Pull shirt over trunk   Button up shirt - Perfomed by patient: Thread/unthread right sleeve, Thread/unthread left sleeve, Pull shirt around back, Button/unbutton shirt      Upper body assist Assist Level: More than reasonable time      Lower Body Dressing/Undressing Lower body dressing   What is the patient wearing?: Underwear, Pants, Socks, Shoes Underwear - Performed by patient: Thread/unthread right underwear leg, Thread/unthread left underwear leg, Pull underwear up/down   Pants- Performed by patient: Thread/unthread right pants leg, Thread/unthread left pants leg, Pull pants up/down, Fasten/unfasten pants       Socks - Performed by patient: Don/doff right sock, Don/doff left sock   Shoes - Performed by patient: Don/doff right shoe, Don/doff left shoe, Fasten right, Fasten left Shoes - Performed by helper: Don/doff left shoe          Lower body assist Assist  for lower body dressing: More than reasonable time      Toileting Toileting Toileting activity did not occur: No continent bowel/bladder event Toileting steps completed by patient: Adjust clothing prior to toileting, Performs perineal hygiene, Adjust clothing after toileting   Toileting Assistive Devices: Grab bar or  rail  Toileting assist Assist level: Supervision or verbal cues   Transfers Chair/bed transfer   Chair/bed transfer method: Ambulatory Chair/bed transfer assist level: Supervision or verbal cues Chair/bed transfer assistive device: Armrests     Locomotion Ambulation     Max distance: >1000 ft Assist level: Supervision or verbal cues   Wheelchair          Cognition Comprehension Comprehension assist level: Understands complex 90% of the time/cues 10% of the time  Expression Expression assist level: Expresses complex 90% of the time/cues < 10% of the time  Social Interaction Social Interaction assist level: Interacts appropriately 90% of the time - Needs monitoring or encouragement for participation or interaction.  Problem Solving Problem solving assist level: Solves complex 90% of the time/cues < 10% of the time  Memory Memory assist level: Recognizes or recalls 90% of the time/requires cueing < 10% of the time   Medical Problem List and Plan: 1.  Decreased functional mobility with cognitive deficits secondary to Left frontal lobe brain abscess  -CIR PT, OT SLP  -ELOS 4/6 2.  DVT Prophylaxis/left subclavian DVT:  -PICC removed, tunneled Right CVC placedy by INR---improving pain at former picc site  -Pt is not an anticoagulation candidate per NS 3. Pain Management: Hydrocodone as needed 4. Mood: Xanax 0.125-0.25 mg 2 at bedtime as needed 5. Neuropsych: This patient is capable of making decisions on her own behalf. 6. Skin/Wound Care: Routine skin checks,   -remove staples today 7. Fluids/Electrolytes/Nutrition: encourage po intake  -hypokalemic--supplement  -mild hyponatremia---likely d/t hctz  8. ID/Streptococcus intermedius. Intravenous ceftriaxone and oral Flagyl 6 weeks ending 12/08/2016 and follow-up per infectious disease   -wbc's 7.7 today  -keep an eye on platelets, 144k today  -Dr. Tommy Medal concerned by atypical infiltrate on path (?lymphoproliferative vs  neoplastic process)   -f/u MRI in a month 9. Seizure prophylaxis. Keppra 500 mg twice a day no seizure activity 10. Hyperglycemia secondary to Decadron. Monitor with decrease in steroids--sugars are normal  -dc cbg's 11. Hypertension. Toprol-XL 100 mg daily, Lotensin 20 mg daily, HCTZ 25 mg daily  -bp under tight control 12.. Constipation. Laxative assistance    LOS (Days) 7 A FACE TO FACE EVALUATION WAS PERFORMED  Meredith Staggers, MD 11/06/2016 8:32 AM

## 2016-11-06 NOTE — Progress Notes (Addendum)
Patient was in kitchen when she looked strange, eyes rolled out and she was lowered to the floor as she passed out. Episode lasted few seconds --she was conversant and had another episode when attempting to sit up. Second episode brief seconds. No reports of chest pain or SOB. She was conversant and at baseline neurologically. No B/B incontinence.   Was noted to be damp and diaphoretic--likely vasovagal episode. On multiple BP med's with hypokalemia. Will start IVF and EKG done revealing sinus brady. Will decrease Lisinopril/HCTZ. Also decrease metoprolol this evening and recheck EKG in am. Will order orthostatic BP.  Labs ordered for am.   Daughter updated--she expressed concerns about discharge tomorrow and relayed that we will monitor patent today and tomorrow am to see how she does.

## 2016-11-06 NOTE — Progress Notes (Signed)
Subjective:  Syncopal event likely due to orthostasis in context of blood pressure meds potential vasovagal   Antibiotics:  Anti-infectives    Start     Dose/Rate Route Frequency Ordered Stop   11/06/16 0000  metroNIDAZOLE (FLAGYL) tablet 500 mg  Status:  Discontinued     500 mg Oral Every 6 hours 11/05/16 1901 11/05/16 1944   11/05/16 2200  metroNIDAZOLE (FLAGYL) tablet 500 mg     500 mg Oral Every 6 hours 11/05/16 1944     10/31/16 0115  metroNIDAZOLE (FLAGYL) tablet 500 mg  Status:  Discontinued     500 mg Oral Every 8 hours 10/30/16 1908 11/05/16 1901   10/30/16 2000  cefTRIAXone (ROCEPHIN) 2 g in dextrose 5 % 50 mL IVPB     2 g 100 mL/hr over 30 Minutes Intravenous Every 12 hours 10/30/16 1908        Medications: Scheduled Meds: . [START ON 11/07/2016] benazepril  20 mg Oral Daily  . cefTRIAXone (ROCEPHIN)  IV  2 g Intravenous Q12H  . dexamethasone  2 mg Oral Q12H  . insulin aspart  0-9 Units Subcutaneous TID WC  . levETIRAcetam  500 mg Oral BID  . metoprolol succinate  50 mg Oral QPM  . metroNIDAZOLE  500 mg Oral Q6H  . pantoprazole  40 mg Oral Daily  . potassium chloride  20 mEq Oral BID  . saccharomyces boulardii  250 mg Oral BID   Continuous Infusions: . sodium chloride 0.9 % 1,000 mL with potassium chloride 40 mEq infusion 100 mL/hr at 11/06/16 1146   PRN Meds:.acetaminophen **OR** acetaminophen, ALPRAZolam, bisacodyl, HYDROcodone-acetaminophen, ondansetron **OR** ondansetron (ZOFRAN) IV, sodium chloride flush, sorbitol    Objective: Weight change:   Intake/Output Summary (Last 24 hours) at 11/06/16 1711 Last data filed at 11/06/16 1300  Gross per 24 hour  Intake              360 ml  Output                0 ml  Net              360 ml   Blood pressure 109/66, pulse (!) 57, temperature 97.7 F (36.5 C), resp. rate 18, height 5\' 2"  (1.575 m), weight 170 lb 6.7 oz (77.3 kg), SpO2 94 %. Temp:  [97.7 F (36.5 C)] 97.7 F (36.5 C) (04/05  1044) Pulse Rate:  [57-62] 57 (04/05 1044) Resp:  [18] 18 (04/05 0506) BP: (104-109)/(66-70) 109/66 (04/05 1044) SpO2:  [94 %-97 %] 94 % (04/05 1044)  Physical Exam: General: Alert and awake, oriented x3, not in any acute distress. HEENT: anicteric sclera,, EOMI, craniotomy scar is clean CVS regular rate, normal r,  no murmur rubs or gallops Chest: clear to auscultation bilaterally, no wheezing, rales or rhonchi Abdomen: soft nontender, nondistended, normal bowel sounds, Extremities: no  clubbing or edema noted bilaterally Skin: no rashes Neuro: nonfocal  CBC:  CBC Latest Ref Rng & Units 11/06/2016 10/31/2016 10/28/2016  WBC 4.0 - 10.5 K/uL 7.7 6.8 9.2  Hemoglobin 12.0 - 15.0 g/dL 12.5 12.8 12.8  Hematocrit 36.0 - 46.0 % 36.9 39.2 38.9  Platelets 150 - 400 K/uL 144(L) 165 188      BMET  Recent Labs  11/06/16 0458  NA 134*  K 3.0*  CL 98*  CO2 27  GLUCOSE 141*  BUN 20  CREATININE 0.75  CALCIUM 8.6*     Liver Panel  No results  for input(s): PROT, ALBUMIN, AST, ALT, ALKPHOS, BILITOT, BILIDIR, IBILI in the last 72 hours.     Sedimentation Rate No results for input(s): ESRSEDRATE in the last 72 hours. C-Reactive Protein No results for input(s): CRP in the last 72 hours.  Micro Results: Recent Results (from the past 720 hour(s))  Culture, blood (x 2)     Status: None   Collection Time: 10/23/16  2:55 AM  Result Value Ref Range Status   Specimen Description BLOOD LEFT ANTECUBITAL  Final   Special Requests BOTTLES DRAWN AEROBIC AND ANAEROBIC 10CC EACH  Final   Culture NO GROWTH 5 DAYS  Final   Report Status 10/28/2016 FINAL  Final  Culture, blood (x 2)     Status: None   Collection Time: 10/23/16  3:04 AM  Result Value Ref Range Status   Specimen Description BLOOD LEFT HAND  Final   Special Requests BOTTLES DRAWN AEROBIC AND ANAEROBIC 10CC EACH  Final   Culture NO GROWTH 5 DAYS  Final   Report Status 10/28/2016 FINAL  Final  Culture, Urine     Status:  Abnormal   Collection Time: 10/24/16  8:25 AM  Result Value Ref Range Status   Specimen Description URINE, RANDOM  Final   Special Requests NONE  Final   Culture MULTIPLE SPECIES PRESENT, SUGGEST RECOLLECTION (A)  Final   Report Status 10/25/2016 FINAL  Final  MRSA PCR Screening     Status: None   Collection Time: 10/25/16  3:29 PM  Result Value Ref Range Status   MRSA by PCR NEGATIVE NEGATIVE Final    Comment:        The GeneXpert MRSA Assay (FDA approved for NASAL specimens only), is one component of a comprehensive MRSA colonization surveillance program. It is not intended to diagnose MRSA infection nor to guide or monitor treatment for MRSA infections.   Aerobic/Anaerobic Culture (surgical/deep wound)     Status: None   Collection Time: 10/27/16  8:48 AM  Result Value Ref Range Status   Specimen Description ABSCESS  Final   Special Requests LEFT FRONTAL LOBE BRAIN MASS  Final   Gram Stain   Final    ABUNDANT WBC PRESENT, PREDOMINANTLY PMN ABUNDANT GRAM POSITIVE COCCI IN PAIRS IN CHAINS CRITICAL RESULT CALLED TO, READ BACK BY AND VERIFIED WITH: Valarie Merino AT 9604 10/27/16 BY L BENFIELD    Culture   Final    ABUNDANT STREPTOCOCCUS INTERMEDIUS MODERATE FUSOBACTERIUM NUCLEATUM BETA LACTAMASE NEGATIVE    Report Status 11/01/2016 FINAL  Final   Organism ID, Bacteria STREPTOCOCCUS INTERMEDIUS  Final      Susceptibility   Streptococcus intermedius - MIC*    PENICILLIN <=0.06 SENSITIVE Sensitive     CEFTRIAXONE 0.25 SENSITIVE Sensitive     ERYTHROMYCIN >=8 RESISTANT Resistant     LEVOFLOXACIN 1 SENSITIVE Sensitive     VANCOMYCIN 0.5 SENSITIVE Sensitive     * ABUNDANT STREPTOCOCCUS INTERMEDIUS  Aerobic/Anaerobic Culture (surgical/deep wound)     Status: None   Collection Time: 10/27/16  9:14 AM  Result Value Ref Range Status   Specimen Description TISSUE  Final   Special Requests LEFT FRONTAL LOBE BRAIN MASS  Final   Gram Stain   Final    MODERATE WBC PRESENT,BOTH PMN AND  MONONUCLEAR RARE GRAM POSITIVE COCCI IN PAIRS CRITICAL RESULT CALLED TO, READ BACK BY AND VERIFIED WITH: P LOPEZ,RN AT 1013 10/27/16 BY L BENFIELD    Culture   Final    FEW STREPTOCOCCUS INTERMEDIUS SUSCEPTIBILITIES PERFORMED ON  PREVIOUS CULTURE WITHIN THE LAST 5 DAYS. NO ANAEROBES ISOLATED    Report Status 11/01/2016 FINAL  Final    Studies/Results: No results found.    Assessment/Plan:  INTERVAL HISTORY: syncopal episode   Active Problems:   Brain abscess   Chronic EBV infection   Lymphoproliferative disease (HCC)   Streptococcus infection   Anaerobic bacterial infection   Fusobacterium infection    Alexandria [name] is a 71 y.o. female with  Brain mass with polymicrobial infection, atypical infiltrate on pathology  #1 Brain abscess: continue ceftriaxone and metronidazole (latter at higher dose of every 6 ) with duration and stop date as per Dr. Hale Bogus note on 10/30/2016  Discharge antibiotics:IV ceftriaxone and oral metronidazole Per pharmacy protocol Ceftriaxone  Duration: 6 weeks End Date: 12/08/2016  [facility] Care Per Protocol:  Labs weekly while on IV antibiotics: _x_ CBC with differential _x_ BMP   __ Please pull PIC at completion of IV antibiotics _x_ Please leave PIC in place until doctor has seen patient or been notified  Fax weekly labs to [phone]  Patient has followup with Dr. Megan Salon  Agree with repeat MRI roughly one month from prior MRI  #2  atypical lymphocytes: I suspect these truly are reactive despite him not having characteristic features. I agree with getting an MRI though to be thorough to make sure there is nothing suspicious for malignancy.  #3 Syncope: Related to continuation of blood pressure medications in the context of her illness and potential vasovagal reaction no evidence of this is due to an reemergence of an infection  I will sign off for now please call with further questions.     LOS: 7 days    Alcide Evener 11/06/2016, 5:11 PM

## 2016-11-06 NOTE — Progress Notes (Signed)
Speech Language Pathology Session Note & Discharge Summary  Patient Details  Name: Alexandria [name] MRN: [id number] Date of Birth: Oct 23, 1945  Today's Date: 11/06/2016 SLP Individual Time: 0830-0930 SLP Individual Time Calculation (min): 60 min   Skilled Therapeutic Interventions: Skilled treatment session focused on cognitive-linguistic goals. Patient was administered the MoCA (version 7.2). Patient scored 26/30 points with a score 26 or above considered normal. Patient demonstrated deficits in the areas of short-term recall and complex math. SLP also facilitated session by providing education in regards to memory compensatory strategies and how to implement strategies at discharge. She verbalized understanding of all information. Patient left upright in recliner with all needs within reach. Continue with current plan of care.   Patient has met 4 of 4 long term goals.  Patient to discharge at overall Supervision level.   Reasons goals not met: N/A   Clinical Impression/Discharge Summary: Patient made excellent gains and has met 4 of 4 LTG's this admission due to improved cognitive-linguistic function. Currently, patient requires overall supervision-Min A to complete functional and mildly complex tasks safely in regards to problem solving, recall, and attention. Patient is also supervision-Mod I for expression of wants/needs and for verbal expression of daily information. Patient education is complete and patient will discharge home with supervision from family. Patient would benefit from f/u SLP services to maximize cognitive-linguistic function and overall functional independence.   Care Partner:  Caregiver Able to Provide Assistance: Yes  Type of Caregiver Assistance: Cognitive  Recommendation:  Outpatient SLP 24 hour supervision  Rationale for SLP Follow Up: Maximize cognitive function and independence   Equipment: N/A   Reasons for discharge: Treatment goals met;Discharged from  hospital   Patient/Family Agrees with Progress Made and Goals Achieved: Yes   Function:  Cognition Comprehension Comprehension assist level: Understands complex 90% of the time/cues 10% of the time  Expression   Expression assist level: Expresses complex 90% of the time/cues < 10% of the time  Social Interaction Social Interaction assist level: Interacts appropriately with others with medication or extra time (anti-anxiety, antidepressant).  Problem Solving Problem solving assist level: Solves complex 90% of the time/cues < 10% of the time  Memory Memory assist level: Recognizes or recalls 90% of the time/requires cueing < 10% of the time   Cadi Rhinehart 11/06/2016, 3:44 PM

## 2016-11-06 NOTE — Progress Notes (Signed)
         [facility]    Date of Admission:  10/30/2016   Total days of antibiotics 11        Day 11 Ceftriaxone        Day 11 Metronidazole             Reason for Consult: Brain abscess     Active Problems:   Brain abscess   Chronic EBV infection   Lymphoproliferative disease (HCC)   Streptococcus infection   Anaerobic bacterial infection   Fusobacterium infection   . benazepril  40 mg Oral Daily   And  . hydrochlorothiazide  25 mg Oral Daily  . cefTRIAXone (ROCEPHIN)  IV  2 g Intravenous Q12H  . dexamethasone  2 mg Oral Q12H  . insulin aspart  0-9 Units Subcutaneous TID WC  . levETIRAcetam  500 mg Oral BID  . metoprolol succinate  100 mg Oral QPM  . metroNIDAZOLE  500 mg Oral Q6H  . pantoprazole  40 mg Oral Daily  . potassium chloride  20 mEq Oral BID  . saccharomyces boulardii  250 mg Oral BID    Recommendations: 1. Continue Ceftriaxone as scheduled. Increased frequency of metronidazole 500 mg to q6h to improve penetration to the brain abscess site.  2. Monitor patient for tolerability of increased antibiotic regimen, such as headache or nausea.       Assessment: Patient states she is feeling good today. She was sitting in her recliner when I saw her early this morning and looked well. She denied any discomfort or fever and was engaged in conversation. Per OT's note, patient had 2 episodes of possible syncope later that morning. Of note, patient's BP has been soft during her stay with HR in the 50-60s. Patient's primary team will adjust her BP medications and manage the patient as necessary. Patient was scheduled to go home on 4/6, but will likely reassess tomorrow AM to see if she will be stable at home.    HPI: Alexandria [name] is a 71 y.o. female admitted for a 7-day rehab course s/p left craniotomy to remove a left frontal-temporal lobe abscess/mass. She was started on ceftriaxone and metronidazole for antibiotic coverage. Has been  progressing well with her rehab.     Lab Results Lab Results  Component Value Date   WBC 7.7 11/06/2016   HGB 12.5 11/06/2016   HCT 36.9 11/06/2016   MCV 87.4 11/06/2016   PLT 144 (L) 11/06/2016    Lab Results  Component Value Date   CREATININE 0.75 11/06/2016   BUN 20 11/06/2016   NA 134 (L) 11/06/2016   K 3.0 (L) 11/06/2016   CL 98 (L) 11/06/2016   CO2 27 11/06/2016    Lab Results  Component Value Date   ALT 23 10/31/2016   AST 16 10/31/2016   ALKPHOS 42 10/31/2016   BILITOT 0.7 10/31/2016     Microbiology: No results found for this or any previous visit (from the past 240 hour(s)).  Marylou Flesher, PharmD Candidate 11/06/2016, 10:22 AM

## 2016-11-06 NOTE — Progress Notes (Signed)
Physical Therapy Discharge Summary  Patient Details  Name: Alexandria [name] MRN: [id number] Date of Birth: [birth year]  Today's Date: 11/07/2016  Patient has met 81 of 11 long term goals due to improved activity tolerance, improved balance, improved postural control, ability to compensate for deficits, improved attention, improved awareness and improved coordination.  Patient to discharge at an ambulatory level supervision to modified independent.   Patient's care partner is independent to provide the necessary cognitive/supervision assistance at discharge.   Reasons goals not met: n/a - all goals met at this time.   Recommendation:  Patient will benefit from ongoing skilled PT services in home health setting to continue to advance safe functional mobility, address ongoing impairments in higher level balance, cognition, and minimize fall risk.  Equipment: No equipment provided  Reasons for discharge: treatment goals met and discharge from hospital  Patient/family agrees with progress made and goals achieved: Yes  PT Discharge Precautions/Restrictions Precautions Precautions: Fall/seizure    Cognition Overall Cognitive Status: Impaired/Different from baseline Arousal/Alertness: Awake/alert Orientation Level: Oriented X4 Selective Attention: Appears intact Divided Attention: Impaired Divided Attention Impairment: Functional complex Memory: Impaired Memory Impairment: Decreased short term memory Awareness: Appears intact Problem Solving: Impaired Problem Solving Impairment: Functional complex Safety/Judgment: Appears intact Sensation Sensation Light Touch: Appears Intact Proprioception: Appears Intact Coordination Gross Motor Movements are Fluid and Coordinated: Yes Motor  Motor Motor: Abnormal postural alignment and control  Locomotion  Gait Gait velocity: 2.73 ft/sec  Trunk/Postural Assessment  Cervical Assessment Cervical Assessment: Within Functional  Limits Thoracic Assessment Thoracic Assessment: Within Functional Limits Lumbar Assessment Lumbar Assessment: Within Functional Limits Postural Control Postural Control: Deficits on evaluation (delayed stepping reactions)  Balance Balance Balance Assessed: Yes Standardized Balance Assessment Standardized Balance Assessment: Berg Balance Test;Timed Up and Go Test Berg Balance Test Sit to Stand: Able to stand without using hands and stabilize independently Standing Unsupported: Able to stand safely 2 minutes Sitting with Back Unsupported but Feet Supported on Floor or Stool: Able to sit safely and securely 2 minutes Stand to Sit: Sits safely with minimal use of hands Transfers: Able to transfer safely, minor use of hands Standing Unsupported with Eyes Closed: Able to stand 10 seconds safely Standing Ubsupported with Feet Together: Able to place feet together independently and stand 1 minute safely From Standing, Reach Forward with Outstretched Arm: Can reach confidently >25 cm (10") From Standing Position, Pick up Object from Floor: Able to pick up shoe safely and easily From Standing Position, Turn to Look Behind Over each Shoulder: Looks behind from both sides and weight shifts well Turn 360 Degrees: Able to turn 360 degrees safely in 4 seconds or less Standing Unsupported, Alternately Place Feet on Step/Stool: Able to complete 4 steps without aid or supervision Standing Unsupported, One Foot in Front: Able to plae foot ahead of the other independently and hold 30 seconds Standing on One Leg: Able to lift leg independently and hold equal to or more than 3 seconds Total Score: 51 Timed Up and Go Test TUG: Normal TUG (average of 3 trials) Normal TUG (seconds): 13 Static Sitting Balance Static Sitting - Level of Assistance: 7: Independent Dynamic Sitting Balance Dynamic Sitting - Level of Assistance: 7: Independent Static Standing Balance Static Standing - Level of Assistance: 6:  Modified independent (Device/Increase time) Dynamic Standing Balance Dynamic Standing - Level of Assistance: 6: Modified independent (Device/Increase time) Extremity Assessment   see OT d/c summary for UE details.    RLE Assessment RLE Assessment: Within Functional Limits LLE Assessment LLE  Assessment: Within Functional Limits   See Function Navigator for Current Functional Status.  Canary Brim Ivory Broad, PT, DPT  11/07/2016, 2:21 PM

## 2016-11-06 NOTE — Progress Notes (Signed)
Physical Therapy Note  Patient Details  Name: CHATTIE GREESON MRN: [id number] Date of Birth: 14-Nov-1945 Today's Date: 11/06/2016    Pt missed 60 min of skilled PT due to medical event (question syncopal episode vs. Seizure) during OT session. Awaiting further testing and clearance for activity. Will follow up as medically able and schedule allows today. Plan was for d/c home tomorrow.   Canary Brim Ivory Broad, PT, DPT  11/06/2016, 11:16 AM

## 2016-11-06 NOTE — Progress Notes (Signed)
Occupational Therapy Note  Patient Details  Name: ALLEGRA CERNIGLIA MRN: [id number] Date of Birth: 1946/06/23  Today's Date: 11/06/2016 OT Missed Time: 81 Minutes Missed Time Reason: Patient fatigue  Pt in bed resting with IV fluids after this morning unresponsive episode. Missed 30 min    Willeen Cass [facility] 11/06/2016, 3:40 PM

## 2016-11-06 NOTE — Progress Notes (Signed)
Occupational Therapy Session Note  Patient Details  Name: Alexandria [name] MRN: [id number] Date of Birth: August 03, 1946  Today's Date: 11/06/2016 OT Individual Time: 1000-1055 OT Individual Time Calculation (min): 55 min    Short Term Goals: Week 1:  OT Short Term Goal 1 (Week 1): STGs=LTGs due to ELOS  Skilled Therapeutic Interventions/Progress Updates:   Upon entering the room, pt seated in recliner chair awaiting therapist. Pt with no c/o pain this session and agreeable to OT intervention. Pt ambulating without use of device to ADL apartment at mod I level. Pt engaged in routine meal task with overall supervision level. Pt needing verbal cues for hand hygiene and safety with contamination of meal and other food items. Pt standing for overall task for 40 minutes and then appeared to have medical event of syncope vs. seizure activity. Staff lowered pt to floor for safety and RN notified, vitals taken, and PA arrived. Pt symptomatic during this session and when attempting to sit up having another epidsode. Pt able to perform floor transfer into chair with therapist with close supervision. OT propelled pt via wheelchair to room and pt transferred back to bed for further testing. Call bell and all needed items within reach.  Therapy Documentation Precautions:  Precautions Precautions: Fall Restrictions Weight Bearing Restrictions: No General: General PT Missed Treatment Reason: MD hold (Comment) Vital Signs: Therapy Vitals Temp: 97.7 F (36.5 C) Pulse Rate: (!) 57 BP: 109/66 Patient Position (if appropriate): Lying Oxygen Therapy SpO2: 94 % O2 Device: Not Delivered Pain:   ADL: ADL ADL Comments: Please see functional navigator for ADL status Exercises:   Other Treatments:    See Function Navigator for Current Functional Status.   Therapy/Group: Individual Therapy  Gypsy Decant 11/06/2016, 12:28 PM

## 2016-11-07 ENCOUNTER — Inpatient Hospital Stay (HOSPITAL_COMMUNITY): Payer: Medicare Other

## 2016-11-07 ENCOUNTER — Other Ambulatory Visit: Payer: Self-pay

## 2016-11-07 ENCOUNTER — Inpatient Hospital Stay (HOSPITAL_COMMUNITY): Payer: Medicare Other | Admitting: Occupational Therapy

## 2016-11-07 DIAGNOSIS — R55 Syncope and collapse: Secondary | ICD-10-CM

## 2016-11-07 LAB — BASIC METABOLIC PANEL
ANION GAP: 11 (ref 5–15)
BUN: 17 mg/dL (ref 6–20)
CHLORIDE: 101 mmol/L (ref 101–111)
CO2: 24 mmol/L (ref 22–32)
Calcium: 8.4 mg/dL — ABNORMAL LOW (ref 8.9–10.3)
Creatinine, Ser: 0.79 mg/dL (ref 0.44–1.00)
Glucose, Bld: 121 mg/dL — ABNORMAL HIGH (ref 65–99)
POTASSIUM: 3.6 mmol/L (ref 3.5–5.1)
SODIUM: 136 mmol/L (ref 135–145)

## 2016-11-07 LAB — CBC
HEMATOCRIT: 35.1 % — AB (ref 36.0–46.0)
HEMOGLOBIN: 11.5 g/dL — AB (ref 12.0–15.0)
MCH: 29 pg (ref 26.0–34.0)
MCHC: 32.8 g/dL (ref 30.0–36.0)
MCV: 88.6 fL (ref 78.0–100.0)
PLATELETS: 127 10*3/uL — AB (ref 150–400)
RBC: 3.96 MIL/uL (ref 3.87–5.11)
RDW: 13.1 % (ref 11.5–15.5)
WBC: 7.3 10*3/uL (ref 4.0–10.5)

## 2016-11-07 LAB — HEPATITIS B SURFACE ANTIGEN: HEP B S AG: NEGATIVE

## 2016-11-07 LAB — GLUCOSE, CAPILLARY
GLUCOSE-CAPILLARY: 127 mg/dL — AB (ref 65–99)
GLUCOSE-CAPILLARY: 84 mg/dL (ref 65–99)

## 2016-11-07 LAB — HEPATITIS C ANTIBODY (REFLEX): HCV Ab: <0.1 s/co ratio (ref 0.0–0.9)

## 2016-11-07 LAB — TROPONIN I
TROPONIN I: 0.03 ng/mL — AB (ref <0.03)
Troponin I: 0.03 ng/mL (ref <0.03)

## 2016-11-07 LAB — HCV COMMENT:

## 2016-11-07 MED ORDER — HYDROCODONE-ACETAMINOPHEN 5-325 MG PO TABS: 0.5000 | ORAL_TABLET | Freq: Two times a day (BID) | ORAL | 0 refills | Status: DC | PRN

## 2016-11-07 MED ORDER — DEXAMETHASONE 2 MG PO TABS: ORAL_TABLET | ORAL | 0 refills | Status: DC

## 2016-11-07 MED ORDER — HEPARIN SOD (PORK) LOCK FLUSH 100 UNIT/ML IV SOLN
250.0000 [IU] | INTRAVENOUS | Status: AC | PRN
  Administered 2016-11-07: 250 [IU]

## 2016-11-07 MED ORDER — METOPROLOL SUCCINATE ER 25 MG PO TB24: 25.0000 mg | ORAL_TABLET | Freq: Every evening | ORAL | Status: DC

## 2016-11-07 MED ORDER — LEVETIRACETAM 500 MG PO TABS: 500.0000 mg | ORAL_TABLET | Freq: Two times a day (BID) | ORAL | 1 refills | Status: DC

## 2016-11-07 MED ORDER — METRONIDAZOLE 500 MG PO TABS: 500.0000 mg | ORAL_TABLET | Freq: Four times a day (QID) | ORAL | 1 refills | Status: DC

## 2016-11-07 MED ORDER — BENAZEPRIL HCL 20 MG PO TABS: 20.0000 mg | ORAL_TABLET | Freq: Every day | ORAL | 1 refills | Status: DC

## 2016-11-07 MED ORDER — METOPROLOL SUCCINATE ER 25 MG PO TB24: 25.0000 mg | ORAL_TABLET | Freq: Every evening | ORAL | 1 refills | Status: DC

## 2016-11-07 NOTE — Discharge Instructions (Signed)
Inpatient Rehab Discharge Instructions  Alexandria [name] [place] Discharge date and time: 11/07/16   Activities/Precautions/ Functional Status: Activity: no lifting, driving, or strenuous exercise till cleared by MD Diet: regular diet--monitor sweets/starch intake for the next 2 weeks.  Wound Care: Wash with soap and water. Keep wound clean and dry  Functional status:  ___ No restrictions     ___ Walk up steps independently _X__ 24/7 supervision/assistance   ___ Walk up steps with assistance ___ Intermittent supervision/assistance  ___ Bathe/dress independently ___ Walk with walker     ___ Bathe/dress with assistance ___ Walk Independently    _X__ Shower independently _X__ Walk with supervision    ___ Shower with assistance _X__ No alcohol     ___ Return to work/school ________    COMMUNITY REFERRALS UPON DISCHARGE:   Home Health:   PT     OT     ST    RN     Agency:  [facility] Phone:  [phone] Medical Equipment/Items Ordered:  IV Antibiotics  Agency/Supplier:  [facility]          Phone:  [phone]  Special Instructions: 1. Continue intravenous Rocephin 2 g every 12 hours as well as Flagyl 500 mg by mouth every 6  hours until 12/08/2016.  2. No alcohol, aspirin or aspirin containing products.   3. Appointment with Dr. Megan Salon May  1st at 10:45 am.    My questions have been answered and I understand these instructions. I will adhere to these goals and the provided educational 1. materials after my discharge from the hospital.  Patient/Caregiver Signature _______________________________ Date __________  Clinician Signature _______________________________________ Date __________  Please bring this form and your medication list with you to all your follow-up doctor's appointments.

## 2016-11-07 NOTE — Progress Notes (Addendum)
Physical Therapy Session Note  Patient Details  Name: Alexandria [name] MRN: [id number] Date of Birth: 1946-03-20  Today's Date: 11/07/2016 PT Individual Time: 1000-1105 PT Individual Time Calculation (min): 65 min   Short Term Goals: Week 1:  PT Short Term Goal 1 (Week 1): =LTG due to estimated LOS  Skilled Therapeutic Interventions/Progress Updates:    Session focused on grad day activities with focus on functional gait without AD, higher level balance and dual task cognition activities (extra time and pt takes pauses while performing these dual task activities for processing), simulated car transfer, gait over uneven surfaces to simulate outdoor and community mobility, stair negotiation for community mobility and functional strengthening/endurance, floor transfer and reviewed fall recovery (as well as what to do in case of emergency or syncopal episode), and administered standardized balance assessments (Berg, TUG, Gait velocity and Modified Stroop Test) which are listed below with results. Pt completed basic mobility at overall modified independent level without AD. Pt with one episode of LOB during gait but able to self correct though did require use of 1 UE support on doorframe. Daughter present at end of session and reviewed education on what to do in case of fall or emergency.   Modified Stroop Test 4/6 Unsupported Sitting:  Word: 7 sec Color:9 sec Standing:  Word: 7 sec Color: 9 sec Walking:  Word: 7 sec Color: 16 sec Pt demonstrates decreased time for word naming and naming color compared to initial administration of test (3/31) except during walking (12 sec on 3/31 vs 16 seconds today) demonstrating improvement.    Therapy Documentation Precautions:  Precautions Precautions: Fall Restrictions Weight Bearing Restrictions: No   Pain:  Denies pain   Locomotion : Gait Gait velocity: 2.73 ft/sec  Trunk/Postural Assessment : Cervical Assessment Cervical Assessment:  Within Functional Limits Thoracic Assessment Thoracic Assessment: Within Functional Limits Lumbar Assessment Lumbar Assessment: Within Functional Limits Postural Control Postural Control: Deficits on evaluation (delayed stepping reactions)  Balance: Balance Balance Assessed: Yes Standardized Balance Assessment Standardized Balance Assessment: Berg Balance Test;Timed Up and Go Test Berg Balance Test Sit to Stand: Able to stand without using hands and stabilize independently Standing Unsupported: Able to stand safely 2 minutes Sitting with Back Unsupported but Feet Supported on Floor or Stool: Able to sit safely and securely 2 minutes Stand to Sit: Sits safely with minimal use of hands Transfers: Able to transfer safely, minor use of hands Standing Unsupported with Eyes Closed: Able to stand 10 seconds safely Standing Ubsupported with Feet Together: Able to place feet together independently and stand 1 minute safely From Standing, Reach Forward with Outstretched Arm: Can reach confidently >25 cm (10") From Standing Position, Pick up Object from Floor: Able to pick up shoe safely and easily From Standing Position, Turn to Look Behind Over each Shoulder: Looks behind from both sides and weight shifts well Turn 360 Degrees: Able to turn 360 degrees safely in 4 seconds or less Standing Unsupported, Alternately Place Feet on Step/Stool: Able to complete 4 steps without aid or supervision Standing Unsupported, One Foot in Front: Able to plae foot ahead of the other independently and hold 30 seconds Standing on One Leg: Able to lift leg independently and hold equal to or more than 3 seconds Total Score: 51 Timed Up and Go Test TUG: Normal TUG (average of 3 trials) Normal TUG (seconds): 13 Static Sitting Balance Static Sitting - Level of Assistance: 7: Independent Dynamic Sitting Balance Dynamic Sitting - Level of Assistance: 7: Independent Static Standing Balance  Static Standing - Level of  Assistance: 6: Modified independent (Device/Increase time) Dynamic Standing Balance Dynamic Standing - Level of Assistance: 6: Modified independent (Device/Increase time)   See Function Navigator for Current Functional Status.   Therapy/Group: Individual Therapy  Canary Brim Ivory Broad, PT, DPT  11/07/2016, 11:11 AM

## 2016-11-07 NOTE — Discharge Summary (Signed)
Occupational Therapy Discharge Summary  Patient Details  Name: UMEKA WRENCH MRN: [id number] Date of Birth: 1946/04/01  Today's Date: 11/07/2016 OT Individual Time: 3382-5053 OT Individual Time Calculation (min): 60 min   Patient has met 14 of 14 long term goals due to improved activity tolerance, improved balance, improved attention and improved awareness.  Patient to discharge at overall Modified Independent level.  Patient's family will provide the necessary cognitive assistance at discharge.    All goals met.   Recommendation:  No OT followup recommended   Equipment: None   Reasons for discharge: treatment goals met  Patient/family agrees with progress made and goals achieved: Yes   Skilled Therapeutic Intervention:  Pt was sitting in recliner at time of arrival, reporting that she felt well and slept well. Checked in with PA and RN regarding therapy restrictions due to syncopal episode yesterday, and they reported to proceed with tx per pts tolerance. Orthostatics and BP monitored throughout session, please see flowsheet for details. She completed ADLs at shower level with Mod I, able to step over simulated threshold in walk in shower to emulate home environment. No c/o dizziness or visual "aura" that she described as preceding her syconpal event yesterday. Afterwards, she began cleaning her room. She was able to stoop to floor to gather items from bags and pick up laundry with Mod I. Discussed DME needs. She already has built in shower seat and toilet that is at an accessible height. Also discussed having grab bars professionally installed in shower at home. At end of session pt was left in recliner with all needs and L UE elevated.    OT Discharge Precautions/Restrictions  Precautions Precautions: Fall Restrictions Weight Bearing Restrictions: No  Pain Pain Assessment Pain Assessment: 0-10 (Requested prior to D/C as RXs not avail until late evening.Marland Kitchen) Pain Score: 6   Pain Type: Surgical pain Pain Location: Head Pain Orientation: Left Pain Descriptors / Indicators: Aching Pain Onset: Gradual Patients Stated Pain Goal: 1 Pain Intervention(s): Medication (See eMAR) Multiple Pain Sites: No ADL ADL ADL Comments: Please see functional navigator for ADL status Vision/Perception  Vision- History Baseline Vision/History: No visual deficits Patient Visual Report: No change from baseline  Cognition Overall Cognitive Status: Impaired/Different from baseline Arousal/Alertness: Awake/alert Orientation Level: Oriented X4 Selective Attention: Appears intact Divided Attention: Impaired Memory: Impaired Awareness: Appears intact Problem Solving: Appears intact Safety/Judgment: Appears intact Sensation Sensation Light Touch: Appears Intact Stereognosis: Not tested Hot/Cold: Not tested Proprioception: Appears Intact Coordination Gross Motor Movements are Fluid and Coordinated: Yes Fine Motor Movements are Fluid and Coordinated: Yes Motor  Motor Motor: Abnormal postural alignment and control Mobility  Transfers Transfers: Sit to Stand;Stand to Sit Sit to Stand: 7: Independent Stand to Sit: 7: Independent  Trunk/Postural Assessment  Cervical Assessment Cervical Assessment: Within Functional Limits Thoracic Assessment Thoracic Assessment: Within Functional Limits Lumbar Assessment Lumbar Assessment: Within Functional Limits Postural Control Postural Control: Within Functional Limits  Balance Balance Balance Assessed: Yes Dynamic Sitting Balance Dynamic Sitting - Balance Support: Feet supported;No upper extremity supported Dynamic Sitting - Level of Assistance: 7: Independent Dynamic Sitting Balance - Compensations: during LB dressing at EOB Dynamic Standing Balance Dynamic Standing - Balance Support: No upper extremity supported Dynamic Standing - Level of Assistance: 6: Modified independent (Device/Increase time) Dynamic Standing -  Balance Activities: Lateral lean/weight shifting;Reaching for weighted objects;Forward lean/weight shifting Dynamic Standing - Comments: when showering and completing grooming tasks at sink Extremity/Trunk Assessment RUE Assessment RUE Assessment: Within Functional Limits LUE Assessment LUE Assessment: Exceptions  to [facility] (Not tested due to DVT)   See Function Navigator for Current Functional Status.  Ziggy Reveles A Ferrell [employer] 11/07/2016, 4:02 PM

## 2016-11-07 NOTE — Progress Notes (Signed)
[facility] PHYSICAL MEDICINE & REHABILITATION     PROGRESS NOTE    Subjective/Complaints: Had a syncopal episode yesterday when up with therapy only lasting a few seconds. No issues afterward once she had a chance to lay down. Feels fine this morning and is up with OT in room bathing/dressing without any issues  ROS: pt denies nausea, vomiting, diarrhea, cough, shortness of breath or chest pain   Objective: Vital Signs: Blood pressure 127/75, pulse 62, temperature 98 F (36.7 C), temperature source Oral, resp. rate 18, height 5\' 2"  (1.575 m), weight 77.3 kg (170 lb 6.7 oz), SpO2 94 %. No results found.  Recent Labs  11/06/16 0458 11/07/16 0400  WBC 7.7 7.3  HGB 12.5 11.5*  HCT 36.9 35.1*  PLT 144* 127*    Recent Labs  11/06/16 0458 11/07/16 0400  NA 134* 136  K 3.0* 3.6  CL 98* 101  GLUCOSE 141* 121*  BUN 20 17  CREATININE 0.75 0.79  CALCIUM 8.6* 8.4*   CBG (last 3)   Recent Labs  11/06/16 1618 11/06/16 2111 11/07/16 0639  GLUCAP 284* 154* 127*    Wt Readings from Last 3 Encounters:  11/05/16 77.3 kg (170 lb 6.7 oz)  10/23/16 74.9 kg (165 lb 2 oz)    Physical Exam:  HENT:  Craniotomy site clean and dry. Sutures out Eyes: EOMI  Neck: Normal range of motion. Neck supple. No thyromegaly present.  Cardiovascular: RR HR in 60's   Respiratory: CTA B GI: Soft. Bowel sounds are normal. She exhibits no distension.  Neurological: She is alert.   Follows simple commands. Functional communication. Speech clear. Language   Fluent. Standing without loss of balance, good coordination. Motor strength is 5/5 bilateral deltoid, bicep, tricep grip. 4+/5 Bilateral hip flexor, knee extensor, ankle dorsiflexor. normal insight and awareness.  Psych: pleasant and appropriate Ext: LUE with decreased swelling and ecchymoses   Assessment/Plan: 1. 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 Physiatrist is providing close team supervision and 24 hour management of active medical problems listed below. Physiatrist and rehab team continue to assess barriers to discharge/monitor patient progress toward functional and medical goals.  Function:  Bathing Bathing position   Position: Shower  Bathing parts Body parts bathed by patient: Right arm, Left arm, Chest, Abdomen, Front perineal area, Buttocks, Right upper leg, Left upper leg, Right lower leg, Left lower leg, Back    Bathing assist Assist Level: More than reasonable time      Upper Body Dressing/Undressing Upper body dressing   What is the patient wearing?: Pull over shirt/dress, Bra Bra - Perfomed by patient: Thread/unthread right bra strap, Thread/unthread left bra strap, Hook/unhook bra (pull down sports bra)   Pull over shirt/dress - Perfomed by patient: Thread/unthread right sleeve, Thread/unthread left sleeve, Put head through opening, Pull shirt over trunk   Button up shirt - Perfomed by patient: Thread/unthread right sleeve, Thread/unthread left sleeve, Pull shirt around back, Button/unbutton shirt      Upper body assist Assist Level: More than reasonable time      Lower Body Dressing/Undressing Lower body dressing   What is the patient wearing?: Underwear, Pants, Socks, Shoes Underwear - Performed by patient: Thread/unthread right underwear leg, Thread/unthread left underwear leg, Pull underwear up/down   Pants- Performed by patient: Thread/unthread right pants leg, Thread/unthread left pants leg, Pull pants up/down, Fasten/unfasten pants  Socks - Performed by patient: Don/doff right sock, Don/doff left sock   Shoes - Performed by patient: Don/doff right shoe, Don/doff left shoe, Fasten right, Fasten left Shoes - Performed by helper: Don/doff left shoe          Lower body assist Assist for lower body dressing: More than reasonable time       Toileting Toileting Toileting activity did not occur: No continent bowel/bladder event Toileting steps completed by patient: Adjust clothing prior to toileting, Performs perineal hygiene, Adjust clothing after toileting   Toileting Assistive Devices: Grab bar or rail  Toileting assist Assist level: Supervision or verbal cues   Transfers Chair/bed transfer   Chair/bed transfer method: Ambulatory Chair/bed transfer assist level: Supervision or verbal cues Chair/bed transfer assistive device: Armrests     Locomotion Ambulation     Max distance: 100' Assist level: No help, No cues, assistive device, takes more than a reasonable amount of time   Wheelchair          Cognition Comprehension Comprehension assist level: Understands basic 90% of the time/cues < 10% of the time  Expression Expression assist level: Expresses basic 90% of the time/requires cueing < 10% of the time.  Social Interaction Social Interaction assist level: Interacts appropriately 75 - 89% of the time - Needs redirection for appropriate language or to initiate interaction.  Problem Solving Problem solving assist level: Solves basic 75 - 89% of the time/requires cueing 10 - 24% of the time  Memory Memory assist level: Recognizes or recalls 75 - 89% of the time/requires cueing 10 - 24% of the time   Medical Problem List and Plan: 1.  Decreased functional mobility with cognitive deficits secondary to Left frontal lobe brain abscess  -CIR PT, OT SLP  -dc today  -Patient to see MD in the office for transitional care encounter in 1-2 weeks.   -would like bmet/cbc on Monday 2.  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 Pain Management: Hydrocodone as needed 4. Mood: Xanax 0.125-0.25 mg 2 at bedtime as needed 5. Neuropsych: This patient is capable of making decisions on her own  behalf. 6. Skin/Wound Care: Routine skin checks,   -removed staples  y 7. Fluids/Electrolytes/Nutrition: encourage po intake  -hypokalemic--supplemented and improved today upon review of labs  -mild hyponatremia--with improvement today  8. ID/Streptococcus intermedius. Intravenous ceftriaxone and oral Flagyl 6 weeks ending 12/08/2016 and follow-up per infectious disease   -wbc's 7.3 today  -keep an eye on platelets, 127k today---recheck on Monday  -Dr. Tommy Medal concerned by atypical infiltrate on path (?lymphoproliferative vs neoplastic process)   -f/u MRI in a month 9. Seizure prophylaxis. Keppra 500 mg twice a day no seizure activity 10. Hyperglycemia secondary to Decadron. Monitor with decrease in steroids--sugars are normal  -dc cbg's 11. Hypertension/CV  -given syncopal episode yesterday meds have been adjusted  -toprol-XL decreased to 50mg ---decrease further to 25mg  at discharge   -likely doesn't require doses she was on before  -can continue lotensin at 20mg   -hctz stopped  -troponin 0.03 with EKG only showing bradycardia--has no CV history   -suspect elevated troponin (at borderline) related to left subclavian DVT  -potassium supplemented  -encouraging fluids  -push patient today from an activity standpoint. If no problems she can go home 12.. Constipation. Laxative assistance    LOS (Days) 8 A FACE TO FACE EVALUATION WAS PERFORMED  Meredith Staggers, MD 11/07/2016 9:18 AM

## 2016-11-07 NOTE — Progress Notes (Signed)
Occupational Therapy Session Note  Patient Details  Name: Alexandria [name] MRN: [id number] Date of Birth: 11/14/1945  Today's Date: 11/07/2016 OT Individual Time: 1132-1200 OT Individual Time Calculation (min): 28 min    Short Term Goals: Week 1:  OT Short Term Goal 1 (Week 1): STGs=LTGs due to ELOS  Skilled Therapeutic Interventions/Progress Updates:    Pt seen seated in recliner upon arrival with daughter present. Pt requests to use bathroom with increased time to void bowel and wash hands. Pt ambulates throughout session to/from all therapeutic destinations with MOD I. Pt sits at table pt learns rules of table top game, follows appropriately rules, and keeps score with simple addition with MIN VC for rule following. Exited session with pt seated in recliner with all needs met and daughter present.   Therapy Documentation Precautions:  Precautions Precautions: Fall Restrictions Weight Bearing Restrictions: No General:   Vital Signs:   Pain:   ADL: ADL ADL Comments: Please see functional navigator for ADL status Exercises:   Other Treatments:    See Function Navigator for Current Functional Status.   Therapy/Group: Individual Therapy  Tonny Branch 11/07/2016, 12:14 PM

## 2016-11-07 NOTE — Progress Notes (Signed)
Pt discharged to home accompanied by daughter. No further episodes of syncope. Pt denies any dizziness, lightheadedness; VSS. Pt and daughter verbalize understanding of D/C instructions given by Algis Liming, PAC. IV education done by Jeannene Patella, RN, home health (see Note).  Pt requested prn for headache prior to D/C. Ed. Provided.

## 2016-11-07 NOTE — Progress Notes (Signed)
[facility] and Pharmacy teams are prepared for DC home today with admission visit tonight between 5-6 PM for PM Rocephin dose. Teaching regarding IV ABX administration was provided with daughter Okey Dupre prior to [facility] admission. Daughter did very well and extremely engaged and supportive of care needs for her mother.  [facility] will continue teaching IV ABX and other HH needs in the home with the pt and daughter.  Patient's sister , Lorrin Goodell, also taught IV ABX administration and will continue with teaching at home to serve as a back up.  If patient discharges after hours, please call [phone].   Larry Sierras 11/07/2016, 9:39 AM

## 2016-11-07 NOTE — Progress Notes (Signed)
Physical Therapy Session Note  Patient Details  Name: Alexandria [name] MRN: [id number] Date of Birth: Dec 12, 1945  Today's Date: 11/07/2016 PT Individual Time: 1340-1405 (25 min)  Short Term Goals: Week 1:  PT Short Term Goal 1 (Week 1): =LTG due to estimated LOS  Skilled Therapeutic Interventions/Progress Updates:    Session focused on addressing functional endurance, dynamic standing balance, and functional mobility. Pt participated in [place] activity modified independent level for balance including managing cord to plug/unplug television. Pt able to maintain standing position throughout the entire session without rest break needed and denies any symptoms of lightheadness. Denies any questions and PA entered room at end of session for discharge instructions.   Therapy Documentation Precautions:  Precautions Precautions: Fall Restrictions Weight Bearing Restrictions: No  Pain:  Denies pain.   See Function Navigator for Current Functional Status.   Therapy/Group: Individual Therapy  Canary Brim Ivory Broad, PT, DPT  11/07/2016, 2:17 PM

## 2016-11-07 NOTE — Significant Event (Signed)
CRITICAL VALUE ALERT  Critical value received:  Troponin I: 0.03  Date of notification:  11/07/2016  Time of notification:  0107  Critical value read back:Yes.    Nurse who received alert:  Renato Gails, RN  MD notified (1st page):  Reesa Chew, [place]  Time of first page:  [phone]  MD notified (2nd page):  Time of second page:  Responding MD:  Reesa Chew, PA  Time MD responded:  [phone]

## 2016-11-07 NOTE — Progress Notes (Signed)
Patient given Rx for Vicodin 5/325 mg # 10 pills --1/2 to one bid prn severe pain.

## 2016-11-09 NOTE — Discharge Summary (Signed)
Discharge summary job # [id number]

## 2016-11-10 ENCOUNTER — Telehealth: Payer: Self-pay | Admitting: *Deleted

## 2016-11-10 NOTE — Telephone Encounter (Signed)
Transitional Care call-who you talked with    1. Are you/is patient experiencing any problems since coming home? Are there any questions regarding any aspect of care? No 2. Are there any questions regarding medications administration/dosing? Are meds being taken as prescribed? Patient should review meds with caller to confirm [facility] been out and reviewed all meds and they are being taken. 3. Have there been any falls? No 4. Has Home Health been to the house and/or have they contacted you? If not, have you tried to contact them? Can we help you contact them? Yes all services have made contact 5. Are bowels and bladder emptying properly? Are there any unexpected incontinence issues? If applicable, is patient following bowel/bladder programs? On a lot of antibiotics and some stomach issues but being managed 6. Any fevers, problems with breathing, unexpected pain? No 7. Are there any skin problems or new areas of breakdown? No 8. Has the patient/family member arranged specialty MD follow up (ie cardiology/neurology/renal/surgical/etc)?  Can we help arrange? Appt given for Dr Naaman Plummer follow up 9. Does the patient need any other services or support that we can help arrange? No 10. Are caregivers following through as expected in assisting the patient? Yes 11. Has the patient quit smoking, drinking alcohol, or using drugs as recommended? N/A  Appointment Monday 11/17/16 @2 :20  Arrive by 2:00 for appt with Dr Naaman Plummer  Address reviewed [address]

## 2016-11-10 NOTE — Progress Notes (Signed)
Social Work  Discharge Note  The overall goal for the admission was met for:   Discharge location: Yes - home with family providing all needed assist  Length of Stay: Yes - 8 days  Discharge activity level: Yes - supervision to mod ind  Home/community participation: Yes  Services provided included: MD, RD, PT, OT, SLP, RN, TR, Pharmacy, [place]: [facility]  Follow-up services arranged: Home Health: RN, PT, OT, ST via [place] and Patient/Family has no preference for HH/DME agencies   Comments (or additional information):  Patient/Family verbalized understanding of follow-up arrangements: Yes  Individual responsible for coordination of the follow-up plan: pt  Confirmed correct DME delivered: NA    Azir Muzyka

## 2016-11-10 NOTE — Telephone Encounter (Signed)
RN from [facility] left a message asking for verbal orders to begin home skilled nursing visits to address medication, IV medication, and general teachings. Verbal orders given per office protocol

## 2016-11-10 NOTE — Discharge Summary (Signed)
NAME:  Alexandria [name], PORCO NO.:  MEDICAL RECORD NO.:  [id number]  LOCATION:                                 FACILITY:  PHYSICIAN:  Meredith Staggers, M.D.DATE OF BIRTH:  1946/06/24  DATE OF ADMISSION:  10/31/2016 DATE OF DISCHARGE:  11/07/2016                              DISCHARGE SUMMARY   DISCHARGE DIAGNOSES: 1. Left frontal lobe brain abscess. 2. SCDs for deep venous thrombosis prophylaxis. 3. Pain management. 4. Anxiety. 5. Streptococcus intermedius. 6. Seizure prophylaxis. 7. Hyperglycemia secondary to Decadron. 8. Hypertension. 9. Constipation.  HISTORY OF PRESENT ILLNESS:  This is a 71 year old right-handed female, history of hypertension, remote history of breast cancer, Lives alone, independent prior to admission.  Presented on October 22, 2016, with altered mental status that progressed.  CT imaging revealed a mass-like lesion, left frontal lobe with surrounding vasogenic edema, localized mass effect, question malignancy versus abscess.  CT of abdomen and pelvis as well as CT of the chest for acute findings were negative. Neurosurgery consulted, underwent left craniotomy, tumor excision, October 27, 2016, per Dr. Vertell Limber.  Infectious Disease consulted.  Blood cultures showing Streptococcus intermedius, maintained on intravenous ceftriaxone and oral Flagyl anticipation of 6 weeks.  Decadron protocol as directed. Keppra added for seizure prophylaxis.  Tolerating a regular diet.  The patient was admitted for a comprehensive rehab program.  PAST MEDICAL HISTORY:  See discharge diagnoses.  SOCIAL HISTORY:  Lives alone, independent prior to admission.  Good support of local family.  FUNCTIONAL STATUS:  Upon admission to [place] was minimal assist, sit to stand; min-to-mod assist, activities of daily living.  PHYSICAL EXAMINATION:  VITAL SIGNS:  Blood pressure 168/64, pulse 78, temperature 98, respirations 20. GENERAL:  This was an alert  female, made good eye contact with examiner, followed simple commands, provides her name and age, exhibit some decrease in recall of recent events. HEENT:  EOMs intact.  Pupils are round and reactive to light. Craniotomy site clean and dry. CARDIAC:  Regular rate and rhythm without murmur. ABDOMEN:  Soft, nontender, good bowel sounds. LUNGS:  Clear to auscultation without wheeze.  REHABILITATION HOSPITAL COURSE:  The patient was admitted to Inpatient Rehab Services with therapies initiated on a 3-hour daily basis, consisting of physical therapy, occupational therapy and rehabilitation nursing.  The following issues were addressed during the patient's rehabilitation stay.  Pertaining to Ms. Bevel, left frontal lobe brain abscess, she had underwent craniotomy, surgical site healing nicely.  She would follow up with Neurology Services as well as Neurosurgery.  Blood culture, Streptococcus intermedius followed by Infectious Disease.  She would continue on intravenous ceftriaxone as well as Flagyl x6 weeks ending Dec 08, 2016.  There were findings of a small left subclavian DVT.  Her PICC line had been removed, received a tunneled right CVC, placed by Interventional Radiology.  Pain management with use of hydrocodone.  Blood pressures remain monitored.  Her Toprol was adjusted.  She continued on low-dose Lotensin, HCTZ had been discontinued.  Keppra for seizure prophylaxis.  The patient received weekly collaborative interdisciplinary team conferences to discuss estimated length of stay, family teaching, any barriers to discharge. Sessions focused on activity tolerance, improved balance,  improved postural control.  Ability to compensate for deficits as well as improved attention.  She participated fully with her sessions.  She was able to maintain standing positions throughout entire sessions.  No chest pain or shortness of breath.  Gather belongings for activities of daily living and  homemaking.  Again safety for any decrease in awareness, modified independence for her ADLs.  Full family teaching was completed and plan discharge to home.  DISCHARGE MEDICATIONS:  At time of dictation included: 1. Xanax 0.25 mg at bedtime as needed. 2. Lotensin 20 mg p.o. daily. 3. Continue intravenous Rocephin 2 g every 12 hours through Dec 08, 2016 with followup CBC and chemistries. 4. Decadron taper 2 mg taper as directed. 5. Hydrocodone 5/325 half to one tablet every 12 hours as needed     severe pain. 6. Keppra 500 mg p.o. b.i.d. 7. Toprol-XL 25 mg p.o. daily. 8. Flagyl 500 mg 4 times daily through Dec 08, 2016. 9. Protonix 40 mg p.o. daily. 10.Florastor 250 mg p.o. b.i.d.  FOLLOWUP:  The patient would follow up with Dr. Alger Simons at the Outpatient Rehab Service office as advised; Dr. Erline Levine, Neurosurgery, call for appointment, Dr. Kelton Pillar, Medical Management; Dr. Michel Bickers, Infectious Disease.     Lauraine Rinne, P.A.   ______________________________ Meredith Staggers, M.D.    DA/MEDQ  D:  11/09/2016  T:  11/09/2016  Job:  [id number]  cc:   Michel Bickers, M.D. Marchia Meiers. Vertell Limber, M.D. [place]ford Cage Laurann Montana, M.D.

## 2016-11-11 NOTE — Progress Notes (Signed)
Recreational Therapy Discharge Summary Patient Details  Name: SHAJUAN MUSSO MRN: [id number] Date of Birth: 28-Mar-1946 Today's Date: 11/11/2016 Comments on progress toward goals: Pt participated in community outing at supervision ambulatory level with verbal cues for cognition -see PN in chart for full details.  Pt is discharging home with family at supervision ambulatory level.   Patient/family agrees with progress made and goals achieved: Yes  Kodee Drury 11/11/2016, 11:30 AM

## 2016-11-12 ENCOUNTER — Telehealth: Payer: Self-pay

## 2016-11-12 NOTE — Telephone Encounter (Signed)
[place] [facility] [phone]) called along with Cletis [place] OT [facility] [phone]) as well called both requesting verbal orders for 1xwk for 2wks for higher cog development along with safety skills and ibls training, called both back and verified verbal orders

## 2016-11-17 ENCOUNTER — Encounter: Payer: Medicare Other | Attending: Physical Medicine & Rehabilitation | Admitting: Physical Medicine & Rehabilitation

## 2016-11-17 ENCOUNTER — Encounter: Payer: Self-pay | Admitting: Physical Medicine & Rehabilitation

## 2016-11-17 VITALS — BP 151/95 | HR 108

## 2016-11-17 DIAGNOSIS — R419 Unspecified symptoms and signs involving cognitive functions and awareness: Secondary | ICD-10-CM | POA: Diagnosis not present

## 2016-11-17 DIAGNOSIS — G9340 Encephalopathy, unspecified: Secondary | ICD-10-CM | POA: Diagnosis not present

## 2016-11-17 DIAGNOSIS — I10 Essential (primary) hypertension: Secondary | ICD-10-CM

## 2016-11-17 DIAGNOSIS — Z803 Family history of malignant neoplasm of breast: Secondary | ICD-10-CM | POA: Diagnosis not present

## 2016-11-17 DIAGNOSIS — G060 Intracranial abscess and granuloma: Secondary | ICD-10-CM

## 2016-11-17 DIAGNOSIS — Z853 Personal history of malignant neoplasm of breast: Secondary | ICD-10-CM | POA: Insufficient documentation

## 2016-11-17 DIAGNOSIS — Z800 Family history of malignant neoplasm of digestive organs: Secondary | ICD-10-CM | POA: Diagnosis not present

## 2016-11-17 DIAGNOSIS — Z79899 Other long term (current) drug therapy: Secondary | ICD-10-CM | POA: Insufficient documentation

## 2016-11-17 MED ORDER — METOPROLOL SUCCINATE ER 50 MG PO TB24: 50.0000 mg | ORAL_TABLET | Freq: Every day | ORAL | 4 refills | Status: AC

## 2016-11-17 NOTE — Progress Notes (Signed)
Subjective:    Patient ID: Alexandria [name], female    DOB: Apr 04, 1946, 71 y.o.   MRN: [id number]  HPI   This is a transitional care visit for Alexandria [name] after her rehab stay for her brain abscess. She has been home with home therapies. She still is feeling a little tired but she has continued to work on her gait and exercise. She hasn't had any falls. She noticed once the other day when she got on the floor she struggled to get up.   She is using tylenol for pain. She is sleeping well. Bowel and bladder function are normal  HH has drawn labs but did not send to me. States that she was told that her platelets were still low. She denies fever, problems swallowing. Mood is good. Family is very supportive.     Pain Inventory Average Pain 2 Pain Right Now 0 My pain is .  In the last 24 hours, has pain interfered with the following? General activity 0 Relation with others 0 Enjoyment of life 0 What TIME of day is your pain at its worst? night Sleep (in general) Good  Pain is worse with: . Pain improves with: medication Relief from Meds: 10  Mobility walk without assistance ability to climb steps?  yes do you drive?  yes  Function not employed: date last employed 2017  Neuro/Psych No problems in this area  Prior Studies Any changes since last visit?  no  Physicians involved in your care Any changes since last visit?  no   Family History  Problem Relation Age of Onset  . Breast cancer Mother   . Colon cancer Father    Social History   Social History  . Marital status: Single    Spouse name: N/A  . Number of children: N/A  . Years of education: N/A   Occupational History  . retired Freight forwarder    Social History Main Topics  . Smoking status: Never Smoker  . Smokeless tobacco: Never Used  . Alcohol use Yes     Comment: occasional  . Drug use: No  . Sexual activity: Not Currently   Other Topics Concern  . Not on file   Social History  Narrative   1 lifetime sexual partner, last intercourse about 7 years ago?  No drugs including illicits.   Past Surgical History:  Procedure Laterality Date  . ABDOMINAL HYSTERECTOMY    . APPLICATION OF CRANIAL NAVIGATION N/A 10/27/2016   Procedure: APPLICATION OF CRANIAL NAVIGATION;  Surgeon: Erline Levine, MD;  Location: [place];  Service: Neurosurgery;  Laterality: N/A;  . CRANIOTOMY Left 10/27/2016   Procedure: CRANIOTOMY TUMOR EXCISION;  Surgeon: Erline Levine, MD;  Location: [place];  Service: Neurosurgery;  Laterality: Left;  . IR GENERIC HISTORICAL  11/02/2016   IR US GUIDE VASC ACCESS RIGHT 11/02/2016 Arne Cleveland, MD [facility]  . IR GENERIC HISTORICAL  11/02/2016   IR FLUORO GUIDE CV LINE RIGHT 11/02/2016 Arne Cleveland, MD [facility]  . MASTECTOMY     right breast   Past Medical History:  Diagnosis Date  . Cancer [facility]) 1996   breast cx  . Hypertension    BP (!) 151/95   Pulse (!) 108   SpO2 95%   Opioid Risk Score:   Fall Risk Score:  `1  Depression screen PHQ 2/9  No flowsheet data found.  Review of Systems  Constitutional: Negative.   HENT: Negative.   Eyes: Negative.   Respiratory: Negative.  Cardiovascular: Negative.   Gastrointestinal: Negative.   Endocrine: Negative.   Genitourinary: Negative.   Musculoskeletal: Negative.   Skin: Negative.   Allergic/Immunologic: Negative.   Neurological: Negative.   Hematological: Negative.   Psychiatric/Behavioral: Negative.   All other systems reviewed and are negative.      Objective:   Physical Exam  HENT:  Craniotomy site clean and dry. Sutures out Eyes: EOMI  Neck: Normal range of motion. Neck supple. No thyromegaly present.  Cardiovascular: HR in 100's Respiratory: clear  GI: Soft. Bowel sounds are normal. She exhibits no distension.  Neurological: She is alert.   Follows simple commands. Functional communication. Speech clear. Language  language very Fluent. Standing without loss of balance, good  coordination. Tandem gait functional. Good insight and awarenes. Able to perform simple math/algebra Motor strength is 5/5 bilateral deltoid, bicep, tricep grip. 5/5 Bilateral hip flexor, knee extensor, ankle dorsiflexor.    Psych: pleasant and appropriate Ext: LUE with decreased swelling and a few remaining ecchymoses      Assessment & Plan:  1. Decreased functional mobility with cognitive deficits secondary to Left frontal lobe brain abscess             -HH PT, OT, SLLP  -discussed driving. Can practice supervised driving in parking lot  -may begin independence trials at home 2. DVT Prophylaxis/left subclavian DVT:             - elevation, continues use of left upper 3. Pain Management: NOW on tylenol only 4. Neuropsych: This patient is capable of making decisions on her own behalf. 6. Skin/Wound Care: Routine skin checks,              -all wounds healing nicely 7. Fluids/Electrolytes/Nutrition: encourage po intake             -need recent lab reports 8. ID/Streptococcus intermedius. Intravenous ceftriaxone and oral Flagyl 6 weeks ending 12/08/2016 and follow-up per infectious disease              -clinically improved            -Dr. Tommy Medal concerned by atypical infiltrate on path. Likely benign (?lymphoproliferative vs neoplastic process)                         -f/u MRI in a few weeks.  9. Seizure prophylaxis. Keppra 500 mg twice a day- no signs of seizure activity 10. Hypertension/CV             -increase toprol-XL  to 50mg              -can continue lotensin at 20mg              -hctz on hold  -if dizziness on increased dose, pt advised to back down to 25mg .     .Thirty minutes of face to face patient care time were spent during this visit. All questions were encouraged and answered. Follow up in one month.

## 2016-11-17 NOTE — Patient Instructions (Addendum)
RETURN TO DRIVING :  WITH THE SUPERVISION OF A LICENSED DRIVER, PLEASE DRIVE IN AN EMPTY PARKING LOT  TRIALS TO TEST REACTION TIME, VISION, USE OF EQUIPMENT IN CAR, ETC.  TRIALS OF "INDEPENDENCE" AT HOME FOR 3-4 HOURS DURING THE DAY TO TEST SAFETY.    PLEASE FEEL FREE TO CALL OUR OFFICE WITH ANY PROBLEMS OR QUESTIONS ([phone])

## 2016-11-25 DIAGNOSIS — Z79891 Long term (current) use of opiate analgesic: Secondary | ICD-10-CM | POA: Diagnosis not present

## 2016-11-25 DIAGNOSIS — B9689 Other specified bacterial agents as the cause of diseases classified elsewhere: Secondary | ICD-10-CM | POA: Diagnosis not present

## 2016-11-25 DIAGNOSIS — Z452 Encounter for adjustment and management of vascular access device: Secondary | ICD-10-CM | POA: Diagnosis not present

## 2016-11-25 DIAGNOSIS — Z853 Personal history of malignant neoplasm of breast: Secondary | ICD-10-CM | POA: Diagnosis not present

## 2016-11-25 DIAGNOSIS — E1165 Type 2 diabetes mellitus with hyperglycemia: Secondary | ICD-10-CM | POA: Diagnosis not present

## 2016-11-25 DIAGNOSIS — Z792 Long term (current) use of antibiotics: Secondary | ICD-10-CM | POA: Diagnosis not present

## 2016-11-25 DIAGNOSIS — T814XXA Infection following a procedure, initial encounter: Secondary | ICD-10-CM | POA: Diagnosis not present

## 2016-11-25 DIAGNOSIS — G060 Intracranial abscess and granuloma: Secondary | ICD-10-CM | POA: Diagnosis not present

## 2016-11-25 DIAGNOSIS — I10 Essential (primary) hypertension: Secondary | ICD-10-CM | POA: Diagnosis not present

## 2016-11-25 DIAGNOSIS — Z5181 Encounter for therapeutic drug level monitoring: Secondary | ICD-10-CM | POA: Diagnosis not present

## 2016-11-25 DIAGNOSIS — B954 Other streptococcus as the cause of diseases classified elsewhere: Secondary | ICD-10-CM | POA: Diagnosis not present

## 2016-12-02 ENCOUNTER — Telehealth: Payer: Self-pay | Admitting: *Deleted

## 2016-12-02 ENCOUNTER — Ambulatory Visit (INDEPENDENT_AMBULATORY_CARE_PROVIDER_SITE_OTHER): Payer: Medicare Other | Admitting: Internal Medicine

## 2016-12-02 ENCOUNTER — Encounter: Payer: Self-pay | Admitting: Internal Medicine

## 2016-12-02 DIAGNOSIS — I10 Essential (primary) hypertension: Secondary | ICD-10-CM

## 2016-12-02 DIAGNOSIS — G060 Intracranial abscess and granuloma: Secondary | ICD-10-CM

## 2016-12-02 MED ORDER — CEFTRIAXONE IV (FOR PTA / DISCHARGE USE ONLY): 2.0000 g | Freq: Two times a day (BID) | INTRAVENOUS | 0 refills | Status: AC

## 2016-12-02 MED ORDER — METRONIDAZOLE 500 MG PO TABS: 500.0000 mg | ORAL_TABLET | Freq: Four times a day (QID) | ORAL | 1 refills | Status: AC

## 2016-12-02 NOTE — Telephone Encounter (Signed)
Patient with tunneled PICC, IV antibiotics to be complete on 5/7.  Per verbal order from Dr Megan Salon, patient needs an appointment with IR on 5/8 to remove the PICC. Left message with Anderson Malta at IR to have this scheduled. Landis Gandy, RN

## 2016-12-02 NOTE — Assessment & Plan Note (Addendum)
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 She will complete 6 weeks of therapy on 12/08/2016 and I will arrange to have her central line removed. I am not sure what to make of the "atypical cellular infiltrates with plasmacytoid features". 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 I have discussed the case with Dr. Terrance Mass, her neurosurgeon.  His office will schedule a follow-up brain MRI approximately 3 months after her recent surgery. She has been on prophylactic Keppra. I will have her decrease the dose to 500 mg daily for one week then stop. She will follow-up with me in 4 weeks.

## 2016-12-02 NOTE — Assessment & Plan Note (Signed)
Her blood pressure today is extremely elevated. She reminds me that her blood pressure medications were stopped when she was in the hospital because her blood pressure was low. I asked her to contact Dr. Laurann Montana, her primary care provider, to determine if she needs to restart them now.

## 2016-12-02 NOTE — Progress Notes (Signed)
[place] for Infectious Disease  Patient Active Problem List   Diagnosis Date Noted  . Streptococcus infection     Priority: High  . Fusobacterium infection     Priority: High  . 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 Hyperglycemia 10/27/2016  . Hx of breast cancer 10/23/2016  . Essential hypertension 10/23/2016  . Hypokalemia 10/23/2016    Patient's Medications  New Prescriptions   No medications on file  Previous Medications   ALPRAZOLAM (XANAX) 0.25 MG TABLET    Take 0.125-0.25 mg by mouth at bedtime as needed for anxiety.    BENAZEPRIL (LOTENSIN) 20 MG TABLET    Take 1 tablet (20 mg total) by mouth daily.   METOPROLOL SUCCINATE (TOPROL XL) 50 MG 24 HR TABLET    Take 1 tablet (50 mg total) by mouth daily. Take with or immediately following a meal.   PANTOPRAZOLE (PROTONIX) 40 MG TABLET    Take 1 tablet (40 mg total) by mouth daily.  Modified Medications   Modified Medication Previous Medication   CEFTRIAXONE (ROCEPHIN) IVPB cefTRIAXone (ROCEPHIN) IVPB      Inject 2 g into the vein every 12 (twelve) hours. Indication:  Brain abscess Last Day of Therapy:  12/08/16 Labs - Once weekly:  CBC/Diff and BMP Labs - Every other week:  ESR and CRP    Inject 2 g into the vein every 12 (twelve) hours. Indication:  Brain abscess Last Day of Therapy:  12/08/16 Labs - Once weekly:  CBC/Diff and BMP Labs - Every other week:  ESR and CRP   LEVETIRACETAM (KEPPRA) 500 MG TABLET levETIRAcetam (KEPPRA) 500 MG tablet      Take 1 tablet (500 mg total) by mouth daily.    Take 1 tablet (500 mg total) by mouth 2 (two) times daily.   METRONIDAZOLE (FLAGYL) 500 MG TABLET metroNIDAZOLE (FLAGYL) 500 MG tablet      Take 1 tablet (500 mg total) by mouth 4 (four) times daily.    Take 1 tablet (500 mg total) by mouth 4 (four) times daily.  Discontinued Medications   No medications on file    Subjective: Alexandria [name] is in with her grandson, Alexandria [name], for her hospital follow-up visit.  She was admitted to the hospital in March and found to have a left frontal brain abscess. She underwent surgery and cultures grew Streptococcus intermedius and Fusobacterium. She was discharged to the rehabilitation unit on IV ceftriaxone and oral metronidazole. She is now completed 5 weeks of therapy. She has some nausea that she associates with metronidazole but has not had any vomiting. She does not want to take any anti-emetics because it makes her sleepy. Her speech is completely back to normal and her grandson states that her personality is almost back to normal. She still feels weak, particularly in her lower extremities. She is making progress with physical therapy.  Her final pathology revealed changes compatible with abscess as well as "atypical cellular infiltrates with lymphocytoid features". This prompted serologic testing for Epstein-Barr virus which came back in a pattern compatible with remote, inactive infection.  Review of Systems: Review of Systems  Constitutional: Negative for chills, diaphoresis and fever.  Respiratory: Negative for cough.   Cardiovascular: Negative for chest pain.  Gastrointestinal: Positive for nausea. Negative for abdominal pain, diarrhea and vomiting.       She has loose stools from irritable bowel syndrome but this pattern is unchanged from her normal baseline.  Skin: Negative for rash.  Neurological: Positive for weakness. Negative for speech change, focal weakness and headaches.    Past Medical History:  Diagnosis Date  . Cancer [facility]) 1996   breast cx  . Hypertension     Social History  Substance Use Topics  . Smoking status: Never Smoker  . Smokeless tobacco: Never Used  . Alcohol use Yes     Comment: occasional    Family History  Problem Relation Age of Onset  . Breast cancer Mother   . Colon cancer Father     Allergies  Allergen Reactions  . Wasp Venom Anaphylaxis  . Sulfa Antibiotics Other (See Comments)    Objective: Vitals:    12/02/16 1114 12/02/16 1115  BP: (!) 177/120 (!) 167/108  Pulse: 82 85  Temp: 97.7 F (36.5 C)   TempSrc: Oral   Weight: 160 lb (72.6 kg)   Height: '5\' 2"'  (1.575 m)    Body mass index is 29.26 kg/m.  Physical Exam  Constitutional: She is oriented to person, place, and time.  She is smiling and in good spirits.  Cardiovascular: Normal rate and regular rhythm.   No murmur heard. Pulmonary/Chest: Effort normal and breath sounds normal.  Abdominal: Soft. There is no tenderness.  Neurological: She is alert and oriented to person, place, and time.  Skin: No rash noted.  Her right anterior chest central line site shows a little bit of erythema around the exit site.  Psychiatric: Mood and affect normal.    Lab Results    Problem List Items Addressed This Visit      High   Brain abscess    She is improving on therapy for polymicrobial brain abscess. She will complete 6 weeks of therapy on 12/08/2016 and I will arrange to have her central line removed. I am not sure what to make of the "atypical cellular infiltrates with plasmacytoid features". 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 I have discussed the case with Dr. Terrance Mass, her neurosurgeon.  His office will schedule a follow-up brain MRI approximately 3 months after her recent surgery. She has been on prophylactic Keppra. I will have her decrease the dose to 500 mg daily for one week then stop. She will follow-up with me in 4 weeks.      Relevant Orders   PICC line removal     Unprioritized   Essential hypertension (Chronic)    Her blood pressure today is extremely elevated. She reminds me that her blood pressure medications were stopped when she was in the hospital because her blood pressure was low. I asked her to contact Dr. Laurann Montana, her primary care provider, to determine if she needs to restart them now.          Michel Bickers, MD [facility] for Infectious  [facility] [phone] pager   [phone] cell 12/03/2016, 11:14 AM

## 2016-12-03 ENCOUNTER — Other Ambulatory Visit (HOSPITAL_COMMUNITY): Payer: Self-pay

## 2016-12-03 ENCOUNTER — Other Ambulatory Visit: Payer: Self-pay | Admitting: Internal Medicine

## 2016-12-03 ENCOUNTER — Other Ambulatory Visit: Payer: Self-pay | Admitting: *Deleted

## 2016-12-03 DIAGNOSIS — G060 Intracranial abscess and granuloma: Secondary | ICD-10-CM

## 2016-12-03 MED ORDER — LEVETIRACETAM 500 MG PO TABS: 500.0000 mg | ORAL_TABLET | Freq: Every day | ORAL | 1 refills | Status: DC

## 2016-12-03 NOTE — Telephone Encounter (Signed)
I called and spoke with Alexandria [name] grandson, Alexandria [name]. I confirmed that she will stop antibiotics on 12/08/2016 and then have her central line removed by interventional radiology. I discussed the situation with her neurosurgeon, Dr. Vertell Limber, and his office will schedule a follow-up brain MRI approximately 3 months after her recent surgery.

## 2016-12-08 ENCOUNTER — Encounter: Payer: Self-pay | Admitting: Internal Medicine

## 2016-12-08 ENCOUNTER — Telehealth: Payer: Self-pay | Admitting: *Deleted

## 2016-12-08 NOTE — Telephone Encounter (Signed)
Calling to let Dr Naaman Plummer know they have discharged Alexandria [name] from [facility] because she is getting her line out tomorrow.  They drew blood today and he should be getting report.  If anything further needed he can put in a new order.

## 2016-12-09 ENCOUNTER — Ambulatory Visit (HOSPITAL_COMMUNITY): Admission: RE | Admit: 2016-12-09 | Payer: Medicare Other | Source: Ambulatory Visit

## 2016-12-10 ENCOUNTER — Ambulatory Visit (HOSPITAL_COMMUNITY)
Admission: RE | Admit: 2016-12-10 | Discharge: 2016-12-10 | Disposition: A | Discharge status: Home / Self Care | Payer: Medicare Other | Source: Ambulatory Visit | Attending: Internal Medicine | Admitting: Internal Medicine

## 2016-12-10 ENCOUNTER — Encounter (HOSPITAL_COMMUNITY): Payer: Self-pay | Admitting: Interventional Radiology

## 2016-12-10 DIAGNOSIS — G060 Intracranial abscess and granuloma: Secondary | ICD-10-CM

## 2016-12-10 DIAGNOSIS — Z452 Encounter for adjustment and management of vascular access device: Secondary | ICD-10-CM | POA: Insufficient documentation

## 2016-12-10 HISTORY — PX: IR REMOVAL TUN CV CATH W/O FL: IMG2289

## 2016-12-10 NOTE — Procedures (Signed)
Successful removal of tunneled PICC.   EBL: None No immediate complications.  Jay Leona Pressly, MD Pager #: [phone]   

## 2016-12-11 ENCOUNTER — Other Ambulatory Visit: Payer: Self-pay | Admitting: Physical Medicine and Rehabilitation

## 2016-12-18 ENCOUNTER — Other Ambulatory Visit: Payer: Self-pay

## 2016-12-18 MED ORDER — BENAZEPRIL HCL 20 MG PO TABS: 20.0000 mg | ORAL_TABLET | Freq: Every day | ORAL | 0 refills | Status: DC

## 2016-12-24 ENCOUNTER — Encounter: Payer: Self-pay | Admitting: Physical Medicine & Rehabilitation

## 2016-12-24 ENCOUNTER — Encounter: Payer: Medicare Other | Attending: Physical Medicine & Rehabilitation | Admitting: Physical Medicine & Rehabilitation

## 2016-12-24 VITALS — BP 139/87 | HR 101 | Resp 14

## 2016-12-24 DIAGNOSIS — R419 Unspecified symptoms and signs involving cognitive functions and awareness: Secondary | ICD-10-CM | POA: Insufficient documentation

## 2016-12-24 DIAGNOSIS — G060 Intracranial abscess and granuloma: Secondary | ICD-10-CM | POA: Diagnosis not present

## 2016-12-24 DIAGNOSIS — Z803 Family history of malignant neoplasm of breast: Secondary | ICD-10-CM | POA: Insufficient documentation

## 2016-12-24 DIAGNOSIS — K219 Gastro-esophageal reflux disease without esophagitis: Secondary | ICD-10-CM | POA: Diagnosis not present

## 2016-12-24 DIAGNOSIS — Z853 Personal history of malignant neoplasm of breast: Secondary | ICD-10-CM | POA: Diagnosis not present

## 2016-12-24 DIAGNOSIS — Z800 Family history of malignant neoplasm of digestive organs: Secondary | ICD-10-CM | POA: Insufficient documentation

## 2016-12-24 DIAGNOSIS — I10 Essential (primary) hypertension: Secondary | ICD-10-CM | POA: Insufficient documentation

## 2016-12-24 DIAGNOSIS — Z79899 Other long term (current) drug therapy: Secondary | ICD-10-CM | POA: Diagnosis not present

## 2016-12-24 MED ORDER — PANTOPRAZOLE SODIUM 40 MG PO TBEC: 40.0000 mg | DELAYED_RELEASE_TABLET | Freq: Every day | ORAL | 5 refills | Status: AC

## 2016-12-24 NOTE — Patient Instructions (Signed)
CONTINUE TO WORK ON YOUR HIGHER LEVEL BALANCE AND STAMINA  PLEASE FEEL FREE TO CALL OUR OFFICE WITH ANY PROBLEMS OR QUESTIONS ([phone])

## 2016-12-24 NOTE — Progress Notes (Signed)
Subjective:    Patient ID: Alexandria [name], female    DOB: [birth year], 71 y.o.   MRN: [id number]  HPI   Mrs. Plouffe is here in follow up of her left brain abscess. She has been doing well. She has started yoga, slowly but surely. She is independent at home. She has her grandchildren at times. She is navigating the stairs. She hasn't fallen. There have been no problems driving. She denies any headaches/pain  ID is following along with another MRI scheduled soon.    Pain Inventory Average Pain 0 Pain Right Now 0 My pain is no pain  In the last 24 hours, has pain interfered with the following? General activity 0 Relation with others 0 Enjoyment of life 0 What TIME of day is your pain at its worst? no pain Sleep (in general) Good  Pain is worse with: no pain Pain improves with: no pain Relief from Meds: no pain  Mobility walk without assistance ability to climb steps?  yes do you drive?  yes  Function retired  Neuro/Psych No problems in this area  Prior Studies Any changes since last visit?  no  Physicians involved in your care Any changes since last visit?  no   Family History  Problem Relation Age of Onset  . Breast cancer Mother   . Colon cancer Father    Social History   Social History  . Marital status: Single    Spouse name: N/A  . Number of children: N/A  . Years of education: N/A   Occupational History  . retired Freight forwarder    Social History Main Topics  . Smoking status: Never Smoker  . Smokeless tobacco: Never Used  . Alcohol use Yes     Comment: occasional  . Drug use: No  . Sexual activity: Not Currently   Other Topics Concern  . None   Social History Narrative   1 lifetime sexual partner, last intercourse about 7 years ago?  No drugs including illicits.   Past Surgical History:  Procedure Laterality Date  . ABDOMINAL HYSTERECTOMY    . APPLICATION OF CRANIAL NAVIGATION N/A 10/27/2016   Procedure: APPLICATION OF CRANIAL  NAVIGATION;  Surgeon: Erline Levine, MD;  Location: [place];  Service: Neurosurgery;  Laterality: N/A;  . CRANIOTOMY Left 10/27/2016   Procedure: CRANIOTOMY TUMOR EXCISION;  Surgeon: Erline Levine, MD;  Location: [place];  Service: Neurosurgery;  Laterality: Left;  . IR GENERIC HISTORICAL  11/02/2016   IR US GUIDE VASC ACCESS RIGHT 11/02/2016 Arne Cleveland, MD [facility]  . IR GENERIC HISTORICAL  11/02/2016   IR FLUORO GUIDE CV LINE RIGHT 11/02/2016 Arne Cleveland, MD [facility]  . IR REMOVAL TUN CV CATH W/O FL  12/10/2016  . MASTECTOMY     right breast   Past Medical History:  Diagnosis Date  . Cancer [facility]) 1996   breast cx  . Hypertension    BP 139/87 (BP Location: Left Arm, Patient Position: Sitting, Cuff Size: Large)   Pulse (!) 101   Resp 14   SpO2 97%   Opioid Risk Score:   Fall Risk Score:  `1  Depression screen PHQ 2/9  Depression screen PHQ 2/9 12/02/2016  Decreased Interest 0  Down, Depressed, Hopeless 0  PHQ - 2 Score 0    Review of Systems  Constitutional: Negative.   HENT: Negative.   Respiratory: Negative.   Cardiovascular: Negative.   Gastrointestinal: Negative.   Endocrine: Negative.   Genitourinary: Negative.   Musculoskeletal:  Negative.   Skin: Negative.   Allergic/Immunologic: Negative.   Neurological: Negative.   Hematological: Negative.   Psychiatric/Behavioral: Negative.   All other systems reviewed and are negative.      Objective:   Physical Exam HENT:  Crani site healed. Eyes: EOMI  Neck: Normal range of motion. Neck supple. No thyromegaly present.  Cardiovascular: RRR Respiratory: normal effort  GI: Soft. Bowel sounds are normal. She exhibits no distension.  Neurological: She is alert.  Normal insight and awareness. Motor strength is 5/5 bilateral deltoid, bicep, tricep grip.  5/5 Bilateral hip flexor, knee extensor, ankle dorsiflexor. Romberg mildly positive. Tandem gait slightly unsteady.  Psych: pleasant and appropriate Ext: LUE with  decreased swelling and a few remaining ecchymoses      Assessment & Plan:  1. Decreased functional mobility with cognitive deficits secondary to Left frontal lobe brain abscess -HEP             -local driving only  -Yoga for balance is an excellent option 2. DVT Prophylaxis/left subclavian DVT: - clinically resolved 3. ID/Streptococcus intermedius.   -f/u MRI per ID/NS 9. Seizure prophylaxis. Keppra 500 mg daily 10. Hypertension/CV--much improved -continue toprol-XL  to 50mg  - continue lotensin at 20mg  -hctz on hold                  15 minutes of face to face patient care time were spent during this visit. All questions were encouraged and answered. Follow up as needed. Greater than 50% of time during this encounter was spent counseling patient/family in regard to driving/safety/HEP.

## 2016-12-30 ENCOUNTER — Encounter: Payer: Self-pay | Admitting: Internal Medicine

## 2016-12-30 ENCOUNTER — Ambulatory Visit (INDEPENDENT_AMBULATORY_CARE_PROVIDER_SITE_OTHER): Payer: Medicare Other | Admitting: Internal Medicine

## 2016-12-30 DIAGNOSIS — G060 Intracranial abscess and granuloma: Secondary | ICD-10-CM

## 2016-12-30 NOTE — Assessment & Plan Note (Signed)
I am hopeful that her brain abscess has been cured. She probably had an allergic reaction to one of her antibiotics but has now resolved. She will follow-up here after repeat brain MRI in about 2 months.

## 2016-12-30 NOTE — Progress Notes (Signed)
[place] for Infectious Disease  Patient Active Problem List   Diagnosis Date Noted  . Streptococcus infection     Priority: High  . Fusobacterium infection     Priority: High  . 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 GERD (gastroesophageal reflux disease) 12/24/2016  . Hyperglycemia 10/27/2016  . Hx of breast cancer 10/23/2016  . Essential hypertension 10/23/2016  . Hypokalemia 10/23/2016    Patient's Medications  New Prescriptions   No medications on file  Previous Medications   ALPRAZOLAM (XANAX) 0.25 MG TABLET    Take 0.125-0.25 mg by mouth at bedtime as needed for anxiety.    BENAZEPRIL (LOTENSIN) 20 MG TABLET    Take 1 tablet (20 mg total) by mouth daily.   METOPROLOL SUCCINATE (TOPROL XL) 50 MG 24 HR TABLET    Take 1 tablet (50 mg total) by mouth daily. Take with or immediately following a meal.   PANTOPRAZOLE (PROTONIX) 40 MG TABLET    Take 1 tablet (40 mg total) by mouth daily.   TRIAMCINOLONE CREAM (KENALOG) 0.1 %      Modified Medications   No medications on file  Discontinued Medications   LEVETIRACETAM (KEPPRA) 500 MG TABLET    Take 1 tablet (500 mg total) by mouth daily.    Subjective: Alexandria [name] is in for her routine follow-up visit. She completed 6 weeks of empiric IV ceftriaxone and metronidazole on 12/08/2016. On the last day of her antibiotic she developed a diffuse, red itchy rash. She was still on her Keppra at that time as well. The rash slowly resolved over the next week. Other than that she is feeling much better. Her speech is back to normal and she has resumed yoga therapy.  Review of Systems: Review of Systems  Constitutional: Negative for chills, diaphoresis and fever.  Gastrointestinal: Negative for abdominal pain, diarrhea, nausea and vomiting.  Skin: Positive for itching and rash.  Neurological: Negative for dizziness, speech change, focal weakness and headaches.    Past Medical History:  Diagnosis Date  .  Cancer [facility]) 1996   breast cx  . Hypertension     Social History  Substance Use Topics  . Smoking status: Never Smoker  . Smokeless tobacco: Never Used  . Alcohol use Yes     Comment: occasional    Family History  Problem Relation Age of Onset  . Breast cancer Mother   . Colon cancer Father     Allergies  Allergen Reactions  . Wasp Venom Anaphylaxis  . Sulfa Antibiotics Other (See Comments)    Objective: Vitals:   12/30/16 0852  BP: (!) 143/95  Pulse: 98  Temp: 98.1 F (36.7 C)  TempSrc: Oral  Weight: 155 lb (70.3 kg)  Height: 5' 1.5" (1.562 m)   Body mass index is 28.81 kg/m.  Physical Exam  Constitutional: She is oriented to person, place, and time.  She is smiling and in good spirits.  Cardiovascular: Normal rate and regular rhythm.   No murmur heard. Pulmonary/Chest: Effort normal and breath sounds normal.  Abdominal: Soft. There is no tenderness.  Neurological: She is alert and oriented to person, place, and time. Gait normal.  Normal speech.  Skin: No rash noted.  Psychiatric: Mood and affect normal.    Lab Results    Problem List Items Addressed This Visit      High   Brain abscess    I am hopeful that her brain abscess  has been cured. She probably had an allergic reaction to one of her antibiotics but has now resolved. She will follow-up here after repeat brain MRI in about 2 months.          Michel Bickers, MD [facility] [phone] pager   [phone] cell 12/30/2016, 9:10 AM

## 2017-01-02 ENCOUNTER — Other Ambulatory Visit: Payer: Self-pay | Admitting: Neurosurgery

## 2017-01-02 DIAGNOSIS — G060 Intracranial abscess and granuloma: Secondary | ICD-10-CM

## 2017-01-05 ENCOUNTER — Encounter: Payer: Self-pay | Admitting: Internal Medicine

## 2017-01-06 ENCOUNTER — Encounter: Payer: Self-pay | Admitting: Internal Medicine

## 2017-01-06 NOTE — Progress Notes (Signed)
Letter sent.

## 2017-02-03 ENCOUNTER — Ambulatory Visit
Admission: RE | Admit: 2017-02-03 | Discharge: 2017-02-03 | Disposition: A | Discharge status: Home / Self Care | Payer: Medicare Other | Source: Ambulatory Visit | Attending: Neurosurgery | Admitting: Neurosurgery

## 2017-02-03 DIAGNOSIS — G060 Intracranial abscess and granuloma: Secondary | ICD-10-CM

## 2017-02-03 MED ORDER — GADOBENATE DIMEGLUMINE 529 MG/ML IV SOLN
14.0000 mL | Freq: Once | INTRAVENOUS | Status: AC | PRN
  Administered 2017-02-03: 14 mL via INTRAVENOUS

## 2017-02-05 ENCOUNTER — Telehealth: Payer: Self-pay | Admitting: Internal Medicine

## 2017-02-05 NOTE — Telephone Encounter (Signed)
I spoke to Alexandria [name] today. She still notes some slight imbalance if she is walking in a dark place but otherwise feels completely back to normal. I let her know that her MRI shows no evidence of residual abscess or mass. I asked her to call me if she has any further problems or questions suggesting recurrence of her brain abscess. I believe she has been cured.

## 2017-03-03 ENCOUNTER — Ambulatory Visit (INDEPENDENT_AMBULATORY_CARE_PROVIDER_SITE_OTHER): Payer: Medicare Other | Admitting: Internal Medicine

## 2017-03-03 ENCOUNTER — Encounter: Payer: Self-pay | Admitting: Internal Medicine

## 2017-03-03 DIAGNOSIS — G060 Intracranial abscess and granuloma: Secondary | ICD-10-CM

## 2017-03-03 NOTE — Progress Notes (Signed)
[facility]  Patient Active Problem List   Diagnosis Date Noted  . Streptococcus infection     Priority: High  . Fusobacterium infection     Priority: High  . 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 GERD (gastroesophageal reflux disease) 12/24/2016  . Hyperglycemia 10/27/2016  . Hx of breast cancer 10/23/2016  . Essential hypertension 10/23/2016  . Hypokalemia 10/23/2016    Patient's Medications  New Prescriptions   No medications on file  Previous Medications   ALPRAZOLAM (XANAX) 0.25 MG TABLET    Take 0.125-0.25 mg by mouth at bedtime as needed for anxiety.    BENAZEPRIL (LOTENSIN) 20 MG TABLET    Take 1 tablet (20 mg total) by mouth daily.   METOPROLOL SUCCINATE (TOPROL XL) 50 MG 24 HR TABLET    Take 1 tablet (50 mg total) by mouth daily. Take with or immediately following a meal.   PANTOPRAZOLE (PROTONIX) 40 MG TABLET    Take 1 tablet (40 mg total) by mouth daily.   RALOXIFENE (EVISTA) 60 MG TABLET    Take 60 mg by mouth daily.   TRIAMCINOLONE CREAM (KENALOG) 0.1 %      Modified Medications   No medications on file  Discontinued Medications   No medications on file    Subjective: Alexandria [name] is in for her routine follow-up visit. She completed 6 weeks of IV antibiotics for her polymicrobial brain abscess on 12/08/2016. She states that she is now feeling back to normal. She has resumed her usual activities including yoga.  Review of Systems: Review of Systems  Constitutional: Negative for chills, diaphoresis and fever.  Gastrointestinal: Negative for abdominal pain, diarrhea, nausea and vomiting.  Neurological: Negative for dizziness, speech change and headaches.    Past Medical History:  Diagnosis Date  . Cancer [facility]) 1996   breast cx  . Hypertension     Social History  Substance Use Topics  . Smoking status: Never Smoker  . Smokeless tobacco: Never Used  . Alcohol use Yes     Comment: occasional    Family  History  Problem Relation Age of Onset  . Breast cancer Mother   . Colon cancer Father     Allergies  Allergen Reactions  . Wasp Venom Anaphylaxis  . Sulfa Antibiotics Other (See Comments)    Objective: Vitals:   03/03/17 0939  BP: (!) 152/90  Pulse: 89  Temp: 98 F (36.7 C)  TempSrc: Oral  Weight: 155 lb (70.3 kg)  Height: 5' 1.5" (1.562 m)   Body mass index is 28.81 kg/m.  Physical Exam  Constitutional: She is oriented to person, place, and time.  She is smiling and in good spirits.  Musculoskeletal: Normal range of motion. She exhibits no edema or tenderness.  Neurological: She is alert and oriented to person, place, and time. Gait normal.  Her speech is fluent and normal.  Psychiatric: Mood and affect normal.    Lab Results  MR brain with and without contrast 02/03/2017  IMPRESSION: Interval postoperative changes from left frontal abscess drainage. No evidence of residual/ recurrent abscess or acute abnormality.   Electronically Signed   By: Logan Bores M.D.   On: 02/03/2017 11:32   Problem List Items Addressed This Visit      High   Brain abscess    It appears that her brain abscess has been cured with a combination of surgery and antibiotic therapy. She can  follow-up here as needed.          Alexandria Bickers, MD [facility] [phone] pager   [phone] cell 03/03/2017, 9:53 AM

## 2017-03-03 NOTE — Assessment & Plan Note (Signed)
It appears that her brain abscess has been cured with a combination of surgery and antibiotic therapy. She can follow-up here as needed.

## 2017-03-17 ENCOUNTER — Inpatient Hospital Stay (HOSPITAL_COMMUNITY)
Admission: AD | Admit: 2017-03-17 | Discharge: 2017-03-27 | DRG: 023 | Disposition: A | Discharge status: Other Acute Inpatient Hospital | Payer: Medicare Other | Source: Ambulatory Visit | Attending: Neurological Surgery | Admitting: Neurological Surgery

## 2017-03-17 ENCOUNTER — Observation Stay (HOSPITAL_COMMUNITY): Payer: Medicare Other

## 2017-03-17 ENCOUNTER — Encounter (HOSPITAL_COMMUNITY): Payer: Self-pay | Admitting: Physician Assistant

## 2017-03-17 DIAGNOSIS — R6521 Severe sepsis with septic shock: Secondary | ICD-10-CM | POA: Diagnosis present

## 2017-03-17 DIAGNOSIS — G060 Intracranial abscess and granuloma: Principal | ICD-10-CM

## 2017-03-17 DIAGNOSIS — T847XXA Infection and inflammatory reaction due to other internal orthopedic prosthetic devices, implants and grafts, initial encounter: Secondary | ICD-10-CM

## 2017-03-17 DIAGNOSIS — F502 Bulimia nervosa, unspecified: Secondary | ICD-10-CM

## 2017-03-17 DIAGNOSIS — A491 Streptococcal infection, unspecified site: Secondary | ICD-10-CM | POA: Diagnosis not present

## 2017-03-17 DIAGNOSIS — T17908A Unspecified foreign body in respiratory tract, part unspecified causing other injury, initial encounter: Secondary | ICD-10-CM

## 2017-03-17 DIAGNOSIS — Z86718 Personal history of other venous thrombosis and embolism: Secondary | ICD-10-CM | POA: Diagnosis not present

## 2017-03-17 DIAGNOSIS — R627 Adult failure to thrive: Secondary | ICD-10-CM | POA: Diagnosis present

## 2017-03-17 DIAGNOSIS — K5710 Diverticulosis of small intestine without perforation or abscess without bleeding: Secondary | ICD-10-CM | POA: Diagnosis present

## 2017-03-17 DIAGNOSIS — J690 Pneumonitis due to inhalation of food and vomit: Secondary | ICD-10-CM | POA: Diagnosis present

## 2017-03-17 DIAGNOSIS — L0291 Cutaneous abscess, unspecified: Secondary | ICD-10-CM | POA: Diagnosis not present

## 2017-03-17 DIAGNOSIS — A419 Sepsis, unspecified organism: Secondary | ICD-10-CM | POA: Diagnosis not present

## 2017-03-17 DIAGNOSIS — K219 Gastro-esophageal reflux disease without esophagitis: Secondary | ICD-10-CM | POA: Diagnosis present

## 2017-03-17 DIAGNOSIS — Z967 Presence of other bone and tendon implants: Secondary | ICD-10-CM

## 2017-03-17 DIAGNOSIS — Z6829 Body mass index (BMI) 29.0-29.9, adult: Secondary | ICD-10-CM

## 2017-03-17 DIAGNOSIS — Z91048 Other nonmedicinal substance allergy status: Secondary | ICD-10-CM

## 2017-03-17 DIAGNOSIS — Z8661 Personal history of infections of the central nervous system: Secondary | ICD-10-CM

## 2017-03-17 DIAGNOSIS — Z79899 Other long term (current) drug therapy: Secondary | ICD-10-CM

## 2017-03-17 DIAGNOSIS — K7689 Other specified diseases of liver: Secondary | ICD-10-CM | POA: Diagnosis present

## 2017-03-17 DIAGNOSIS — L299 Pruritus, unspecified: Secondary | ICD-10-CM | POA: Diagnosis present

## 2017-03-17 DIAGNOSIS — T17918A Gastric contents in respiratory tract, part unspecified causing other injury, initial encounter: Secondary | ICD-10-CM | POA: Diagnosis not present

## 2017-03-17 DIAGNOSIS — Z01818 Encounter for other preprocedural examination: Secondary | ICD-10-CM

## 2017-03-17 DIAGNOSIS — Z4659 Encounter for fitting and adjustment of other gastrointestinal appliance and device: Secondary | ICD-10-CM

## 2017-03-17 DIAGNOSIS — Z452 Encounter for adjustment and management of vascular access device: Secondary | ICD-10-CM

## 2017-03-17 DIAGNOSIS — M868X8 Other osteomyelitis, other site: Secondary | ICD-10-CM | POA: Diagnosis present

## 2017-03-17 DIAGNOSIS — Z800 Family history of malignant neoplasm of digestive organs: Secondary | ICD-10-CM

## 2017-03-17 DIAGNOSIS — R1110 Vomiting, unspecified: Secondary | ICD-10-CM

## 2017-03-17 DIAGNOSIS — J9690 Respiratory failure, unspecified, unspecified whether with hypoxia or hypercapnia: Secondary | ICD-10-CM

## 2017-03-17 DIAGNOSIS — R948 Abnormal results of function studies of other organs and systems: Secondary | ICD-10-CM | POA: Diagnosis present

## 2017-03-17 DIAGNOSIS — K2970 Gastritis, unspecified, without bleeding: Secondary | ICD-10-CM | POA: Diagnosis present

## 2017-03-17 DIAGNOSIS — R000 Tachycardia, unspecified: Secondary | ICD-10-CM | POA: Diagnosis not present

## 2017-03-17 DIAGNOSIS — J9601 Acute respiratory failure with hypoxia: Secondary | ICD-10-CM | POA: Diagnosis not present

## 2017-03-17 DIAGNOSIS — J9600 Acute respiratory failure, unspecified whether with hypoxia or hypercapnia: Secondary | ICD-10-CM

## 2017-03-17 DIAGNOSIS — E43 Unspecified severe protein-calorie malnutrition: Secondary | ICD-10-CM | POA: Diagnosis not present

## 2017-03-17 DIAGNOSIS — T8149XA Infection following a procedure, other surgical site, initial encounter: Secondary | ICD-10-CM

## 2017-03-17 DIAGNOSIS — A498 Other bacterial infections of unspecified site: Secondary | ICD-10-CM

## 2017-03-17 DIAGNOSIS — K449 Diaphragmatic hernia without obstruction or gangrene: Secondary | ICD-10-CM | POA: Diagnosis present

## 2017-03-17 DIAGNOSIS — R112 Nausea with vomiting, unspecified: Secondary | ICD-10-CM

## 2017-03-17 DIAGNOSIS — Z881 Allergy status to other antibiotic agents status: Secondary | ICD-10-CM | POA: Diagnosis not present

## 2017-03-17 DIAGNOSIS — J95851 Ventilator associated pneumonia: Secondary | ICD-10-CM | POA: Diagnosis not present

## 2017-03-17 DIAGNOSIS — R0602 Shortness of breath: Secondary | ICD-10-CM

## 2017-03-17 DIAGNOSIS — Z882 Allergy status to sulfonamides status: Secondary | ICD-10-CM

## 2017-03-17 DIAGNOSIS — K311 Adult hypertrophic pyloric stenosis: Secondary | ICD-10-CM | POA: Diagnosis present

## 2017-03-17 DIAGNOSIS — Z853 Personal history of malignant neoplasm of breast: Secondary | ICD-10-CM

## 2017-03-17 DIAGNOSIS — Y848 Other medical procedures as the cause of abnormal reaction of the patient, or of later complication, without mention of misadventure at the time of the procedure: Secondary | ICD-10-CM | POA: Diagnosis present

## 2017-03-17 DIAGNOSIS — T86832 Bone graft infection: Secondary | ICD-10-CM | POA: Diagnosis present

## 2017-03-17 DIAGNOSIS — T847XXD Infection and inflammatory reaction due to other internal orthopedic prosthetic devices, implants and grafts, subsequent encounter: Secondary | ICD-10-CM | POA: Diagnosis not present

## 2017-03-17 DIAGNOSIS — T814XXA Infection following a procedure, initial encounter: Secondary | ICD-10-CM | POA: Diagnosis present

## 2017-03-17 DIAGNOSIS — T814XXD Infection following a procedure, subsequent encounter: Secondary | ICD-10-CM

## 2017-03-17 DIAGNOSIS — E878 Other disorders of electrolyte and fluid balance, not elsewhere classified: Secondary | ICD-10-CM | POA: Diagnosis not present

## 2017-03-17 DIAGNOSIS — R0902 Hypoxemia: Secondary | ICD-10-CM

## 2017-03-17 DIAGNOSIS — E876 Hypokalemia: Secondary | ICD-10-CM | POA: Diagnosis not present

## 2017-03-17 DIAGNOSIS — J90 Pleural effusion, not elsewhere classified: Secondary | ICD-10-CM | POA: Diagnosis present

## 2017-03-17 DIAGNOSIS — I10 Essential (primary) hypertension: Secondary | ICD-10-CM | POA: Diagnosis present

## 2017-03-17 DIAGNOSIS — Z9011 Acquired absence of right breast and nipple: Secondary | ICD-10-CM

## 2017-03-17 DIAGNOSIS — R34 Anuria and oliguria: Secondary | ICD-10-CM | POA: Diagnosis not present

## 2017-03-17 DIAGNOSIS — Z90710 Acquired absence of both cervix and uterus: Secondary | ICD-10-CM

## 2017-03-17 DIAGNOSIS — G8918 Other acute postprocedural pain: Secondary | ICD-10-CM | POA: Diagnosis present

## 2017-03-17 DIAGNOSIS — Z803 Family history of malignant neoplasm of breast: Secondary | ICD-10-CM

## 2017-03-17 HISTORY — DX: Nausea with vomiting, unspecified: R11.2

## 2017-03-17 HISTORY — DX: Other specified postprocedural states: Z98.890

## 2017-03-17 LAB — COMPREHENSIVE METABOLIC PANEL
ALT: 15 U/L (ref 14–54)
AST: 20 U/L (ref 15–41)
Albumin: 3.7 g/dL (ref 3.5–5.0)
Alkaline Phosphatase: 68 U/L (ref 38–126)
Anion gap: 10 (ref 5–15)
BUN: 8 mg/dL (ref 6–20)
CO2: 24 mmol/L (ref 22–32)
Calcium: 9.4 mg/dL (ref 8.9–10.3)
Chloride: 105 mmol/L (ref 101–111)
Creatinine, Ser: 0.8 mg/dL (ref 0.44–1.00)
GFR calc Af Amer: >60 mL/min (ref >60)
GFR calc non Af Amer: >60 mL/min (ref >60)
Glucose, Bld: 139 mg/dL — ABNORMAL HIGH (ref 65–99)
Potassium: 3 mmol/L — ABNORMAL LOW (ref 3.5–5.1)
Sodium: 139 mmol/L (ref 135–145)
Total Bilirubin: 0.8 mg/dL (ref 0.3–1.2)
Total Protein: 7 g/dL (ref 6.5–8.1)

## 2017-03-17 LAB — CBC
HCT: 40 % (ref 36.0–46.0)
Hemoglobin: 13.4 g/dL (ref 12.0–15.0)
MCH: 28.6 pg (ref 26.0–34.0)
MCHC: 33.5 g/dL (ref 30.0–36.0)
MCV: 85.3 fL (ref 78.0–100.0)
Platelets: 175 10*3/uL (ref 150–400)
RBC: 4.69 MIL/uL (ref 3.87–5.11)
RDW: 13.4 % (ref 11.5–15.5)
WBC: 8.2 10*3/uL (ref 4.0–10.5)

## 2017-03-17 LAB — SEDIMENTATION RATE: Sed Rate: 93 mm/h — ABNORMAL HIGH (ref 0–22)

## 2017-03-17 LAB — C-REACTIVE PROTEIN: CRP: 15.3 mg/dL — ABNORMAL HIGH (ref <1.0)

## 2017-03-17 MED ORDER — IOPAMIDOL (ISOVUE-300) INJECTION 61%
INTRAVENOUS | Status: AC
  Administered 2017-03-17: 75 mL
  Filled 2017-03-17: qty 75

## 2017-03-17 MED ORDER — BISACODYL 10 MG RE SUPP: 10.0000 mg | Freq: Every day | RECTAL | Status: DC | PRN

## 2017-03-17 MED ORDER — FLEET ENEMA 7-19 GM/118ML RE ENEM: 1.0000 | ENEMA | Freq: Once | RECTAL | Status: DC | PRN

## 2017-03-17 MED ORDER — SENNOSIDES-DOCUSATE SODIUM 8.6-50 MG PO TABS: 1.0000 | ORAL_TABLET | Freq: Every evening | ORAL | Status: DC | PRN

## 2017-03-17 MED ORDER — ONDANSETRON HCL 4 MG PO TABS: 4.0000 mg | ORAL_TABLET | Freq: Four times a day (QID) | ORAL | Status: DC | PRN

## 2017-03-17 MED ORDER — SACCHAROMYCES BOULARDII 250 MG PO CAPS
250.0000 mg | ORAL_CAPSULE | Freq: Two times a day (BID) | ORAL | Status: DC
  Administered 2017-03-17 – 2017-03-20 (×4): 250 mg via ORAL
  Filled 2017-03-17 (×9): qty 1

## 2017-03-17 MED ORDER — BENAZEPRIL HCL 20 MG PO TABS
20.0000 mg | ORAL_TABLET | Freq: Every day | ORAL | Status: DC
  Administered 2017-03-19: 20 mg via ORAL
  Filled 2017-03-17 (×3): qty 1

## 2017-03-17 MED ORDER — METOPROLOL SUCCINATE ER 50 MG PO TB24
50.0000 mg | ORAL_TABLET | Freq: Every day | ORAL | Status: DC
  Administered 2017-03-17 – 2017-03-19 (×3): 50 mg via ORAL
  Filled 2017-03-17 (×4): qty 1

## 2017-03-17 MED ORDER — GADOBENATE DIMEGLUMINE 529 MG/ML IV SOLN
14.0000 mL | Freq: Once | INTRAVENOUS | Status: AC | PRN
  Administered 2017-03-17: 14 mL via INTRAVENOUS

## 2017-03-17 MED ORDER — HYDROCODONE-ACETAMINOPHEN 5-325 MG PO TABS
1.0000 | ORAL_TABLET | ORAL | Status: DC | PRN
  Administered 2017-03-18: 1 via ORAL

## 2017-03-17 MED ORDER — DOCUSATE SODIUM 100 MG PO CAPS
100.0000 mg | ORAL_CAPSULE | Freq: Two times a day (BID) | ORAL | Status: DC
  Administered 2017-03-19: 100 mg via ORAL
  Filled 2017-03-17 (×5): qty 1

## 2017-03-17 MED ORDER — ACETAMINOPHEN 650 MG RE SUPP: 650.0000 mg | Freq: Four times a day (QID) | RECTAL | Status: DC | PRN

## 2017-03-17 MED ORDER — SODIUM CHLORIDE 0.9% FLUSH: 3.0000 mL | INTRAVENOUS | Status: DC | PRN

## 2017-03-17 MED ORDER — ONDANSETRON HCL 4 MG/2ML IJ SOLN: 4.0000 mg | Freq: Four times a day (QID) | INTRAMUSCULAR | Status: DC | PRN

## 2017-03-17 MED ORDER — RALOXIFENE HCL 60 MG PO TABS
60.0000 mg | ORAL_TABLET | Freq: Every day | ORAL | Status: DC
  Administered 2017-03-19: 60 mg via ORAL
  Filled 2017-03-17 (×3): qty 1

## 2017-03-17 MED ORDER — ZOLPIDEM TARTRATE 5 MG PO TABS: 5.0000 mg | ORAL_TABLET | Freq: Every evening | ORAL | Status: DC | PRN

## 2017-03-17 MED ORDER — SODIUM CHLORIDE 0.9% FLUSH
3.0000 mL | Freq: Two times a day (BID) | INTRAVENOUS | Status: DC
  Administered 2017-03-19 – 2017-03-23 (×7): 3 mL via INTRAVENOUS

## 2017-03-17 MED ORDER — PANTOPRAZOLE SODIUM 40 MG PO TBEC
40.0000 mg | DELAYED_RELEASE_TABLET | Freq: Every day | ORAL | Status: DC
Start: 2017-03-17 — End: 2017-03-20
  Administered 2017-03-19: 40 mg via ORAL
  Filled 2017-03-17 (×2): qty 1

## 2017-03-17 MED ORDER — ACETAMINOPHEN 325 MG PO TABS: 650.0000 mg | ORAL_TABLET | Freq: Four times a day (QID) | ORAL | Status: DC | PRN

## 2017-03-17 MED ORDER — LORAZEPAM 2 MG/ML IJ SOLN: 1.0000 mg | Freq: Once | INTRAMUSCULAR | Status: DC

## 2017-03-17 MED ORDER — LORAZEPAM 2 MG/ML IJ SOLN
1.0000 mg | Freq: Once | INTRAMUSCULAR | Status: AC | PRN
  Administered 2017-03-17: 1 mg via INTRAVENOUS
  Filled 2017-03-17: qty 1

## 2017-03-17 MED ORDER — SODIUM CHLORIDE 0.9 % IV SOLN
INTRAVENOUS | Status: DC
  Administered 2017-03-17 – 2017-03-18 (×2): via INTRAVENOUS

## 2017-03-17 MED ORDER — SODIUM CHLORIDE 0.9 % IV SOLN: 250.0000 mL | INTRAVENOUS | Status: DC | PRN

## 2017-03-17 NOTE — H&P (Signed)
Chief Complaint   No chief complaint on file. Wound drainage  HPI   HPI: Alexandria [name] is a 71 y.o. female who presented today to outpt clinic for wound check. She is s/p craniotomy 10/27/2016 by Dr Vertell Limber for brain abscess requiring PICC line and extended course of IV antibiotics - Followed by Dr Megan Salon (ID). Just had a brain MRI 02/03/2017 which did not show any residual or recurrent abscess. Several days ago, she noticed swelling at her craniotomy site. Advised to continue to monitor the site and call for elevated temp/drainage. Called early this morning due to low grade temp (99.9) and purulent discharge from the wound. Was seen in clinic and due to extensive drainage and history of brain abscess, was admitted for observation and IV abx. Feels well currently. No headache, dizziness, motor/sensory deficits, changes in speech.  Patient Active Problem List   Diagnosis Date Noted  . Surgical site infection 03/17/2017  . GERD (gastroesophageal reflux disease) 12/24/2016  . Streptococcus infection   . Fusobacterium infection   . Hyperglycemia 10/27/2016  . Hx of breast cancer 10/23/2016  . Essential hypertension 10/23/2016  . Hypokalemia 10/23/2016  . Brain abscess 10/22/2016    PMH: Past Medical History:  Diagnosis Date  . Cancer [facility]) 1996   breast cx  . Hypertension     PSH: Past Surgical History:  Procedure Laterality Date  . ABDOMINAL HYSTERECTOMY    . APPLICATION OF CRANIAL NAVIGATION N/A 10/27/2016   Procedure: APPLICATION OF CRANIAL NAVIGATION;  Surgeon: Erline Levine, MD;  Location: [place];  Service: Neurosurgery;  Laterality: N/A;  . CRANIOTOMY Left 10/27/2016   Procedure: CRANIOTOMY TUMOR EXCISION;  Surgeon: Erline Levine, MD;  Location: [place];  Service: Neurosurgery;  Laterality: Left;  . IR GENERIC HISTORICAL  11/02/2016   IR US GUIDE VASC ACCESS RIGHT 11/02/2016 Arne Cleveland, MD [facility]INTER[facility]  . IR GENERIC HISTORICAL  11/02/2016   IR FLUORO GUIDE CV LINE RIGHT  11/02/2016 Arne Cleveland, MD [facility]  . IR REMOVAL TUN CV CATH W/O FL  12/10/2016  . MASTECTOMY     right breast    Prescriptions Prior to Admission  Medication Sig Dispense Refill Last Dose  . benazepril (LOTENSIN) 20 MG tablet Take 1 tablet (20 mg total) by mouth daily. 90 tablet 0 03/17/2017 at Unknown time  . metoprolol succinate (TOPROL XL) 50 MG 24 hr tablet Take 1 tablet (50 mg total) by mouth daily. Take with or immediately following a meal. (Patient taking differently: Take 50 mg by mouth at bedtime. Take with or immediately following a meal.) 30 tablet 4 Past Week at Unknown time  . pantoprazole (PROTONIX) 40 MG tablet Take 1 tablet (40 mg total) by mouth daily. 30 tablet 5 03/17/2017 at Unknown time  . raloxifene (EVISTA) 60 MG tablet Take 60 mg by mouth daily.   03/17/2017 at Unknown time  . saccharomyces boulardii (FLORASTOR) 250 MG capsule Take 250 mg by mouth 2 (two) times daily.   03/17/2017 at AM    SH: Social History  Substance Use Topics  . Smoking status: Never Smoker  . Smokeless tobacco: Never Used  . Alcohol use Yes     Comment: occasional    MEDS: Prior to Admission medications   Medication Sig Start Date End Date Taking? Authorizing Provider  benazepril (LOTENSIN) 20 MG tablet Take 1 tablet (20 mg total) by mouth daily. 12/18/16  Yes Meredith Staggers, MD  metoprolol succinate (TOPROL XL) 50 MG 24 hr tablet Take 1  tablet (50 mg total) by mouth daily. Take with or immediately following a meal. Patient taking differently: Take 50 mg by mouth at bedtime. Take with or immediately following a meal. 11/17/16  Yes Meredith Staggers, MD  pantoprazole (PROTONIX) 40 MG tablet Take 1 tablet (40 mg total) by mouth daily. 12/24/16  Yes Meredith Staggers, MD  raloxifene (EVISTA) 60 MG tablet Take 60 mg by mouth daily.   Yes [provider]  saccharomyces boulardii (FLORASTOR) 250 MG capsule Take 250 mg by mouth 2 (two) times daily.   Yes [provider]     ALLERGY: Allergies  Allergen Reactions  . Wasp Venom Anaphylaxis  . Sulfa Antibiotics Hives    Social History  Substance Use Topics  . Smoking status: Never Smoker  . Smokeless tobacco: Never Used  . Alcohol use Yes     Comment: occasional     Family History  Problem Relation Age of Onset  . Breast cancer Mother   . Colon cancer Father      ROS   Review of Systems  Constitutional: Positive for chills, diaphoresis and malaise/fatigue. Negative for fever.  HENT: Negative.   Eyes: Negative.   Respiratory: Negative.   Cardiovascular: Negative.   Gastrointestinal: Negative.   Genitourinary: Negative.   Musculoskeletal: Negative.   Neurological: Negative.   Psychiatric/Behavioral: Negative.     Exam   Vitals:   03/17/17 1105  BP: (!) 145/98  Pulse: (!) 115  Temp: 97.7 F (36.5 C)  SpO2: 97%   General appearance: WDWN, NAD, nontoxic Eyes: PERRL, Fundoscopic: normal Cardiovascular: Regular rate and rhythm without murmurs, rubs, gallops. No edema or variciosities. Distal pulses normal. Pulmonary: Clear to auscultation Musculoskeletal:     Muscle tone upper extremities: Normal    Muscle tone lower extremities: Normal    Motor exam: Upper Extremities Deltoid Bicep Tricep Grip  Right 5/5 5/5 5/5 5/5  Left 5/5 5/5 5/5 5/5   Lower Extremity IP Quad PF DF EHL  Right 5/5 5/5 5/5 5/5 5/5  Left 5/5 5/5 5/5 5/5 5/5   Neurological Awake, alert, oriented Memory and concentration grossly intact Speech fluent, appropriate CNII: Visual fields normal CNIII/IV/VI: EOMI CNV: Facial sensation normal CNVII: Symmetric, normal strength CNVIII: Grossly normal CNIX: Normal palate movement CNXI: Trap and SCM strength normal CN XII: Tongue protrusion normal Sensation grossly intact to LT DTR: Normal Coordination (finger/nose & heel/shin): Normal  Skin: round fluctuant mass left side frontal. Extensive amount of purulent discharge with light palpation. Surrounding  erythema and warmth extending just below left side hairline.   Results - Imaging/Labs   No results found for this or any previous visit (from the past 48 hour(s)).  No results found.  Impression/Plan   71 y.o. female with surgical site infection - concerned for deep infection vs superficial. Neurologically intact. No disorientation/aphasia liek she was having when she was first diagnosed with brain abscess. Admitted for for further evaluation. - Labs: CBC, CMP, blood cultures x2, ESR, CRP - Imaging: MRI brain without contrast - Antibiotics: Will contact ID for recs

## 2017-03-17 NOTE — Progress Notes (Signed)
[facility] for Infectious Disease    Date of Admission:  03/17/2017   Total days of antibiotics 0         Reason for Consult: Drainage from craniotomy site    Referring Provider: Dr. Cyndy Freeze    Assessment: 1. Infection of previous surgical site with potential infection of cranial plate/hardware. 2. S/P evacuation and antibiotic treatment for brain abscess (Strep intermedius, Fusobacterium nucleatum)   Plan: - BCx, CRP, Sed Rate, CBC and CMET have been ordered but not drawn yet  - Imaging with MRI or CT scan - whichever the neurosurgery team feels to be most helpful to determine level of involvement for procedure tomorrow  - Appreciate holding empiric antibiotics prior to surgery to assist with targeting organism  - Can D/C Contact precautions (no history of MRSA in past and negative PCR)   Janene Madeira, MSN, NP-C [facility]  03/17/2017  5:16 PM     Active Problems:   Surgical site infection   . benazepril  20 mg Oral Daily  . docusate sodium  100 mg Oral BID  . metoprolol succinate  50 mg Oral Daily  . pantoprazole  40 mg Oral Daily  . [START ON 03/18/2017] raloxifene  60 mg Oral Daily  . saccharomyces boulardii  250 mg Oral BID  . sodium chloride flush  3 mL Intravenous Q12H    HPI: Alexandria [name] is a 71 y.o. female that was readmitted to the hospital today after she was seen in the outpatient neurosurgery clinic for purulent drainage/swelling from previous craniotomy site and associated low grade fevers. She is s/p craniotomy and application of cranial plate 10/27/16 by Dr. Vertell Limber for brain abscess. Was last seen by Dr. Megan Salon on 03/03/17 for follow up of previous left frontal abscess s/p craniotomy and drainage (culture + strep intermedius and fusobacterium nucleatum; tissue + for strep intermedius). She completed 6 weeks of IV Ceftriaxone and Metronidazole on 12/08/16 for polymicrobial brain abscess;  MRI 02/05/17 without residual evidence or mass.   Friday she noticed a lump over her old incision and felt poorly overall. Saturday she was improved, however on Sunday she noticed increased size to lump and felt poor in general. Monday morning called the neurosurgeon's office and reported symptoms, also noted her blood pressure to be elevated compared to normal. Interval history since last office visit with ID she has not been ill and reports having had a root canal with abscess of a tooth ~July (early) of this month that was treated only with procedure (no antibiotics) and has since resolved. No further dental complaints.   Her daughter was present for the encounter to assist with history.    Review of Systems: Review of Systems  Constitutional: Positive for fever (lowgrade temp Sunday 99.9). Negative for chills and malaise/fatigue.  Eyes: Negative for blurred vision and double vision.  Respiratory: Negative for cough and shortness of breath.   Cardiovascular: Negative for chest pain.  Gastrointestinal: Negative for diarrhea, nausea and vomiting.  Genitourinary: Negative for dysuria.  Musculoskeletal: Negative for myalgias and neck pain.  Skin: Negative for rash.  Neurological: Negative for dizziness, speech change, seizures, loss of consciousness and headaches.    Past Medical History:  Diagnosis Date  . Cancer [facility]) 1996   breast cx  . Hypertension     Social History  Substance Use Topics  . Smoking status: Never Smoker  . Smokeless tobacco: Never Used  . Alcohol  use Yes     Comment: occasional    Family History  Problem Relation Age of Onset  . Breast cancer Mother   . Colon cancer Father    Allergies  Allergen Reactions  . Wasp Venom Anaphylaxis  . Sulfa Antibiotics Hives    OBJECTIVE: Blood pressure (!) 145/98, pulse (!) 115, temperature 97.7 F (36.5 C), temperature source Oral, height 5' 1.5" (1.562 m), SpO2 97 %.  Physical Exam  Constitutional: She is oriented  to person, place, and time and well-developed, well-nourished, and in no distress.  HENT:  Head:    Mouth/Throat: Oropharynx is clear and moist. Normal dentition. No dental abscesses.  Eyes: Pupils are equal, round, and reactive to light.  Cardiovascular: Normal rate and regular rhythm.   Pulmonary/Chest: Effort normal and breath sounds normal.  Abdominal: Soft. Bowel sounds are normal.  Musculoskeletal: Normal range of motion.  Neurological: She is alert and oriented to person, place, and time. She has normal motor skills, normal sensation, normal strength and intact cranial nerves. She displays facial symmetry. No cranial nerve deficit. GCS score is 15.  Skin: Skin is warm and dry.   Lab Results Pending   Microbiology: No results found for this or any previous visit (from the past 240 hour(s)).

## 2017-03-17 NOTE — Progress Notes (Signed)
Patient left unit via bed to MRI at this time. PRN Ativan given as ordered.

## 2017-03-18 ENCOUNTER — Observation Stay (HOSPITAL_COMMUNITY): Payer: Medicare Other | Admitting: Anesthesiology

## 2017-03-18 ENCOUNTER — Encounter (HOSPITAL_COMMUNITY): Payer: Self-pay | Admitting: *Deleted

## 2017-03-18 ENCOUNTER — Encounter (HOSPITAL_COMMUNITY)
Admission: AD | Disposition: A | Discharge status: Other Acute Inpatient Hospital | Payer: Self-pay | Source: Ambulatory Visit | Attending: Neurological Surgery

## 2017-03-18 DIAGNOSIS — Z86718 Personal history of other venous thrombosis and embolism: Secondary | ICD-10-CM | POA: Diagnosis not present

## 2017-03-18 DIAGNOSIS — B9689 Other specified bacterial agents as the cause of diseases classified elsewhere: Secondary | ICD-10-CM | POA: Diagnosis not present

## 2017-03-18 DIAGNOSIS — T847XXA Infection and inflammatory reaction due to other internal orthopedic prosthetic devices, implants and grafts, initial encounter: Secondary | ICD-10-CM

## 2017-03-18 DIAGNOSIS — E876 Hypokalemia: Secondary | ICD-10-CM | POA: Diagnosis not present

## 2017-03-18 DIAGNOSIS — L0291 Cutaneous abscess, unspecified: Secondary | ICD-10-CM

## 2017-03-18 DIAGNOSIS — K2970 Gastritis, unspecified, without bleeding: Secondary | ICD-10-CM | POA: Diagnosis not present

## 2017-03-18 DIAGNOSIS — Z882 Allergy status to sulfonamides status: Secondary | ICD-10-CM | POA: Diagnosis not present

## 2017-03-18 DIAGNOSIS — K5710 Diverticulosis of small intestine without perforation or abscess without bleeding: Secondary | ICD-10-CM | POA: Diagnosis not present

## 2017-03-18 DIAGNOSIS — I10 Essential (primary) hypertension: Secondary | ICD-10-CM | POA: Diagnosis not present

## 2017-03-18 DIAGNOSIS — R627 Adult failure to thrive: Secondary | ICD-10-CM | POA: Diagnosis not present

## 2017-03-18 DIAGNOSIS — Z967 Presence of other bone and tendon implants: Secondary | ICD-10-CM

## 2017-03-18 DIAGNOSIS — R6521 Severe sepsis with septic shock: Secondary | ICD-10-CM | POA: Diagnosis present

## 2017-03-18 DIAGNOSIS — Z881 Allergy status to other antibiotic agents status: Secondary | ICD-10-CM

## 2017-03-18 DIAGNOSIS — J90 Pleural effusion, not elsewhere classified: Secondary | ICD-10-CM | POA: Diagnosis present

## 2017-03-18 DIAGNOSIS — F502 Bulimia nervosa: Secondary | ICD-10-CM | POA: Diagnosis not present

## 2017-03-18 DIAGNOSIS — M868X8 Other osteomyelitis, other site: Secondary | ICD-10-CM | POA: Diagnosis not present

## 2017-03-18 DIAGNOSIS — A419 Sepsis, unspecified organism: Secondary | ICD-10-CM | POA: Diagnosis not present

## 2017-03-18 DIAGNOSIS — J9600 Acute respiratory failure, unspecified whether with hypoxia or hypercapnia: Secondary | ICD-10-CM | POA: Diagnosis not present

## 2017-03-18 DIAGNOSIS — K449 Diaphragmatic hernia without obstruction or gangrene: Secondary | ICD-10-CM | POA: Diagnosis not present

## 2017-03-18 DIAGNOSIS — T17918A Gastric contents in respiratory tract, part unspecified causing other injury, initial encounter: Secondary | ICD-10-CM | POA: Diagnosis not present

## 2017-03-18 DIAGNOSIS — Y848 Other medical procedures as the cause of abnormal reaction of the patient, or of later complication, without mention of misadventure at the time of the procedure: Secondary | ICD-10-CM | POA: Diagnosis not present

## 2017-03-18 DIAGNOSIS — Y838 Other surgical procedures as the cause of abnormal reaction of the patient, or of later complication, without mention of misadventure at the time of the procedure: Secondary | ICD-10-CM | POA: Diagnosis not present

## 2017-03-18 DIAGNOSIS — J9601 Acute respiratory failure with hypoxia: Secondary | ICD-10-CM | POA: Diagnosis not present

## 2017-03-18 DIAGNOSIS — J95851 Ventilator associated pneumonia: Secondary | ICD-10-CM | POA: Diagnosis not present

## 2017-03-18 DIAGNOSIS — K219 Gastro-esophageal reflux disease without esophagitis: Secondary | ICD-10-CM | POA: Diagnosis not present

## 2017-03-18 DIAGNOSIS — L02811 Cutaneous abscess of head [any part, except face]: Secondary | ICD-10-CM | POA: Diagnosis not present

## 2017-03-18 DIAGNOSIS — R0902 Hypoxemia: Secondary | ICD-10-CM | POA: Diagnosis not present

## 2017-03-18 DIAGNOSIS — T86832 Bone graft infection: Secondary | ICD-10-CM | POA: Diagnosis present

## 2017-03-18 DIAGNOSIS — J690 Pneumonitis due to inhalation of food and vomit: Secondary | ICD-10-CM | POA: Diagnosis not present

## 2017-03-18 DIAGNOSIS — G8918 Other acute postprocedural pain: Secondary | ICD-10-CM | POA: Diagnosis not present

## 2017-03-18 DIAGNOSIS — T847XXD Infection and inflammatory reaction due to other internal orthopedic prosthetic devices, implants and grafts, subsequent encounter: Secondary | ICD-10-CM | POA: Diagnosis not present

## 2017-03-18 DIAGNOSIS — K311 Adult hypertrophic pyloric stenosis: Secondary | ICD-10-CM | POA: Diagnosis present

## 2017-03-18 DIAGNOSIS — T814XXA Infection following a procedure, initial encounter: Secondary | ICD-10-CM | POA: Diagnosis present

## 2017-03-18 DIAGNOSIS — E43 Unspecified severe protein-calorie malnutrition: Secondary | ICD-10-CM | POA: Diagnosis not present

## 2017-03-18 DIAGNOSIS — T814XXD Infection following a procedure, subsequent encounter: Secondary | ICD-10-CM | POA: Diagnosis not present

## 2017-03-18 DIAGNOSIS — G060 Intracranial abscess and granuloma: Secondary | ICD-10-CM | POA: Diagnosis present

## 2017-03-18 HISTORY — PX: CRANIOTOMY: SHX93

## 2017-03-18 LAB — MRSA PCR SCREENING: MRSA BY PCR: NEGATIVE

## 2017-03-18 LAB — SURGICAL PCR SCREEN
MRSA, PCR: NEGATIVE
Staphylococcus aureus: NEGATIVE

## 2017-03-18 SURGERY — CRANIOTOMY BONE FLAP/PROSTHETIC PLATE
Anesthesia: General | Site: Head | Laterality: Left

## 2017-03-18 MED ORDER — DEXAMETHASONE SODIUM PHOSPHATE 10 MG/ML IJ SOLN
INTRAMUSCULAR | Status: DC | PRN
  Administered 2017-03-18: 10 mg via INTRAVENOUS

## 2017-03-18 MED ORDER — HYDROMORPHONE HCL 1 MG/ML IJ SOLN
INTRAMUSCULAR | Status: AC
  Administered 2017-03-19: 0.5 mg via INTRAVENOUS
  Filled 2017-03-18: qty 1

## 2017-03-18 MED ORDER — SODIUM CHLORIDE 0.9 % IV SOLN
INTRAVENOUS | Status: DC
  Administered 2017-03-18 – 2017-03-23 (×5): via INTRAVENOUS

## 2017-03-18 MED ORDER — PROPOFOL 10 MG/ML IV BOLUS
INTRAVENOUS | Status: DC | PRN
Start: 2017-03-18 — End: 2017-03-18
  Administered 2017-03-18: 110 mg via INTRAVENOUS
  Administered 2017-03-18: 30 mg via INTRAVENOUS

## 2017-03-18 MED ORDER — LABETALOL HCL 5 MG/ML IV SOLN
10.0000 mg | INTRAVENOUS | Status: DC | PRN
  Administered 2017-03-18 (×2): 10 mg via INTRAVENOUS
  Administered 2017-03-18 – 2017-03-19 (×2): 20 mg via INTRAVENOUS
  Administered 2017-03-19: 10 mg via INTRAVENOUS
  Administered 2017-03-19: 20 mg via INTRAVENOUS
  Administered 2017-03-20 (×2): 40 mg via INTRAVENOUS
  Administered 2017-03-20: 30 mg via INTRAVENOUS
  Filled 2017-03-18 (×2): qty 4
  Filled 2017-03-18: qty 8
  Filled 2017-03-18: qty 4
  Filled 2017-03-18: qty 8

## 2017-03-18 MED ORDER — ONDANSETRON HCL 4 MG/2ML IJ SOLN
INTRAMUSCULAR | Status: DC | PRN
  Administered 2017-03-18: 4 mg via INTRAVENOUS

## 2017-03-18 MED ORDER — MAGNESIUM CITRATE PO SOLN: 1.0000 | Freq: Once | ORAL | Status: DC | PRN

## 2017-03-18 MED ORDER — ONDANSETRON HCL 4 MG/2ML IJ SOLN
INTRAMUSCULAR | Status: AC
  Filled 2017-03-18: qty 2

## 2017-03-18 MED ORDER — DOCUSATE SODIUM 100 MG PO CAPS: 100.0000 mg | ORAL_CAPSULE | Freq: Two times a day (BID) | ORAL | Status: DC

## 2017-03-18 MED ORDER — HYDROCODONE-ACETAMINOPHEN 5-325 MG PO TABS
ORAL_TABLET | ORAL | Status: AC
  Filled 2017-03-18: qty 1

## 2017-03-18 MED ORDER — NALOXONE HCL 0.4 MG/ML IJ SOLN: 0.0800 mg | INTRAMUSCULAR | Status: DC | PRN

## 2017-03-18 MED ORDER — LIDOCAINE 2% (20 MG/ML) 5 ML SYRINGE
INTRAMUSCULAR | Status: DC | PRN
  Administered 2017-03-18: 80 mg via INTRAVENOUS
  Administered 2017-03-18: 40 mg via INTRAVENOUS

## 2017-03-18 MED ORDER — ESMOLOL HCL 100 MG/10ML IV SOLN
INTRAVENOUS | Status: DC | PRN
  Administered 2017-03-18: 20 mg via INTRAVENOUS

## 2017-03-18 MED ORDER — THROMBIN 20000 UNITS EX SOLR
CUTANEOUS | Status: AC
  Filled 2017-03-18: qty 20000

## 2017-03-18 MED ORDER — HYDROCODONE-ACETAMINOPHEN 5-325 MG PO TABS
1.0000 | ORAL_TABLET | ORAL | Status: DC | PRN
  Administered 2017-03-19 (×2): 1 via ORAL
  Filled 2017-03-18 (×2): qty 1

## 2017-03-18 MED ORDER — BUPIVACAINE-EPINEPHRINE (PF) 0.5% -1:200000 IJ SOLN
INTRAMUSCULAR | Status: AC
  Filled 2017-03-18: qty 30

## 2017-03-18 MED ORDER — DEXAMETHASONE SODIUM PHOSPHATE 10 MG/ML IJ SOLN
INTRAMUSCULAR | Status: AC
  Filled 2017-03-18: qty 1

## 2017-03-18 MED ORDER — FENTANYL CITRATE (PF) 100 MCG/2ML IJ SOLN
INTRAMUSCULAR | Status: DC | PRN
  Administered 2017-03-18 (×3): 50 ug via INTRAVENOUS
  Administered 2017-03-18: 100 ug via INTRAVENOUS

## 2017-03-18 MED ORDER — SODIUM CHLORIDE 0.9 % IV SOLN
INTRAVENOUS | Status: DC | PRN
  Administered 2017-03-18: 15:00:00 via INTRAVENOUS

## 2017-03-18 MED ORDER — LACTATED RINGERS IV SOLN: INTRAVENOUS | Status: DC

## 2017-03-18 MED ORDER — VANCOMYCIN HCL 1000 MG IV SOLR
INTRAVENOUS | Status: AC
  Filled 2017-03-18: qty 1000

## 2017-03-18 MED ORDER — THROMBIN 5000 UNITS EX SOLR
CUTANEOUS | Status: AC
  Filled 2017-03-18: qty 5000

## 2017-03-18 MED ORDER — SODIUM CHLORIDE 0.9 % IV SOLN
1.0000 g | INTRAVENOUS | Status: AC
  Administered 2017-03-18: 1 g via INTRAVENOUS
  Filled 2017-03-18 (×2): qty 1

## 2017-03-18 MED ORDER — LIDOCAINE 2% (20 MG/ML) 5 ML SYRINGE
INTRAMUSCULAR | Status: AC
  Filled 2017-03-18: qty 5

## 2017-03-18 MED ORDER — BISACODYL 10 MG RE SUPP: 10.0000 mg | Freq: Every day | RECTAL | Status: DC | PRN

## 2017-03-18 MED ORDER — POLYETHYLENE GLYCOL 3350 17 G PO PACK: 17.0000 g | PACK | Freq: Every day | ORAL | Status: DC | PRN

## 2017-03-18 MED ORDER — HEMOSTATIC AGENTS (NO CHARGE) OPTIME
TOPICAL | Status: DC | PRN
  Administered 2017-03-18: 1 via TOPICAL

## 2017-03-18 MED ORDER — MEPERIDINE HCL 25 MG/ML IJ SOLN: 6.2500 mg | INTRAMUSCULAR | Status: DC | PRN

## 2017-03-18 MED ORDER — METOCLOPRAMIDE HCL 5 MG/ML IJ SOLN: 10.0000 mg | Freq: Once | INTRAMUSCULAR | Status: DC | PRN

## 2017-03-18 MED ORDER — SUGAMMADEX SODIUM 200 MG/2ML IV SOLN
INTRAVENOUS | Status: AC
  Filled 2017-03-18: qty 2

## 2017-03-18 MED ORDER — LIDOCAINE-EPINEPHRINE 2 %-1:100000 IJ SOLN
INTRAMUSCULAR | Status: AC
  Filled 2017-03-18: qty 1

## 2017-03-18 MED ORDER — DEXTROSE 5 % IV SOLN
2.0000 g | INTRAVENOUS | Status: DC
  Filled 2017-03-18: qty 2

## 2017-03-18 MED ORDER — VANCOMYCIN HCL 1000 MG IV SOLR
INTRAVENOUS | Status: DC | PRN
  Administered 2017-03-18: 1000 mg via INTRAVENOUS

## 2017-03-18 MED ORDER — LABETALOL HCL 5 MG/ML IV SOLN
INTRAVENOUS | Status: AC
  Filled 2017-03-18: qty 4

## 2017-03-18 MED ORDER — MIDAZOLAM HCL 2 MG/2ML IJ SOLN
INTRAMUSCULAR | Status: AC
Start: 2017-03-18 — End: ?
  Filled 2017-03-18: qty 2

## 2017-03-18 MED ORDER — ACETAMINOPHEN 650 MG RE SUPP
650.0000 mg | RECTAL | Status: DC | PRN
  Administered 2017-03-23: 650 mg via RECTAL
  Filled 2017-03-18: qty 1

## 2017-03-18 MED ORDER — THROMBIN 20000 UNITS EX SOLR
CUTANEOUS | Status: DC | PRN
  Administered 2017-03-18: 16:00:00 via TOPICAL

## 2017-03-18 MED ORDER — ROCURONIUM BROMIDE 10 MG/ML (PF) SYRINGE
PREFILLED_SYRINGE | INTRAVENOUS | Status: AC
  Filled 2017-03-18: qty 5

## 2017-03-18 MED ORDER — 0.9 % SODIUM CHLORIDE (POUR BTL) OPTIME
TOPICAL | Status: DC | PRN
  Administered 2017-03-18 (×2): 1000 mL

## 2017-03-18 MED ORDER — ONDANSETRON HCL 4 MG/2ML IJ SOLN
4.0000 mg | INTRAMUSCULAR | Status: DC | PRN
  Administered 2017-03-19 – 2017-03-23 (×9): 4 mg via INTRAVENOUS
  Filled 2017-03-18 (×9): qty 2

## 2017-03-18 MED ORDER — VANCOMYCIN HCL 1000 MG IV SOLR
INTRAVENOUS | Status: DC | PRN
  Administered 2017-03-18: 1000 mg via TOPICAL

## 2017-03-18 MED ORDER — PANTOPRAZOLE SODIUM 40 MG IV SOLR: 40.0000 mg | Freq: Every day | INTRAVENOUS | Status: DC

## 2017-03-18 MED ORDER — HYDROMORPHONE HCL 1 MG/ML IJ SOLN
0.5000 mg | INTRAMUSCULAR | Status: DC | PRN
  Administered 2017-03-18: 1 mg via INTRAVENOUS
  Administered 2017-03-18 (×2): 0.5 mg via INTRAVENOUS
  Administered 2017-03-19: 1 mg via INTRAVENOUS
  Administered 2017-03-19 (×2): 0.5 mg via INTRAVENOUS
  Administered 2017-03-19 – 2017-03-21 (×8): 1 mg via INTRAVENOUS
  Administered 2017-03-22 (×3): 0.5 mg via INTRAVENOUS
  Administered 2017-03-23 (×2): 1 mg via INTRAVENOUS
  Administered 2017-03-23 (×2): 0.5 mg via INTRAVENOUS
  Filled 2017-03-18 (×2): qty 1
  Filled 2017-03-18: qty 0.5
  Filled 2017-03-18 (×4): qty 1
  Filled 2017-03-18 (×3): qty 0.5
  Filled 2017-03-18 (×8): qty 1
  Filled 2017-03-18 (×2): qty 0.5

## 2017-03-18 MED ORDER — FENTANYL CITRATE (PF) 100 MCG/2ML IJ SOLN: 25.0000 ug | INTRAMUSCULAR | Status: DC | PRN

## 2017-03-18 MED ORDER — PROMETHAZINE HCL 12.5 MG PO TABS
12.5000 mg | ORAL_TABLET | ORAL | Status: DC | PRN
  Administered 2017-03-19: 25 mg via ORAL
  Filled 2017-03-18: qty 1
  Filled 2017-03-18 (×2): qty 2
  Filled 2017-03-18: qty 1

## 2017-03-18 MED ORDER — ROCURONIUM BROMIDE 100 MG/10ML IV SOLN
INTRAVENOUS | Status: DC | PRN
  Administered 2017-03-18: 10 mg via INTRAVENOUS
  Administered 2017-03-18: 20 mg via INTRAVENOUS
  Administered 2017-03-18: 40 mg via INTRAVENOUS

## 2017-03-18 MED ORDER — THROMBIN 5000 UNITS EX SOLR
OROMUCOSAL | Status: DC | PRN
  Administered 2017-03-18: 16:00:00 via TOPICAL

## 2017-03-18 MED ORDER — SODIUM CHLORIDE 0.9 % IR SOLN
Status: DC | PRN
  Administered 2017-03-18: 16:00:00

## 2017-03-18 MED ORDER — ONDANSETRON HCL 4 MG PO TABS: 4.0000 mg | ORAL_TABLET | ORAL | Status: DC | PRN

## 2017-03-18 MED ORDER — ACETAMINOPHEN 325 MG PO TABS: 650.0000 mg | ORAL_TABLET | ORAL | Status: DC | PRN

## 2017-03-18 MED ORDER — VANCOMYCIN HCL IN DEXTROSE 1-5 GM/200ML-% IV SOLN
INTRAVENOUS | Status: AC
  Filled 2017-03-18: qty 200

## 2017-03-18 MED ORDER — MIDAZOLAM HCL 2 MG/2ML IJ SOLN
INTRAMUSCULAR | Status: DC | PRN
  Administered 2017-03-18: 2 mg via INTRAVENOUS

## 2017-03-18 MED ORDER — SUGAMMADEX SODIUM 200 MG/2ML IV SOLN
INTRAVENOUS | Status: DC | PRN
  Administered 2017-03-18: 140 mg via INTRAVENOUS

## 2017-03-18 MED ORDER — FENTANYL CITRATE (PF) 250 MCG/5ML IJ SOLN
INTRAMUSCULAR | Status: AC
  Filled 2017-03-18: qty 5

## 2017-03-18 SURGICAL SUPPLY — 64 items
BLADE CLIPPER SURG (BLADE) ×6 IMPLANT
BLADE ULTRA TIP 2M (BLADE) ×3 IMPLANT
BUR ACORN 6.0 PRECISION (BURR) IMPLANT
BUR ACORN 6.0MM PRECISION (BURR)
BUR MATCHSTICK NEURO 3.0 LAGG (BURR) IMPLANT
BUR SPIRAL ROUTER 2.3 (BUR) IMPLANT
BUR SPIRAL ROUTER 2.3MM (BUR)
CANISTER SUCT 3000ML PPV (MISCELLANEOUS) ×6 IMPLANT
CHLORAPREP W/TINT 26ML (MISCELLANEOUS) ×3 IMPLANT
DRAPE NEUROLOGICAL W/INCISE (DRAPES) ×3 IMPLANT
DRAPE SHEET LG 3/4 BI-LAMINATE (DRAPES) ×6 IMPLANT
DRAPE SURG 17X23 STRL (DRAPES) ×3 IMPLANT
DRAPE WARM FLUID 44X44 (DRAPE) ×3 IMPLANT
DRSG OPSITE POSTOP 4X6 (GAUZE/BANDAGES/DRESSINGS) ×3 IMPLANT
ELECT REM PT RETURN 9FT ADLT (ELECTROSURGICAL) ×3
ELECTRODE REM PT RTRN 9FT ADLT (ELECTROSURGICAL) ×1 IMPLANT
EVACUATOR SILICONE 100CC (DRAIN) ×3 IMPLANT
GAUZE SPONGE 4X4 16PLY XRAY LF (GAUZE/BANDAGES/DRESSINGS) IMPLANT
GLOVE BIO SURGEON STRL SZ7 (GLOVE) ×6 IMPLANT
GLOVE BIOGEL PI IND STRL 7.0 (GLOVE) ×1 IMPLANT
GLOVE BIOGEL PI IND STRL 7.5 (GLOVE) ×2 IMPLANT
GLOVE BIOGEL PI INDICATOR 7.0 (GLOVE) ×2
GLOVE BIOGEL PI INDICATOR 7.5 (GLOVE) ×4
GLOVE SS BIOGEL STRL SZ 7.5 (GLOVE) ×4 IMPLANT
GLOVE SUPERSENSE BIOGEL SZ 7.5 (GLOVE) ×8
GOWN STRL REUS W/ TWL LRG LVL3 (GOWN DISPOSABLE) ×2 IMPLANT
GOWN STRL REUS W/ TWL XL LVL3 (GOWN DISPOSABLE) IMPLANT
GOWN STRL REUS W/TWL 2XL LVL3 (GOWN DISPOSABLE) ×6 IMPLANT
GOWN STRL REUS W/TWL LRG LVL3 (GOWN DISPOSABLE) ×4
GOWN STRL REUS W/TWL XL LVL3 (GOWN DISPOSABLE)
HEMOSTAT POWDER KIT SURGIFOAM (HEMOSTASIS) ×3 IMPLANT
HEMOSTAT SURGICEL 2X14 (HEMOSTASIS) ×3 IMPLANT
KIT BASIN OR (CUSTOM PROCEDURE TRAY) ×3 IMPLANT
KIT ROOM TURNOVER OR (KITS) ×3 IMPLANT
NEEDLE HYPO 21X1.5 SAFETY (NEEDLE) ×3 IMPLANT
NEEDLE HYPO 25X1 1.5 SAFETY (NEEDLE) ×3 IMPLANT
NS IRRIG 1000ML POUR BTL (IV SOLUTION) ×6 IMPLANT
PACK CRANIOTOMY (CUSTOM PROCEDURE TRAY) ×3 IMPLANT
PATTIES SURGICAL .5 X.5 (GAUZE/BANDAGES/DRESSINGS) IMPLANT
PATTIES SURGICAL .5 X3 (DISPOSABLE) IMPLANT
PATTIES SURGICAL .5X1.5 (GAUZE/BANDAGES/DRESSINGS) IMPLANT
PATTIES SURGICAL 1X1 (DISPOSABLE) IMPLANT
PIN MAYFIELD SKULL DISP (PIN) ×3 IMPLANT
SPONGE LAP 18X18 X RAY DECT (DISPOSABLE) ×3 IMPLANT
SPONGE NEURO XRAY DETECT 1X3 (DISPOSABLE) IMPLANT
SPONGE SURGIFOAM ABS GEL 100 (HEMOSTASIS) ×3 IMPLANT
SPONGE SURGIFOAM ABS GEL 100C (HEMOSTASIS) ×3 IMPLANT
STAPLER VISISTAT 35W (STAPLE) ×3 IMPLANT
STOCKINETTE 6  STRL (DRAPES) ×2
STOCKINETTE 6 STRL (DRAPES) ×1 IMPLANT
SUT ETHILON 3 0 FSL (SUTURE) ×3 IMPLANT
SUT NURALON 4 0 TR CR/8 (SUTURE) ×3 IMPLANT
SUT PROLENE 0 CT 1 30 (SUTURE) ×21 IMPLANT
SUT VIC AB 0 CT1 18XCR BRD8 (SUTURE) ×1 IMPLANT
SUT VIC AB 0 CT1 8-18 (SUTURE) ×2
SUT VIC AB 2-0 CT1 18 (SUTURE) ×3 IMPLANT
SYR 30ML LL (SYRINGE) ×6 IMPLANT
TOWEL GREEN STERILE (TOWEL DISPOSABLE) ×3 IMPLANT
TOWEL GREEN STERILE FF (TOWEL DISPOSABLE) ×3 IMPLANT
TRAY FOLEY W/METER SILVER 16FR (SET/KITS/TRAYS/PACK) IMPLANT
TUBE CONNECTING 12'X1/4 (SUCTIONS) ×1
TUBE CONNECTING 12X1/4 (SUCTIONS) ×2 IMPLANT
UNDERPAD 30X30 (UNDERPADS AND DIAPERS) ×3 IMPLANT
WATER STERILE IRR 1000ML POUR (IV SOLUTION) ×3 IMPLANT

## 2017-03-18 NOTE — Brief Op Note (Signed)
03/17/2017 - 03/18/2017  3:47 PM  PATIENT:  Lebron Quam Zukowski  71 y.o. female  PRE-OPERATIVE DIAGNOSIS:  Wound Drainage  POST-OPERATIVE DIAGNOSIS:  Wound Drainage  PROCEDURE:  Procedure(s): Left Frontal Cranial Wound Exploration with removal of bone flap (Left)  SURGEON:  Surgeon(s) and Role:    * Ditty, Kevan Ny, MD - Primary  PHYSICIAN ASSISTANT: Ferne Reus, PA-C  ANESTHESIA:   general  EBL:  Total I/O In: 500 [I.V.:500] Out: 50 [Blood:50]  BLOOD ADMINISTERED:none  DRAINS: JP drain   LOCAL MEDICATIONS USED:  NONE  SPECIMEN:  Source of Specimen:  Subgalial fluid collection, phlegmon & bone flap  DISPOSITION OF SPECIMEN:  Gram stain, Anaerobic & Aerobic cultures  COUNTS:  YES  TOURNIQUET:  * No tourniquets in log *  DICTATION: .Note written in EPIC  PLAN OF CARE: Admit to inpatient   PATIENT DISPOSITION:  PACU - hemodynamically stable.   Delay start of Pharmacological VTE agent (>24hrs) due to surgical blood loss or risk of bleeding: yes

## 2017-03-18 NOTE — Anesthesia Procedure Notes (Signed)
Procedure Name: Intubation Date/Time: 03/18/2017 2:43 PM Performed by: Trixie Deis A Pre-anesthesia Checklist: Patient identified, Emergency Drugs available, Suction available and Patient being monitored Patient Re-evaluated:Patient Re-evaluated prior to induction Oxygen Delivery Method: Circle System Utilized Preoxygenation: Pre-oxygenation with 100% oxygen Induction Type: IV induction Ventilation: Mask ventilation without difficulty Laryngoscope Size: Mac and 3 Grade View: Grade I Tube type: Oral Tube size: 7.0 mm Number of attempts: 1 Airway Equipment and Method: Stylet and Oral airway Placement Confirmation: ETT inserted through vocal cords under direct vision,  positive ETCO2 and breath sounds checked- equal and bilateral Secured at: 21 cm Tube secured with: Tape Dental Injury: Teeth and Oropharynx as per pre-operative assessment

## 2017-03-18 NOTE — Anesthesia Preprocedure Evaluation (Signed)
Anesthesia Evaluation  Patient identified by MRN, date of birth, ID band Patient awake    Reviewed: Allergy & Precautions, NPO status , Patient's Chart, lab work & pertinent test results  History of Anesthesia Complications (+) PONV  Airway Mallampati: II  TM Distance: >3 FB Neck ROM: Full    Dental no notable dental hx. (+) Caps   Pulmonary neg pulmonary ROS,    Pulmonary exam normal breath sounds clear to auscultation       Cardiovascular hypertension, Pt. on medications negative cardio ROS Normal cardiovascular exam Rhythm:Regular Rate:Normal     Neuro/Psych negative neurological ROS  negative psych ROS   GI/Hepatic negative GI ROS, Neg liver ROS,   Endo/Other  negative endocrine ROS  Renal/GU negative Renal ROS  negative genitourinary   Musculoskeletal negative musculoskeletal ROS (+)   Abdominal   Peds negative pediatric ROS (+)  Hematology negative hematology ROS (+)   Anesthesia Other Findings   Reproductive/Obstetrics negative OB ROS                             Anesthesia Physical Anesthesia Plan  ASA: II  Anesthesia Plan: General   Post-op Pain Management:    Induction: Intravenous  PONV Risk Score and Plan: 4 or greater and Ondansetron, Dexamethasone, Midazolam and Scopolamine patch - Pre-op  Airway Management Planned: Oral ETT  Additional Equipment:   Intra-op Plan:   Post-operative Plan: Extubation in OR  Informed Consent: I have reviewed the patients History and Physical, chart, labs and discussed the procedure including the risks, benefits and alternatives for the proposed anesthesia with the patient or authorized representative who has indicated his/her understanding and acceptance.   Dental advisory given  Plan Discussed with: CRNA  Anesthesia Plan Comments:         Anesthesia Quick Evaluation

## 2017-03-18 NOTE — Progress Notes (Signed)
[facility]    New pt this admission for  [facility] though we have provided services for Ms. Iiams in the recent past. [facility] will be providing HHRN and Home Infusion services for pt at DC to home.  [facility] team will follow Ms. Allensworth to prepare for DC in order to support transition home.   If patient discharges after hours, please call [phone].   Larry Sierras 03/18/2017, 4:37 PM

## 2017-03-18 NOTE — Progress Notes (Signed)
Subjective: No new complaints   Antibiotics:  Anti-infectives    None      Medications: Scheduled Meds: . benazepril  20 mg Oral Daily  . docusate sodium  100 mg Oral BID  . metoprolol succinate  50 mg Oral Daily  . pantoprazole  40 mg Oral Daily  . raloxifene  60 mg Oral Daily  . saccharomyces boulardii  250 mg Oral BID  . sodium chloride flush  3 mL Intravenous Q12H   Continuous Infusions: . sodium chloride    . sodium chloride 10 mL/hr at 03/18/17 0836   PRN Meds:.sodium chloride, acetaminophen **OR** acetaminophen, bisacodyl, HYDROcodone-acetaminophen, ondansetron **OR** ondansetron (ZOFRAN) IV, senna-docusate, sodium chloride flush, sodium phosphate, zolpidem    Objective: Weight change:   Intake/Output Summary (Last 24 hours) at 03/18/17 0958 Last data filed at 03/17/17 1646  Gross per 24 hour  Intake             5.33 ml  Output                0 ml  Net             5.33 ml   Blood pressure (!) 150/83, pulse 85, temperature 97.9 F (36.6 C), temperature source Oral, resp. rate 20, height 5' 1.5" (1.562 m), weight 156 lb 3.2 oz (70.9 kg), SpO2 97 %. Temp:  [97.7 F (36.5 C)-98.5 F (36.9 C)] 97.9 F (36.6 C) (08/15 0852) Pulse Rate:  [85-119] 85 (08/15 0852) Resp:  [16-20] 20 (08/15 0852) BP: (123-150)/(77-98) 150/83 (08/15 0851) SpO2:  [97 %-100 %] 97 % (08/15 0852) Weight:  [156 lb 3.2 oz (70.9 kg)] 156 lb 3.2 oz (70.9 kg) (08/14 1105)  Physical Exam: General: Alert and awake, oriented x3, not in any acute distress. HEENT: anicteric sclera,  EOMI, still with wound on scalp CVS regular rate,  Chest: , no wheezing, rales or rhonchi Abdomen: soft , nondistended,Extremities: no  clubbing or edema noted bilaterally Skin: no rashes Neuro: nonfocal  CBC: CBC Latest Ref Rng & Units 03/17/2017 11/07/2016 11/06/2016  WBC 4.0 - 10.5 K/uL 8.2 7.3 7.7  Hemoglobin 12.0 - 15.0 g/dL 13.4 11.5(L) 12.5  Hematocrit 36.0 - 46.0 % 40.0 35.1(L) 36.9  Platelets  150 - 400 K/uL 175 127(L) 144(L)      BMET  Recent Labs  03/17/17 1453  NA 139  K 3.0*  CL 105  CO2 24  GLUCOSE 139*  BUN 8  CREATININE 0.80  CALCIUM 9.4     Liver Panel   Recent Labs  03/17/17 1453  PROT 7.0  ALBUMIN 3.7  AST 20  ALT 15  ALKPHOS 68  BILITOT 0.8       Sedimentation Rate  Recent Labs  03/17/17 1453  ESRSEDRATE 93*   C-Reactive Protein  Recent Labs  03/17/17 1453  CRP 15.3*    Micro Results: Recent Results (from the past 720 hour(s))  MRSA PCR Screening     Status: None   Collection Time: 03/17/17 10:22 PM  Result Value Ref Range Status   MRSA by PCR NEGATIVE NEGATIVE Final    Comment:        The GeneXpert MRSA Assay (FDA approved for NASAL specimens only), is one component of a comprehensive MRSA colonization surveillance program. It is not intended to diagnose MRSA infection nor to guide or monitor treatment for MRSA infections.     Studies/Results: Ct Head W & Wo Contrast  Result Date: 03/18/2017 CLINICAL  DATA:  Follow-up brain abscess. Status post craniotomy October 27, 2016. EXAM: CT HEAD WITHOUT AND WITH CONTRAST TECHNIQUE: Contiguous axial images were obtained from the base of the skull through the vertex without and with intravenous contrast CONTRAST:  74m ISOVUE-300 IOPAMIDOL (ISOVUE-300) INJECTION 61% COMPARISON:  MRI of the head March 17, 2017 at 1005 hours and February 04, 2007 FINDINGS: BRAIN: No intraparenchymal hemorrhage, mass effect nor midline shift. Small area LEFT frontal encephalomalacia at site of prior abscess, trace linear enhancement within the evacuation cavity, similar to prior MRI. The ventricles and sulci are normal for age. No acute large vascular territory infarcts. No abnormal extra-axial fluid collections. Basal cisterns are patent. VASCULAR: Mild calcific atherosclerosis of the carotid siphons. SKULL: No skull fracture. Status post LEFT frontal craniotomy. 0.7 x 2.6 x 5.7 cm (craniocaudad by  transverse by AP) LEFT frontoparietal subgaleal fluid collection with mild marginal enhancement and, subcutaneous gas. No radiopaque foreign bodies. SINUSES/ORBITS: The mastoid air-cells and included paranasal sinuses are well-aerated.The included ocular globes and orbital contents are non-suspicious. OTHER: None. IMPRESSION: 1. No acute intracranial process. Expected postoperative changes from LEFT frontal abscess evacuation. 2. LEFT frontal subgaleal fluid collection with marginal enhancement and gas concerning for abscess, less likely pseudomeningocele. Recommend sampling if, not recently performed. Electronically Signed   By: CElon AlasM.D.   On: 03/18/2017 00:46   Mr Alexandria CosWIWContrast  Result Date: 03/17/2017 CLINICAL DATA:  Status post drainage of left frontal brain abscess. Low grade fever and purulent wound discharge. EXAM: MRI HEAD WITHOUT AND WITH CONTRAST TECHNIQUE: Multiplanar, multiecho pulse sequences of the brain and surrounding structures were obtained without and with intravenous contrast. CONTRAST:  116mMULTIHANCE GADOBENATE DIMEGLUMINE 529 MG/ML IV SOLN COMPARISON:  Brain MRI 02/03/2017 FINDINGS: Brain: The midline structures are normal. There is no focal diffusion restriction to indicate acute infarct. The cavity in the left frontal lobe has continued to contract. The degree of hyperintense T2 weighted signal surrounding the cavity is unchanged. Unchanged area of contrast enhancement within the central cavity. No other contrast enhancement of the brain. The rest of the brain parenchyma is of normal signal. No intraparenchymal hematoma or chronic microhemorrhage. Brain volume is normal for age without age-advanced or lobar predominant atrophy. Vascular: Major intracranial arterial and venous sinus flow voids are preserved. Skull and upper cervical spine: Remote left frontal craniotomy. There is a small fluid collection underlying the scalp wound that measures 2.3 x 0.7 cm, new from  the prior study. There is no contrast enhancing rim. Sinuses/Orbits: No fluid levels or advanced mucosal thickening. No mastoid or middle ear effusion. Normal orbits. IMPRESSION: 1. Continued contraction of the left frontal lobe cavity at the site of abscess drainage, with unchanged smooth central contrast-enhancement. No evidence of current cerebritis or recurrent abscess. 2. New small fluid collection underlying the left frontal scalp wound. No discrete, contrast-enhancing rim or other evidence of abscess; however, infection would be difficult to exclude. Electronically Signed   By: KeUlyses Jarred.D.   On: 03/17/2017 18:07      Assessment/Plan:  INTERVAL HISTORY: MRI last night reviewed by Dr DiCyndy Freezend there is subgaleal abscess in contact with cranotomy flap which he believes is likely infected   Active Problems:   Surgical site infection    KrJOLEIGH MINEAUs a [age].o.  known to me from prior admission in March which she had craniotomy by Dr. StVertell Limberor brain abscess. Cultures grew strep intermedius and fusobacterium and she completed 6 weeks  of IV CTX and flagyl. MRI done in mid July showed no residual abscess. She now has been admitted with emergence of subgaleal abscess in contact with craniotomy flap. ESR is quite high. Fortunately no DEEP brain abscess but concern that flap and hardware are infected   --continue to HOLD abx  --send DEEP TISSUE, BONE, PUS to MICRO lab for CULTURE rather than swabs (some surgeons at [facility] are more likely to do latter which is much lower yield)  Given her hx of allergic reaction while on the ceftriaxone, flagyl and keppra   I would give her MERREM perioperatively  I would favor 6 weeks of parenteral antibiotics if bone, hardware involved and consider oral antibiotics for protracted time after that  She had DVT associated with PICC but without central line  Hx of allergy: see above this was diagnosed by Dermatologist. Rash persisted for WEEKS  after stopping abx and coming off keppra. She did complete 2-3 days of abx after it started. I suspect this was to the beta lactam  Hx of DVT: would place tunnelled central line to avoid another DVT. Also could consider prophylactic anticoagulation  Dr. Megan Salon will take over the service tomorrow.  I spent greater than 35 minutes with the patient including greater than 50% of time in face to face counsel of the patient re her abscess, likely infected flap, allergic rxn, DVT and  in coordination of her care.    LOS: 1 day   Alcide Evener 03/18/2017, 9:58 AM

## 2017-03-18 NOTE — Transfer of Care (Signed)
Immediate Anesthesia Transfer of Care Note  Patient: Alexandria [name]  Procedure(s) Performed: Procedure(s): Left Frontal Cranial Wound Exploration with removal of bone flap (Left)  Patient Location: PACU  Anesthesia Type:General  Level of Consciousness: awake, alert  and oriented  Airway & Oxygen Therapy: Patient Spontanous Breathing and Patient connected to nasal cannula oxygen  Post-op Assessment: Report given to RN, Post -op Vital signs reviewed and stable and Patient moving all extremities  Post vital signs: Reviewed and stable  Last Vitals:  Vitals:   03/18/17 1245 03/18/17 1600  BP: (!) 147/97   Pulse:    Resp:    Temp:  (!) (P) 36.3 C  SpO2:      Last Pain:  Vitals:   03/18/17 1243  TempSrc: Oral  PainSc:          Complications: No apparent anesthesia complications

## 2017-03-18 NOTE — Progress Notes (Signed)
Consent for surgery signed. We, again, have discussed the risks, benefits, and alternatives to surgery and she wishes to proceed.

## 2017-03-18 NOTE — Progress Notes (Signed)
Imaging reviewed.  Subgaleal abscess in contact with craniotomy flap. No evidence of brain abscess or cerebritis. I have recommended surgery to explore the wound and remove the infected craniotomy flap. We have discussed the risks, benefits, and alternatives to surgery and she wishes to proceed.

## 2017-03-19 ENCOUNTER — Inpatient Hospital Stay (HOSPITAL_COMMUNITY): Payer: Medicare Other

## 2017-03-19 ENCOUNTER — Encounter (HOSPITAL_COMMUNITY): Payer: Self-pay | Admitting: Neurological Surgery

## 2017-03-19 DIAGNOSIS — Y838 Other surgical procedures as the cause of abnormal reaction of the patient, or of later complication, without mention of misadventure at the time of the procedure: Secondary | ICD-10-CM

## 2017-03-19 DIAGNOSIS — L02811 Cutaneous abscess of head [any part, except face]: Secondary | ICD-10-CM

## 2017-03-19 HISTORY — PX: IR FLUORO GUIDE CV LINE RIGHT: IMG2283

## 2017-03-19 HISTORY — PX: IR US GUIDE VASC ACCESS RIGHT: IMG2390

## 2017-03-19 MED ORDER — FAMOTIDINE IN NACL 20-0.9 MG/50ML-% IV SOLN
20.0000 mg | Freq: Two times a day (BID) | INTRAVENOUS | Status: DC
  Administered 2017-03-19 – 2017-03-20 (×3): 20 mg via INTRAVENOUS
  Filled 2017-03-19 (×3): qty 50

## 2017-03-19 MED ORDER — VANCOMYCIN HCL IN DEXTROSE 750-5 MG/150ML-% IV SOLN
750.0000 mg | Freq: Two times a day (BID) | INTRAVENOUS | Status: DC
  Administered 2017-03-19 – 2017-03-20 (×2): 750 mg via INTRAVENOUS
  Filled 2017-03-19 (×3): qty 150

## 2017-03-19 MED ORDER — BISMUTH SUBSALICYLATE 262 MG PO CHEW
524.0000 mg | CHEWABLE_TABLET | ORAL | Status: DC | PRN
  Administered 2017-03-19: 524 mg via ORAL
  Filled 2017-03-19 (×3): qty 2

## 2017-03-19 MED ORDER — SODIUM CHLORIDE 0.9 % IV SOLN
2.0000 g | Freq: Three times a day (TID) | INTRAVENOUS | Status: DC
  Administered 2017-03-19 – 2017-03-27 (×25): 2 g via INTRAVENOUS
  Filled 2017-03-19 (×28): qty 2

## 2017-03-19 MED ORDER — CLONIDINE HCL 0.2 MG/24HR TD PTWK
0.2000 mg | MEDICATED_PATCH | TRANSDERMAL | Status: DC
  Administered 2017-03-19: 0.2 mg via TRANSDERMAL
  Filled 2017-03-19: qty 1

## 2017-03-19 MED ORDER — LIDOCAINE HCL (PF) 1 % IJ SOLN
INTRAMUSCULAR | Status: DC | PRN
  Administered 2017-03-19: 5 mL

## 2017-03-19 MED ORDER — OXYCODONE-ACETAMINOPHEN 7.5-325 MG PO TABS
1.0000 | ORAL_TABLET | ORAL | Status: DC | PRN
Start: 2017-03-19 — End: 2017-03-20
  Administered 2017-03-19 (×2): 1 via ORAL
  Filled 2017-03-19 (×2): qty 1

## 2017-03-19 MED ORDER — HEPARIN SOD (PORK) LOCK FLUSH 100 UNIT/ML IV SOLN
INTRAVENOUS | Status: AC
  Filled 2017-03-19: qty 5

## 2017-03-19 MED ORDER — LIDOCAINE HCL (PF) 1 % IJ SOLN
INTRAMUSCULAR | Status: AC
  Filled 2017-03-19: qty 30

## 2017-03-19 MED ORDER — HYDROMORPHONE HCL 1 MG/ML IJ SOLN
INTRAMUSCULAR | Status: AC
  Filled 2017-03-19: qty 0.5

## 2017-03-19 NOTE — Progress Notes (Signed)
No acute events Some incisional pain Awake and alert Moving arms and legs well D/c drain this afternoon Transfer to 4NP

## 2017-03-19 NOTE — Care Management Note (Signed)
Case Management Note  Patient Details  Name: Alexandria [name] MRN: [id number] Date of Birth: 1945/08/21  Subjective/Objective:   Pt admitted on 03/17/17 with surgical site infection s/p craniotomy on 10/27/16. PTA, pt independent, lives at home alone.  She has supportive son and daughter in law at bedside.  Pt with hx of brain abscess with long term IV antibiotics provided by [place].                  Action/Plan: Met with pt and family to discuss dc planning.  Pt will require additional home IV antibiotics upon dc, and pt/family prefer to use [facility] again for [facility] needs.  Referral to [facility] IV infusion coordinator, Carolynn Sayers, to facilitate IV antibiotics at dc.  Will need orders for [facility] for home IV antibiotics and PICC line care.  Will follow progress.    Expected Discharge Date:   (Pending)               Expected Discharge Plan:  [place]  In-House Referral:     Discharge planning Services  CM Consult  Post Acute Care Choice:  Home Health, Resumption of Svcs/PTA Provider Choice offered to:  Patient  DME Arranged:  IV pump/equipment DME Agency:     HH Arranged:  RN [place] Agency:  [place]  Status of Service:  In process, will continue to follow  If discussed at Long Length of Stay Meetings, dates discussed:    Additional Comments:  Reinaldo Raddle, RN, BSN  Trauma/Neuro ICU Case Manager [phone]

## 2017-03-19 NOTE — Progress Notes (Signed)
Patient ID: Alexandria [name], female   DOB: 1945-12-12, 71 y.o.   MRN: [id number]          [facility] for Infectious Disease  Date of Admission:  03/17/2017           Day 1 vancomycin        Day 1 meropenem ASSESSMENT: She has new infection at the site of her previous craniotomy. I suspect that she has infection of the bone flap that was removed yesterday. I agree with continuing vancomycin and meropenem pending final cultures. She has had a previous right mastectomy and right axillary lymph node dissection. She developed a left arm DVT when she had a left arm PICC for her recent IV antibiotic therapy. I feel it is best to replace a tunneled IJ catheter and avoiding a PICC at this time. I will request central line replacement by interventional radiology.  PLAN: 1. Consult IR to place tunneled central IV catheter 2. 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 benazepril  20 mg Oral Daily  . docusate sodium  100 mg Oral BID  . metoprolol succinate  50 mg Oral Daily  . pantoprazole  40 mg Oral Daily  . raloxifene  60 mg Oral Daily  . saccharomyces boulardii  250 mg Oral BID  . sodium chloride flush  3 mL Intravenous Q12H    SUBJECTIVE: Ms. Situ completed 6 weeks of IV antibiotic therapy for a polymicrobial brain abscess on 12/08/2016. She was doing very well clinically and follow-up brain MRI showed resolution of the abscess. She recently developed swelling that her previous surgical site and then had spontaneous drainage of thick blood-tinged fluid leading to readmission. MRI showed a fluid collection under her bone flap. She underwent surgery with drainage of the abscess and removal of the bone flap yesterday. Gram stain of the subgaleal abscess shows gram-positive cocci in pairs. Cultures are  pending.  Review of Systems: Review of Systems  Constitutional: Negative for chills, diaphoresis and fever.  Gastrointestinal: Negative for abdominal pain, diarrhea, nausea and vomiting.  Neurological: Negative for sensory change, speech change, focal weakness and headaches.    Allergies  Allergen Reactions  . Wasp Venom Anaphylaxis  . Ceftriaxone Rash    Pt developed truncal rash on Ceftriaxone, flagyl and also possibly still on keppra. It persisted for weeks with scarring. She did complete 2-3 days of abx while rash still going on. Dermatologist diagnosed as drug rash  . Keppra [Levetiracetam] Rash    Possible drug rash see other notes  . Flagyl [Metronidazole] Rash    Possible drug rash see other notes  . Sulfa Antibiotics Hives    OBJECTIVE: Vitals:   03/19/17 0600 03/19/17 0700 03/19/17 0800 03/19/17 1100  BP: 121/67 127/63    Pulse: 67 85    Resp: 11 12    Temp:   98 F (36.7 C) 97.9 F (36.6 C)  TempSrc:   Axillary Oral  SpO2: 98% 99%    Weight:      Height:       Body mass index is 29 kg/m.  Physical Exam  Constitutional: She is oriented to person, place, and time.  She is joking and in good spirits as usual. Her family is present.  HENT:  There is a surgical bandage over the  left frontal region.  Neurological: She is alert and oriented to person, place, and time.  Skin: No rash noted.  Psychiatric: Mood and affect normal.    Lab Results Lab Results  Component Value Date   WBC 8.2 03/17/2017   HGB 13.4 03/17/2017   HCT 40.0 03/17/2017   MCV 85.3 03/17/2017   PLT 175 03/17/2017    Lab Results  Component Value Date   CREATININE 0.80 03/17/2017   BUN 8 03/17/2017   NA 139 03/17/2017   K 3.0 (L) 03/17/2017   CL 105 03/17/2017   CO2 24 03/17/2017    Lab Results  Component Value Date   ALT 15 03/17/2017   AST 20 03/17/2017   ALKPHOS 68 03/17/2017   BILITOT 0.8 03/17/2017     Microbiology: Recent Results (from the past 240 hour(s))    Culture, blood (routine x 2)     Status: None (Preliminary result)   Collection Time: 03/17/17  3:05 PM  Result Value Ref Range Status   Specimen Description BLOOD LEFT ANTECUBITAL  Final   Special Requests   Final    BOTTLES DRAWN AEROBIC ONLY Blood Culture adequate volume   Culture NO GROWTH 2 DAYS  Final   Report Status PENDING  Incomplete  Culture, blood (routine x 2)     Status: None (Preliminary result)   Collection Time: 03/17/17  3:11 PM  Result Value Ref Range Status   Specimen Description BLOOD LEFT ARM  Final   Special Requests   Final    BOTTLES DRAWN AEROBIC ONLY Blood Culture adequate volume   Culture NO GROWTH 2 DAYS  Final   Report Status PENDING  Incomplete  MRSA PCR Screening     Status: None   Collection Time: 03/17/17 10:22 PM  Result Value Ref Range Status   MRSA by PCR NEGATIVE NEGATIVE Final    Comment:        The GeneXpert MRSA Assay (FDA approved for NASAL specimens only), is one component of a comprehensive MRSA colonization surveillance program. It is not intended to diagnose MRSA infection nor to guide or monitor treatment for MRSA infections.   Surgical PCR screen     Status: None   Collection Time: 03/18/17 10:51 AM  Result Value Ref Range Status   MRSA, PCR NEGATIVE NEGATIVE Final   Staphylococcus aureus NEGATIVE NEGATIVE Final    Comment:        The Xpert SA Assay (FDA approved for NASAL specimens in patients over 68 years of age), is one component of a comprehensive surveillance program.  Test performance has been validated by [facility] for patients greater than or equal to 80 year old. It is not intended to diagnose infection nor to guide or monitor treatment.   Aerobic/Anaerobic Culture (surgical/deep wound)     Status: None (Preliminary result)   Collection Time: 03/18/17  3:04 PM  Result Value Ref Range Status   Specimen Description ABSCESS SUBGALEAL  Final   Special Requests NONE  Final   Gram Stain   Final    ABUNDANT  WBC PRESENT, PREDOMINANTLY PMN FEW GRAM POSITIVE COCCI IN PAIRS    Culture NO GROWTH < 24 HOURS  Final   Report Status PENDING  Incomplete  Aerobic/Anaerobic Culture (surgical/deep wound)     Status: None (Preliminary result)   Collection Time: 03/18/17  3:05 PM  Result Value Ref Range Status   Specimen Description ABSCESS  Final   Special Requests SUBGALEAL SPECIMEN B  Final   Gram  Stain   Final    FEW WBC PRESENT,BOTH PMN AND MONONUCLEAR RARE GRAM POSITIVE COCCI IN PAIRS    Culture NO GROWTH < 24 HOURS  Final   Report Status PENDING  Incomplete  Aerobic/Anaerobic Culture (surgical/deep wound)     Status: None (Preliminary result)   Collection Time: 03/18/17  3:06 PM  Result Value Ref Range Status   Specimen Description ABSCESS  Final   Special Requests SUBGALEAL PHLEGMON,SPECIMEN C  Final   Gram Stain   Final    FEW WBC PRESENT,BOTH PMN AND MONONUCLEAR NO ORGANISMS SEEN    Culture NO GROWTH < 24 HOURS  Final   Report Status PENDING  Incomplete  Aerobic/Anaerobic Culture (surgical/deep wound)     Status: None (Preliminary result)   Collection Time: 03/18/17  3:07 PM  Result Value Ref Range Status   Specimen Description BONE  Final   Special Requests BONE FLAP  Final   Gram Stain   Final    ABUNDANT WBC PRESENT,BOTH PMN AND MONONUCLEAR NO ORGANISMS SEEN    Culture NO GROWTH < 24 HOURS  Final   Report Status PENDING  Incomplete    Michel Bickers, MD [place] for Infect[facility] [phone] pager   [phone] cell 03/19/2017, 2:34 PM

## 2017-03-19 NOTE — Progress Notes (Signed)
Pharmacy Antibiotic Note  Alexandria [name] is a 71 y.o. female admitted on 03/17/2017  Wound infection at previous craniotomy site  Plan: Meropenem 2 g q8h Vancomycin 750 mg q12 Monitor renal fx, cx, vt prn  Height: 5' 1.5" (156.2 cm) Weight: 156 lb (70.8 kg) IBW/kg (Calculated) : 48.95  Temp (24hrs), Avg:97.7 F (36.5 C), Min:97.3 F (36.3 C), Max:98 F (36.7 C)   Recent Labs Lab 03/17/17 1453  WBC 8.2  CREATININE 0.80    Estimated Creatinine Clearance: 58.8 mL/min (by C-G formula based on SCr of 0.8 mg/dL).    Allergies  Allergen Reactions  . Wasp Venom Anaphylaxis  . Ceftriaxone Rash    Pt developed truncal rash on Ceftriaxone, flagyl and also possibly still on keppra. It persisted for weeks with scarring. She did complete 2-3 days of abx while rash still going on. Dermatologist diagnosed as drug rash  . Keppra [Levetiracetam] Rash    Possible drug rash see other notes  . Flagyl [Metronidazole] Rash    Possible drug rash see other notes  . Sulfa Antibiotics Hives   Levester Fresh, PharmD, BCPS, BCCCP Clinical Pharmacist Clinical phone for 03/19/2017 from 7a-3:30p: [phone] If after 3:30p, please call main pharmacy at: [phone] 03/19/2017 11:10 AM

## 2017-03-19 NOTE — Procedures (Signed)
Pre procedural Diagnosis: Poor venous access Post Procedural Diagnosis: Same  Successful placement of R IJ approach 21 cm dual lumen tunneled PICC line with tip at the superior caval-atrial junction.    EBL: None  No immediate post procedural complication.  The PICC line is ready for immediate use.  Alexandria Ryana Montecalvo, MD Pager #: [phone]   

## 2017-03-19 NOTE — Anesthesia Postprocedure Evaluation (Signed)
Anesthesia Post Note  Patient: Alexandria [name]  Procedure(s) Performed: Procedure(s) (LRB): Left Frontal Cranial Wound Exploration with removal of bone flap (Left)     Patient location during evaluation: PACU Anesthesia Type: General Level of consciousness: awake and patient cooperative Pain management: pain level controlled Vital Signs Assessment: post-procedure vital signs reviewed and stable Respiratory status: spontaneous breathing, nonlabored ventilation, respiratory function stable and patient connected to nasal cannula oxygen Cardiovascular status: blood pressure returned to baseline and stable Postop Assessment: no signs of nausea or vomiting Anesthetic complications: no    Last Vitals:  Vitals:   03/19/17 0800 03/19/17 1100  BP:    Pulse:    Resp:    Temp: 36.7 C 36.6 C  SpO2:      Last Pain:  Vitals:   03/19/17 1100  TempSrc: Oral  PainSc:                  Oswald Pott

## 2017-03-20 ENCOUNTER — Inpatient Hospital Stay (HOSPITAL_COMMUNITY): Payer: Medicare Other

## 2017-03-20 DIAGNOSIS — J690 Pneumonitis due to inhalation of food and vomit: Secondary | ICD-10-CM | POA: Diagnosis present

## 2017-03-20 LAB — BLOOD GAS, ARTERIAL
Acid-Base Excess: 4.3 mmol/L — ABNORMAL HIGH (ref 0.0–2.0)
Bicarbonate: 28.9 mmol/L — ABNORMAL HIGH (ref 20.0–28.0)
Drawn by: 51155
FIO2: 1
O2 Saturation: 95.4 %
Patient temperature: 97.5
pCO2 arterial: 47.2 mmHg (ref 32.0–48.0)
pH, Arterial: 7.401 (ref 7.350–7.450)
pO2, Arterial: 80.9 mmHg — ABNORMAL LOW (ref 83.0–108.0)

## 2017-03-20 LAB — CBC WITH DIFFERENTIAL/PLATELET
Basophils Absolute: 0 10*3/uL (ref 0.0–0.1)
Basophils Relative: 0 %
Eosinophils Absolute: 0 10*3/uL (ref 0.0–0.7)
Eosinophils Relative: 0 %
HCT: 38.9 % (ref 36.0–46.0)
Hemoglobin: 12.4 g/dL (ref 12.0–15.0)
Lymphocytes Relative: 12 %
Lymphs Abs: 1.2 10*3/uL (ref 0.7–4.0)
MCH: 28 pg (ref 26.0–34.0)
MCHC: 31.9 g/dL (ref 30.0–36.0)
MCV: 87.8 fL (ref 78.0–100.0)
Monocytes Absolute: 0.8 10*3/uL (ref 0.1–1.0)
Monocytes Relative: 8 %
Neutro Abs: 7.7 10*3/uL (ref 1.7–7.7)
Neutrophils Relative %: 80 %
Platelets: 225 10*3/uL (ref 150–400)
RBC: 4.43 MIL/uL (ref 3.87–5.11)
RDW: 13.8 % (ref 11.5–15.5)
WBC: 9.7 10*3/uL (ref 4.0–10.5)

## 2017-03-20 LAB — BASIC METABOLIC PANEL WITH GFR
Anion gap: 11 (ref 5–15)
BUN: 11 mg/dL (ref 6–20)
CO2: 29 mmol/L (ref 22–32)
Calcium: 9.6 mg/dL (ref 8.9–10.3)
Chloride: 103 mmol/L (ref 101–111)
Creatinine, Ser: 0.82 mg/dL (ref 0.44–1.00)
GFR calc Af Amer: >60 mL/min (ref >60)
GFR calc non Af Amer: >60 mL/min (ref >60)
Glucose, Bld: 159 mg/dL — ABNORMAL HIGH (ref 65–99)
Potassium: 3.8 mmol/L (ref 3.5–5.1)
Sodium: 143 mmol/L (ref 135–145)

## 2017-03-20 MED ORDER — DEXMEDETOMIDINE HCL IN NACL 400 MCG/100ML IV SOLN
0.0000 ug/kg/h | INTRAVENOUS | Status: AC
  Administered 2017-03-20: 0.3 ug/kg/h via INTRAVENOUS
  Administered 2017-03-21: 0.712 ug/kg/h via INTRAVENOUS
  Administered 2017-03-21: 0.7 ug/kg/h via INTRAVENOUS
  Administered 2017-03-22 – 2017-03-23 (×3): 0.9 ug/kg/h via INTRAVENOUS
  Filled 2017-03-20 (×7): qty 100

## 2017-03-20 MED ORDER — FENTANYL CITRATE (PF) 100 MCG/2ML IJ SOLN: 12.5000 ug | INTRAMUSCULAR | Status: DC | PRN

## 2017-03-20 MED ORDER — HEPARIN SODIUM (PORCINE) 5000 UNIT/ML IJ SOLN
5000.0000 [IU] | Freq: Three times a day (TID) | INTRAMUSCULAR | Status: DC
  Administered 2017-03-21 – 2017-03-27 (×17): 5000 [IU] via SUBCUTANEOUS
  Filled 2017-03-20 (×17): qty 1

## 2017-03-20 MED ORDER — SODIUM CHLORIDE 0.9 % IV SOLN: 250.0000 mL | INTRAVENOUS | Status: DC | PRN

## 2017-03-20 MED ORDER — SODIUM CHLORIDE 0.9 % IV SOLN
0.4000 ug/kg/h | INTRAVENOUS | Status: DC
  Filled 2017-03-20: qty 2

## 2017-03-20 MED ORDER — SODIUM CHLORIDE 0.9 % IV BOLUS (SEPSIS)
1000.0000 mL | Freq: Once | INTRAVENOUS | Status: AC
  Administered 2017-03-20: 1000 mL via INTRAVENOUS

## 2017-03-20 MED ORDER — ETOMIDATE 2 MG/ML IV SOLN
20.0000 mg | Freq: Once | INTRAVENOUS | Status: AC
  Administered 2017-03-20: 20 mg via INTRAVENOUS

## 2017-03-20 MED ORDER — NOREPINEPHRINE BITARTRATE 1 MG/ML IV SOLN
0.0000 ug/min | INTRAVENOUS | Status: DC
  Administered 2017-03-21: 3 ug/min via INTRAVENOUS
  Administered 2017-03-21: 2 ug/min via INTRAVENOUS
  Filled 2017-03-20 (×2): qty 4

## 2017-03-20 MED ORDER — SUCCINYLCHOLINE CHLORIDE 20 MG/ML IJ SOLN
50.0000 mg | Freq: Once | INTRAMUSCULAR | Status: AC
  Administered 2017-03-20: 50 mg via INTRAVENOUS
  Filled 2017-03-20: qty 2.5

## 2017-03-20 MED ORDER — METOCLOPRAMIDE HCL 5 MG/ML IJ SOLN
5.0000 mg | Freq: Four times a day (QID) | INTRAMUSCULAR | Status: DC
  Administered 2017-03-21 – 2017-03-23 (×10): 5 mg via INTRAVENOUS
  Filled 2017-03-20 (×10): qty 2

## 2017-03-20 NOTE — Procedures (Signed)
Intubation  Indication: acute hypoxic respiratory failure and respiratory distress  Family bedside informed and explained to the patient  Timeout done  Induction by etomidate and succinylcholine  Intubation by glidoscope size 7.5 ETT. 1st attempt  ETT positron confirmed by auscultation, CO2 detector and CXR  Patient tolerated the procedure well  Samul Dada, MD CCM attending

## 2017-03-20 NOTE — Progress Notes (Signed)
Neurosurgery Progress Note  Rough past 24 hours After eating tuna yesterday had acute onset diarrhea and vomiting Vomiting initially normal stomach contents. Given pepto and now dark in color. Mild LUQ pain. Diarrhea resolved. Head is doing fine. Minimal headache. PICC line placed yesterday by IR  EXAM:  BP (!) 146/103   Pulse 87   Temp (!) 97.5 F (36.4 C) (Oral)   Resp 17   Ht 5' 1.5" (1.562 m)   Wt 70.8 kg (156 lb)   SpO2 92%   BMI 29.00 kg/m   Awake, alert, oriented  Speech fluent, appropriate  CN grossly intact  Incision c/d/i  IMPRESSION/PLAN 71 y.o. female s/p craniectomy for infected bone flap.  Rough past 24 hours with nausea, vomiting, diarrhea (resvoled) Vomit is dark in color likely secondary to pepto. Will check ifob with BM to ensure no bleeding. Obtain Abd xray, CBC and BMP Likely just a GI illness Not ready to go home at this point. Still pending final cultures per ID

## 2017-03-20 NOTE — Progress Notes (Signed)
Patient ID: Alexandria [name], female   DOB: 12-25-1945, 71 y.o.   MRN: [id number]          [facility] for Infectious Disease  Date of Admission:  03/17/2017           Day 2 vancomycin        Day 2 meropenem ASSESSMENT: I do not know the cause of her recent nausea, vomiting or diarrhea. If her diarrhea recurs I would certainly check stool for C. difficile. She may have a postoperative ileus. I doubt that her GI symptoms are due to her subgaleal abscess or her current antibiotics. Her abscess cultures are growing a strep species. It may be the same strep intermedius that was grown from her previous brain abscess. I will continue meropenem but stop vancomycin now. I would avoid beta-lactam antibiotics because of the pruritic, red rash he developed when she was on ceftriaxone for her recent brain abscess.  PLAN: 1. Continue meropenem pending final cultures and consider converting to once daily IV ertapenem on discharge 2. Antiemetics and IV fluids for now  Active Problems:   Surgical site infection   Abscess   Infection of craniotomy plate (HCC)   Allergy to cephalosporin   History of DVT (deep vein thrombosis)   . benazepril  20 mg Oral Daily  . cloNIDine  0.2 mg Transdermal Q Thu-1800  . docusate sodium  100 mg Oral BID  . metoprolol succinate  50 mg Oral Daily  . pantoprazole  40 mg Oral Daily  . raloxifene  60 mg Oral Daily  . saccharomyces boulardii  250 mg Oral BID  . sodium chloride flush  3 mL Intravenous Q12H    SUBJECTIVE: Alexandria [name] had sudden onset of nausea, vomiting and diarrhea yesterday afternoon shortly after eating lunch. She had 3 episodes of loose stool very quickly but has not had any bowel movement since yesterday afternoon. She has continued to have nausea and vomiting, bringing up is in clear fluid with possible coffee ground material. She has not been able to eat or drink anything in the past 24 hours.  Review of Systems: Review of Systems    Constitutional: Negative for chills, diaphoresis and fever.  Gastrointestinal: Positive for abdominal pain, diarrhea, nausea and vomiting.  Neurological: Negative for sensory change, speech change, focal weakness and headaches.    Allergies  Allergen Reactions  . Wasp Venom Anaphylaxis  . Ceftriaxone Rash    Pt developed truncal rash on Ceftriaxone, flagyl and also possibly still on keppra. It persisted for weeks with scarring. She did complete 2-3 days of abx while rash still going on. Dermatologist diagnosed as drug rash  . Keppra [Levetiracetam] Rash    Possible drug rash see other notes  . Flagyl [Metronidazole] Rash    Possible drug rash see other notes  . Sulfa Antibiotics Hives    OBJECTIVE: Vitals:   03/20/17 1000 03/20/17 1100 03/20/17 1200 03/20/17 1300  BP: (!) 126/101 (!) 155/137 (!) 157/106 126/90  Pulse: 92 91 96 94  Resp: 12 (!) 21 (!) 21 19  Temp:      TempSrc:      SpO2: 91% 92% 91% (!) 88%  Weight:      Height:       Body mass index is 29 kg/m.  Physical Exam  Constitutional: She is oriented to person, place, and time.  She is sitting up in bed. She has an emesis basin in her lap and vomited several times during my exam.  HENT:  Clean dry gauze dressing over her left frontal incision.  Cardiovascular: Normal rate and regular rhythm.   No murmur heard. Pulmonary/Chest: Effort normal and breath sounds normal.   Tunneled catheter placed in her right anterior chest yesterday.  Abdominal: Soft. There is no tenderness.  Very quiet bowel sounds.  Neurological: She is alert and oriented to person, place, and time.  Psychiatric: Mood and affect normal.    Lab Results Lab Results  Component Value Date   WBC 9.7 03/20/2017   HGB 12.4 03/20/2017   HCT 38.9 03/20/2017   MCV 87.8 03/20/2017   PLT 225 03/20/2017    Lab Results  Component Value Date   CREATININE 0.82 03/20/2017   BUN 11 03/20/2017   NA 143 03/20/2017   K 3.8 03/20/2017   CL 103  03/20/2017   CO2 29 03/20/2017    Lab Results  Component Value Date   ALT 15 03/17/2017   AST 20 03/17/2017   ALKPHOS 68 03/17/2017   BILITOT 0.8 03/17/2017     Microbiology: Recent Results (from the past 240 hour(s))  Culture, blood (routine x 2)     Status: None (Preliminary result)   Collection Time: 03/17/17  3:05 PM  Result Value Ref Range Status   Specimen Description BLOOD LEFT ANTECUBITAL  Final   Special Requests   Final    BOTTLES DRAWN AEROBIC ONLY Blood Culture adequate volume   Culture NO GROWTH 2 DAYS  Final   Report Status PENDING  Incomplete  Culture, blood (routine x 2)     Status: None (Preliminary result)   Collection Time: 03/17/17  3:11 PM  Result Value Ref Range Status   Specimen Description BLOOD LEFT ARM  Final   Special Requests   Final    BOTTLES DRAWN AEROBIC ONLY Blood Culture adequate volume   Culture NO GROWTH 2 DAYS  Final   Report Status PENDING  Incomplete  MRSA PCR Screening     Status: None   Collection Time: 03/17/17 10:22 PM  Result Value Ref Range Status   MRSA by PCR NEGATIVE NEGATIVE Final    Comment:        The GeneXpert MRSA Assay (FDA approved for NASAL specimens only), is one component of a comprehensive MRSA colonization surveillance program. It is not intended to diagnose MRSA infection nor to guide or monitor treatment for MRSA infections.   Surgical PCR screen     Status: None   Collection Time: 03/18/17 10:51 AM  Result Value Ref Range Status   MRSA, PCR NEGATIVE NEGATIVE Final   Staphylococcus aureus NEGATIVE NEGATIVE Final    Comment:        The Xpert SA Assay (FDA approved for NASAL specimens in patients over 55 years of age), is one component of a comprehensive surveillance program.  Test performance has been validated by [facility] for patients greater than or equal to 72 year old. It is not intended to diagnose infection nor to guide or monitor treatment.   Fungus Culture With Stain     Status: None  (Preliminary result)   Collection Time: 03/18/17  3:04 PM  Result Value Ref Range Status   Fungus Stain Final report  Final    Comment: (NOTE) Performed At: [facility] [place], [place] [id number] Lindon Romp MD EH:[id number]    Fungus (Mycology) Culture PENDING  Incomplete   Fungal Source ABSCESS  Final    Comment: SUBGALEAL  Aerobic/Anaerobic Culture (surgical/deep wound)  Status: None (Preliminary result)   Collection Time: 03/18/17  3:04 PM  Result Value Ref Range Status   Specimen Description ABSCESS SUBGALEAL  Final   Special Requests NONE  Final   Gram Stain   Final    ABUNDANT WBC PRESENT, PREDOMINANTLY PMN FEW GRAM POSITIVE COCCI IN PAIRS    Culture ABUNDANT UNIDENTIFIED ORGANISM  Final   Report Status PENDING  Incomplete  Fungus Culture Result     Status: None   Collection Time: 03/18/17  3:04 PM  Result Value Ref Range Status   Result 1 Comment  Final    Comment: (NOTE) KOH/Calcofluor preparation:  no fungus observed. Performed At: [facility] [place], [place] [id number] Lindon Romp MD [id number]   Fungus Culture With Stain     Status: None (Preliminary result)   Collection Time: 03/18/17  3:05 PM  Result Value Ref Range Status   Fungus Stain Final report  Final    Comment: (NOTE) Performed At: [facility] [place], [place] [id number] Lindon Romp MD [id number]    Fungus (Mycology) Culture PENDING  Incomplete   Fungal Source ABSCESS  Final    Comment: SUBGALEAL SPECIMEN B  Aerobic/Anaerobic Culture (surgical/deep wound)     Status: None (Preliminary result)   Collection Time: 03/18/17  3:05 PM  Result Value Ref Range Status   Specimen Description ABSCESS  Final   Special Requests SUBGALEAL SPECIMEN B  Final   Gram Stain   Final    FEW WBC PRESENT,BOTH PMN AND MONONUCLEAR RARE GRAM POSITIVE COCCI IN PAIRS    Culture FEW UNIDENTIFIED ORGANISM  Final   Report  Status PENDING  Incomplete  Fungus Culture Result     Status: None   Collection Time: 03/18/17  3:05 PM  Result Value Ref Range Status   Result 1 Comment  Final    Comment: (NOTE) KOH/Calcofluor preparation:  no fungus observed. Performed At: [facility] [place], [place] [id number] Lindon Romp MD [id number]   Fungus Culture With Stain     Status: None (Preliminary result)   Collection Time: 03/18/17  3:06 PM  Result Value Ref Range Status   Fungus Stain Final report  Final    Comment: (NOTE) Performed At: [facility] [place], [place] [id number] Lindon Romp MD [id number]    Fungus (Mycology) Culture PENDING  Incomplete   Fungal Source ABSCESS  Final    Comment: SUGALEAL PHLEGMON SPECIMEN C  Aerobic/Anaerobic Culture (surgical/deep wound)     Status: None (Preliminary result)   Collection Time: 03/18/17  3:06 PM  Result Value Ref Range Status   Specimen Description ABSCESS  Final   Special Requests SUBGALEAL PHLEGMON,SPECIMEN C  Final   Gram Stain   Final    FEW WBC PRESENT,BOTH PMN AND MONONUCLEAR NO ORGANISMS SEEN    Culture   Final    FEW UNIDENTIFIED ORGANISM CRITICAL RESULT CALLED TO, READ BACK BY AND VERIFIED WITH: J Airabella Barley,MD AT 1140 03/20/17 BY L BENFIELD CONCERNING GROWTH ON CULTURE    Report Status PENDING  Incomplete  Fungus Culture Result     Status: None   Collection Time: 03/18/17  3:06 PM  Result Value Ref Range Status   Result 1 Comment  Final    Comment: (NOTE) KOH/Calcofluor preparation:  no fungus observed. Performed At: [facility] [place], [place] [id number] Lindon Romp MD [id number]   Fungus Culture With Stain  Status: None (Preliminary result)   Collection Time: 03/18/17  3:07 PM  Result Value Ref Range Status   Fungus Stain Final report  Final    Comment: (NOTE) Performed At: [facility] [place], [place]  [id number] Lindon Romp MD PH:[id number]    Fungus (Mycology) Culture PENDING  Incomplete   Fungal Source BONE  Final    Comment: FLAP  Aerobic/Anaerobic Culture (surgical/deep wound)     Status: None (Preliminary result)   Collection Time: 03/18/17  3:07 PM  Result Value Ref Range Status   Specimen Description BONE  Final   Special Requests BONE FLAP  Final   Gram Stain   Final    ABUNDANT WBC PRESENT,BOTH PMN AND MONONUCLEAR NO ORGANISMS SEEN    Culture   Final    FEW UNIDENTIFIED ORGANISM CRITICAL RESULT CALLED TO, READ BACK BY AND VERIFIED WITH: J Evalin Shawhan,MD AT 1140 03/20/17 BY L BENFIELD CONCERNING GROWTH ON CULTURE    Report Status PENDING  Incomplete  Fungus Culture Result     Status: None   Collection Time: 03/18/17  3:07 PM  Result Value Ref Range Status   Result 1 Comment  Final    Comment: (NOTE) KOH/Calcofluor preparation:  no fungus observed. Performed At: [facility] [address] [place], [place] [id number] [place] [id number]     Michel Bickers, [place] [facility] [place] [facility] [phone] pager   [phone] cell 03/20/2017, 1:44 PM

## 2017-03-21 ENCOUNTER — Inpatient Hospital Stay (HOSPITAL_COMMUNITY): Payer: Medicare Other

## 2017-03-21 DIAGNOSIS — T17908A Unspecified foreign body in respiratory tract, part unspecified causing other injury, initial encounter: Secondary | ICD-10-CM

## 2017-03-21 DIAGNOSIS — J9600 Acute respiratory failure, unspecified whether with hypoxia or hypercapnia: Secondary | ICD-10-CM

## 2017-03-21 LAB — BLOOD GAS, ARTERIAL
ACID-BASE EXCESS: 3.2 mmol/L — AB (ref 0.0–2.0)
BICARBONATE: 27.7 mmol/L (ref 20.0–28.0)
DRAWN BY: 51155
FIO2: 60
LHR: 12 {breaths}/min
O2 SAT: 94.4 %
PEEP/CPAP: 8 cmH2O
PH ART: 7.4 (ref 7.350–7.450)
Patient temperature: 98.2
VT: 400 mL
pCO2 arterial: 45.5 mmHg (ref 32.0–48.0)
pO2, Arterial: 77.3 mmHg — ABNORMAL LOW (ref 83.0–108.0)

## 2017-03-21 LAB — CBC
HCT: 38.8 % (ref 36.0–46.0)
Hemoglobin: 12.2 g/dL (ref 12.0–15.0)
MCH: 27.5 pg (ref 26.0–34.0)
MCHC: 31.4 g/dL (ref 30.0–36.0)
MCV: 87.4 fL (ref 78.0–100.0)
PLATELETS: 248 10*3/uL (ref 150–400)
RBC: 4.44 MIL/uL (ref 3.87–5.11)
RDW: 13.6 % (ref 11.5–15.5)
WBC: 11.6 10*3/uL — AB (ref 4.0–10.5)

## 2017-03-21 LAB — BASIC METABOLIC PANEL
ANION GAP: 8 (ref 5–15)
BUN: 14 mg/dL (ref 6–20)
CALCIUM: 8.9 mg/dL (ref 8.9–10.3)
CO2: 28 mmol/L (ref 22–32)
CREATININE: 0.9 mg/dL (ref 0.44–1.00)
Chloride: 108 mmol/L (ref 101–111)
Glucose, Bld: 140 mg/dL — ABNORMAL HIGH (ref 65–99)
Potassium: 3.7 mmol/L (ref 3.5–5.1)
SODIUM: 144 mmol/L (ref 135–145)

## 2017-03-21 LAB — HEPATIC FUNCTION PANEL
ALT: 12 U/L — ABNORMAL LOW (ref 14–54)
AST: 20 U/L (ref 15–41)
Albumin: 2.6 g/dL — ABNORMAL LOW (ref 3.5–5.0)
Alkaline Phosphatase: 47 U/L (ref 38–126)
BILIRUBIN DIRECT: 0.1 mg/dL (ref 0.1–0.5)
BILIRUBIN INDIRECT: 0.5 mg/dL (ref 0.3–0.9)
TOTAL PROTEIN: 5.1 g/dL — AB (ref 6.5–8.1)
Total Bilirubin: 0.6 mg/dL (ref 0.3–1.2)

## 2017-03-21 LAB — MAGNESIUM: Magnesium: 1.9 mg/dL (ref 1.7–2.4)

## 2017-03-21 LAB — LACTIC ACID, PLASMA
LACTIC ACID, VENOUS: 1.5 mmol/L (ref 0.5–1.9)
Lactic Acid, Venous: 1.8 mmol/L (ref 0.5–1.9)

## 2017-03-21 LAB — LIPASE, BLOOD: LIPASE: 25 U/L (ref 11–51)

## 2017-03-21 LAB — PHOSPHORUS: PHOSPHORUS: 2.7 mg/dL (ref 2.5–4.6)

## 2017-03-21 LAB — AMYLASE: AMYLASE: 43 U/L (ref 28–100)

## 2017-03-21 MED ORDER — CHLORHEXIDINE GLUCONATE 0.12% ORAL RINSE (MEDLINE KIT)
15.0000 mL | Freq: Two times a day (BID) | OROMUCOSAL | Status: DC
  Administered 2017-03-21 – 2017-03-23 (×5): 15 mL via OROMUCOSAL

## 2017-03-21 MED ORDER — SODIUM CHLORIDE 0.9 % IV SOLN: 250.0000 mL | INTRAVENOUS | Status: DC | PRN

## 2017-03-21 MED ORDER — SODIUM CHLORIDE 0.9 % IV BOLUS (SEPSIS)
1000.0000 mL | Freq: Once | INTRAVENOUS | Status: AC
  Administered 2017-03-21: 1000 mL via INTRAVENOUS

## 2017-03-21 MED ORDER — SODIUM CHLORIDE 0.9 % IV SOLN: 250.0000 mL | INTRAVENOUS | Status: DC

## 2017-03-21 MED ORDER — ORAL CARE MOUTH RINSE
15.0000 mL | OROMUCOSAL | Status: DC
  Administered 2017-03-21 – 2017-03-23 (×23): 15 mL via OROMUCOSAL

## 2017-03-21 MED ORDER — CHLORHEXIDINE GLUCONATE CLOTH 2 % EX PADS
6.0000 | MEDICATED_PAD | Freq: Every day | CUTANEOUS | Status: DC
  Administered 2017-03-21: 6 via TOPICAL

## 2017-03-21 MED ORDER — PANTOPRAZOLE SODIUM 40 MG IV SOLR
40.0000 mg | Freq: Every day | INTRAVENOUS | Status: DC
  Administered 2017-03-21 – 2017-03-27 (×7): 40 mg via INTRAVENOUS
  Filled 2017-03-21 (×6): qty 40

## 2017-03-21 NOTE — Consult Note (Signed)
Reason for consult:  Acute hypoxic respiratory failure due to aspiration   In summary: 71 yo F with Hx of hiatal hernia; who had craniotomy on 3/26 for brain abscess. Presented on 8/14 with signs of sepsis and increased drainage from her wound found to have subgaleal abscess. Taken to the OR on 8/15 for Lt frontal cranial wound exploration and removal of bone. Flap. Hospital course complicated by nausea, vomiting and diarrhea. Patient had increased O2 requirement for which we been asked to see the patient.  On my assessment: Patient tachycardiac, normal BP, no fever, O2 sat 90 on NRB  Obtunded but responsive, non-focal SQS2 tachy, no murmur Tachypneic and in mild resp distress, basal crackles Abdomen soft, not tender Warm and well perfused  I reviewed imaging and lab   A/P: Patient is critically ill in the ICU and I am managing the patient for the following  1. Acute hypoxic respiratory failure 2. Aspiration pneumonia 3. Sepsis 4. Encephalopathy 5. Abdominal pain  Plan: - will proceed with intubation  - ABGs and CXR, VAP protocol - OG tube placement. LIS - sedation by dex, prn Dilaudid for pain - fluid resuscitation, check lactic acid - send BAL cultures, patient already on imipenem by ID - check amylase and lipase. Also add LFTs. No plan for CT scan of the abdomen yet because of benigh exam - PPI and Regan, keep NPO - maintain IVF, foley catheter and monitor UOP - SQH in am, SCDs and PPI - Family updated  I spent 45 min of CC time managing the patient  Samul Dada, MD CCM attending

## 2017-03-21 NOTE — Progress Notes (Signed)
Pt transported from 4N31 to CT with SWOT RN, RT and Transport and back to 4N31 without incidence.

## 2017-03-21 NOTE — Progress Notes (Signed)
RT pulled ETT to 20cm at the lip from 22cm.  Order to pull back 2cm.  RT will monitor.

## 2017-03-21 NOTE — Progress Notes (Signed)
eLink Physician-Brief Progress Note Patient Name: KARALEE HAUTER DOB: 05-17-1946 MRN: [id number]   Date of Service  03/21/2017  HPI/Events of Note  Ct Chest and abd reviewed.  Large paraesophageal which may be causing gastric outlet obstruction as no contrast left the stomach.  eICU Interventions  Nothing in stomach Notify attending MD May need general surgical/GI consult tomorrow     Intervention Category Intermediate Interventions: Other:  Mauri Brooklyn, P 03/21/2017, 10:03 PM

## 2017-03-21 NOTE — Consult Note (Signed)
Reason for consult: 03/21/17 Acute hypoxic respiratory failure due to aspiration   In summary: 71 yo F with Hx of hiatal hernia; who had craniotomy on 3/26 for brain abscess. Presented on 8/14 with signs of sepsis and increased drainage from her wound found to have subgaleal abscess. Taken to the OR on 8/15 for Lt frontal cranial wound exploration and removal of bone. Flap. Hospital course complicated by nausea, vomiting and diarrhea. Patient had increased O2 requirement for which we been asked to see the patient. MRI 8/14 with no evidence of recurrent abscess. New small fluid collectin underlyuing the left frontal scalp wound.    Subjective:  Sedated on vent . On precedex. F/c per RN .  Son at bedside, updated.  Fever . Decreased uop .    Objective : Exam  GEN : Sedated on vent .  HEENT : ET  PULM: CTA on vent  CARD : ST  ABD : Soft , NT , BS+  Skin : Warm /no rash    A/P: Patient is critically ill in the ICU and I am managing the patient for the following  1. Acute hypoxic respiratory failure 2. Aspiration pneumonia 3. Sepsis 4. Encephalopathy 5. Abdominal pain w/ n/v. LFT , amylase and lipase nml . ABD film w/ HH , no ileius /obstruction 6. Hypotension /Septic shock  7 . Subgaleal abscess   Plan: Vent  Support with VAP , assess for daily wean .  Cont IV abx. Follow cx data. ID following .  - sedation by dex, prn Dilaudid for pain. Dc/ Lorrin Mais  - fluid resuscitation, tr lactate .  -Wean pressors for MAP >65. D/c lotensin   - check amylase and lipase. Also add LFTs. No plan for CT scan of the abdomen yet because of benigh exam - PPI and Regan, keep NPO, OGT to LIS  - SQH in am, SCDs and PPI - Family updated-son 8/18   Tammy Parrett NP-C  [facility]  [phone]   03/21/2017   STAFF NOTE: Linwood Dibbles, MD FACP have personally reviewed patient's available data, including medical history, events of note, physical examination and test results as  part of my evaluation. I have discussed with resident/NP and other care providers such as pharmacist, RN and RRT. In addition, I personally evaluated patient and elicited key findings of: rass -1, precedex, int sits up and alert, strong ext strength, BS in chest, coarse rt chest exam, ando soft, no r/g, edema none, pcxr peri intubation shows clear cut gas filled hernia in chest that is cause of aspiration  Ileus? Obstruction risk?, needs to stay NPO, advance NGT 8 c, abg reviewed keep same MV, may SBT in am , keep hydrated and pos balance, gotal to peep 5 40%, lactic acid is re assuring, I think we should consider CT abdo with oral contrast assessment, ABX per ID, I updated son at bedside, strep noted The patient is critically ill with multiple organ systems failure and requires high complexity decision making for assessment and support, frequent evaluation and titration of therapies, application of advanced monitoring technologies and extensive interpretation of multiple databases.   Critical Care Time devoted to patient care services described in this note is45 Minutes. This time reflects time of care of this signee: Merrie Roof, MD FACP. This critical care time does not reflect procedure time, or teaching time or supervisory time of PA/NP/Med student/Med Resident etc but could involve care discussion time. Rest per NP/medical resident whose note is outlined  above and that I agree with   Lavon Paganini. Titus Mould, MD, [place] Pgr: [facility] 03/21/2017 12:26 PM

## 2017-03-21 NOTE — Progress Notes (Signed)
Initial Nutrition Assessment  DOCUMENTATION CODES:   Not applicable  INTERVENTION:  If unable to extubate, Recommend Vital High Protein at goal rate of 55 ml/h (1320 ml per day) to provide 1320 kcals, 116 gm protein, 1109 ml free water daily.  NUTRITION DIAGNOSIS:   Inadequate oral intake related to inability to eat as evidenced by NPO status.  GOAL:   Patient will meet greater than or equal to 90% of their needs  MONITOR:   Vent status, Skin, Weight trends, Labs, I & O's  REASON FOR ASSESSMENT:   Ventilator    ASSESSMENT:   71 yo F with Hx of hiatal hernia; who had craniotomy on 3/26 for brain abscess. Presented on 8/14 with signs of sepsis and increased drainage from her wound found to have subgaleal abscess. Taken to the OR on 8/15 for Lt frontal cranial wound exploration and removal of bone flap. Hospital course complicated by nausea, vomiting and diarrhea. Pt with Acute hypoxic respiratory failure due to aspiration.   PROCEDURE (8/15): Left Frontal Cranial Wound Exploration with removal of bone flap (Left)  Patient is currently intubated on ventilator support MV: 6.1 L/min Temp (24hrs), Avg:98.7 F (37.1 C), Min:97.5 F (36.4 C), Max:100.1 F (37.8 C)  Propofol: none  Family at bedside reports pt was eating well PTA with no other difficulties. Noted pt with a 9% weight loss in 4 months per weight records. Pt with no observed significant fat or muscle mass loss. Labs and medications reviewed.   Diet Order:  Diet NPO time specified  Skin:  Wound (see comment) (Open wound/incision on head)  Last BM:  8/16  Height:   Ht Readings from Last 1 Encounters:  03/18/17 5' 1.5" (1.562 m)    Weight:   Wt Readings from Last 1 Encounters:  03/21/17 154 lb 12.2 oz (70.2 kg)    Ideal Body Weight:  48.86 kg  BMI:  Body mass index is 28.77 kg/m.  Estimated Nutritional Needs:   Kcal:  1293  Protein:  100-110 grams  Fluid:  Per MD  EDUCATION NEEDS:   No  education needs identified at this time  Corrin Parker, MS, RD, LDN Pager # [phone] After hours/ weekend pager # [phone]

## 2017-03-21 NOTE — Progress Notes (Signed)
[facility] Progress Note Patient Name: Alexandria [name] DOB: 1945/09/04 MRN: [id number]   Date of Service  03/21/2017  HPI/Events of Note    eICU Interventions  Added pantoprazole SUP prophylaxis     Intervention Category Intermediate Interventions: Other:  Alexandria [name] S. 03/21/2017, 12:06 AM

## 2017-03-21 NOTE — Progress Notes (Signed)
Patient with episode of nausea and vomiting last night with likely aspiration of that and resultant hypoxia requiring intubation. Stable this morning. Oxygen well on ventilator. Prior to intubation the patient was awake and alert. Oriented and appropriate.  Currently sedated but will awaken and follow commands readily.  Respiratory failure yesterday secondary to possible aspiration of that. Monitor for signs of pneumonia. Ventilator management per critical care.

## 2017-03-21 NOTE — Progress Notes (Signed)
eLink Physician-Brief Progress Note Patient Name: Alexandria [name] DOB: 1946/04/28 MRN: [id number]   Date of Service  03/21/2017  HPI/Events of Note  Poor uop  eICU Interventions  1 liter NS bolus     Intervention Category Intermediate Interventions: Oliguria - evaluation and management  Mauri Brooklyn, P 03/21/2017, 8:01 PM

## 2017-03-21 NOTE — Progress Notes (Signed)
INFECTIOUS DISEASE PROGRESS NOTE  ID: Alexandria [name] is a 71 y.o. 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  Hypotensive o/n. Low grade temp 100.1   Abtx:  Anti-infectives    Start     Dose/Rate Route Frequency Ordered Stop   03/19/17 1200  meropenem (MERREM) 2 g in sodium chloride 0.9 % 100 mL IVPB     2 g 200 mL/hr over 30 Minutes Intravenous Every 8 hours 03/19/17 1109     03/19/17 1200  vancomycin (VANCOCIN) IVPB 750 mg/150 ml premix  Status:  Discontinued     750 mg 150 mL/hr over 60 Minutes Intravenous Every 12 hours 03/19/17 1109 03/20/17 1351   03/18/17 1532  bacitracin 50,000 Units in sodium chloride irrigation 0.9 % 500 mL irrigation  Status:  Discontinued       As needed 03/18/17 1533 03/18/17 1555   03/18/17 1530  meropenem (MERREM) 1 g in sodium chloride 0.9 % 100 mL IVPB     1 g 200 mL/hr over 30 Minutes Intravenous To Surgery 03/18/17 1516 03/18/17 1544   03/18/17 1524  vancomycin (VANCOCIN) powder  Status:  Discontinued       As needed 03/18/17 1524 03/18/17 1555   03/18/17 1500  ceFEPIme (MAXIPIME) 2 g in dextrose 5 % 50 mL IVPB  Status:  Discontinued     2 g 100 mL/hr over 30 Minutes Intravenous To Surgery 03/18/17 1459 03/18/17 1512   03/18/17 1428  vancomycin (VANCOCIN) 1-5 GM/200ML-% IVPB    Comments:  Trixie Deis   : cabinet override      03/18/17 1428 03/19/17 0229      Medications:  Scheduled: . benazepril  20 mg Oral Daily  . chlorhexidine gluconate (MEDLINE KIT)  15 mL Mouth Rinse BID  . Chlorhexidine Gluconate Cloth  6 each Topical Daily  . docusate sodium  100 mg Oral BID  . heparin  5,000 Units Subcutaneous Q8H  . mouth rinse  15 mL Mouth Rinse 10 times per day  . metoCLOPramide (REGLAN) injection  5 mg  Intravenous Q6H  . pantoprazole (PROTONIX) IV  40 mg Intravenous Daily  . raloxifene  60 mg Oral Daily  . saccharomyces boulardii  250 mg Oral BID  . sodium chloride flush  3 mL Intravenous Q12H    Objective: Vital signs in last 24 hours: Temp:  [97.5 F (36.4 C)-100.1 F (37.8 C)] 100.1 F (37.8 C) (08/18 0800) Pulse Rate:  [90-121] 100 (08/18 1100) Resp:  [11-36] 16 (08/18 1100) BP: (69-157)/(46-117) 118/72 (08/18 1100) SpO2:  [88 %-100 %] 98 % (08/18 1100) FiO2 (%):  [50 %-60 %] 50 % (08/18 1100) Weight:  [70.2 kg (154 lb 12.2 oz)-70.8 kg (156 lb 1.4 oz)] 70.2 kg (154 lb 12.2 oz) (08/18 0408)   General appearance: no distress Head: wound dressed, clean.  Resp: rhonchi bilaterally and mild Chest wall: R chest central line. Cardio: regular rate and rhythm GI: normal findings: soft, non-tender and abnormal findings:  hypoactive bowel sounds Extremities: edema none  Lab Results  Recent Labs  03/20/17 1106 03/21/17 0354  WBC 9.7 11.6*  HGB 12.4 12.2  HCT 38.9 38.8  NA 143 144  K 3.8 3.7  CL 103 108  CO2 29 28  BUN 11 14  CREATININE 0.82 0.90   Liver Panel  Recent Labs  03/21/17 0107  PROT 5.1*  ALBUMIN 2.6*  AST 20  ALT 12*  ALKPHOS 47  BILITOT 0.6  BILIDIR 0.1  IBILI 0.5   Sedimentation Rate No results for input(s): ESRSEDRATE in the last 72 hours. C-Reactive Protein No results for input(s): CRP in the last 72 hours.  Microbiology: Recent Results (from the past 240 hour(s))  Culture, blood (routine x 2)     Status: None (Preliminary result)   Collection Time: 03/17/17  3:05 PM  Result Value Ref Range Status   Specimen Description BLOOD LEFT ANTECUBITAL  Final   Special Requests   Final    BOTTLES DRAWN AEROBIC ONLY Blood Culture adequate volume   Culture NO GROWTH 3 DAYS  Final   Report Status PENDING  Incomplete  Culture, blood (routine x 2)     Status: None (Preliminary result)   Collection Time: 03/17/17  3:11 PM  Result Value Ref Range  Status   Specimen Description BLOOD LEFT ARM  Final   Special Requests   Final    BOTTLES DRAWN AEROBIC ONLY Blood Culture adequate volume   Culture NO GROWTH 3 DAYS  Final   Report Status PENDING  Incomplete  MRSA PCR Screening     Status: None   Collection Time: 03/17/17 10:22 PM  Result Value Ref Range Status   MRSA by PCR NEGATIVE NEGATIVE Final    Comment:        The GeneXpert MRSA Assay (FDA approved for NASAL specimens only), is one component of a comprehensive MRSA colonization surveillance program. It is not intended to diagnose MRSA infection nor to guide or monitor treatment for MRSA infections.   Surgical PCR screen     Status: None   Collection Time: 03/18/17 10:51 AM  Result Value Ref Range Status   MRSA, PCR NEGATIVE NEGATIVE Final   Staphylococcus aureus NEGATIVE NEGATIVE Final    Comment:        The Xpert SA Assay (FDA approved for NASAL specimens in patients over 52 years of age), is one component of a comprehensive surveillance program.  Test performance has been validated by [facility] for patients greater than or equal to 29 year old. It is not intended to diagnose infection nor to guide or monitor treatment.   Fungus Culture With Stain     Status: None (Preliminary result)   Collection Time: 03/18/17  3:04 PM  Result Value Ref Range Status   Fungus Stain Final report  Final    Comment: (NOTE) Performed At: [facility] [place], [place] [id number] Lindon Romp MD [id number]    Fungus (Mycology) Culture PENDING  Incomplete   Fungal Source ABSCESS  Final    Comment: SUBGALEAL  Aerobic/Anaerobic Culture (surgical/deep wound)     Status: None (Preliminary result)   Collection Time: 03/18/17  3:04 PM  Result Value Ref Range Status   Specimen Description ABSCESS SUBGALEAL  Final   Special Requests NONE  Final   Gram Stain   Final    ABUNDANT WBC PRESENT, PREDOMINANTLY PMN FEW GRAM POSITIVE COCCI IN PAIRS     Culture ABUNDANT VIRIDANS STREPTOCOCCUS  Final   Report Status PENDING  Incomplete  Fungus Culture Result     Status: None   Collection Time: 03/18/17  3:04 PM  Result Value Ref Range Status   Result 1 Comment  Final    Comment: (NOTE) KOH/Calcofluor preparation:  no fungus observed. Performed At: [facility] [place], [place] [id number] Lindon Romp MD [id number]   Fungus Culture With Stain     Status: None (Preliminary result)   Collection Time: 03/18/17  3:05 PM  Result Value Ref Range Status   Fungus Stain Final report  Final    Comment: (NOTE) Performed At: [facility] [place], [place] [id number] Lindon Romp MD [id number]    Fungus (Mycology) Culture PENDING  Incomplete   Fungal Source ABSCESS  Final    Comment: SUBGALEAL SPECIMEN B  Aerobic/Anaerobic Culture (surgical/deep wound)     Status: None (Preliminary result)   Collection Time: 03/18/17  3:05 PM  Result Value Ref Range Status   Specimen Description ABSCESS  Final   Special Requests SUBGALEAL SPECIMEN B  Final   Gram Stain   Final    FEW WBC PRESENT,BOTH PMN AND MONONUCLEAR RARE GRAM POSITIVE COCCI IN PAIRS    Culture FEW VIRIDANS STREPTOCOCCUS  Final   Report Status PENDING  Incomplete  Fungus Culture Result     Status: None   Collection Time: 03/18/17  3:05 PM  Result Value Ref Range Status   Result 1 Comment  Final    Comment: (NOTE) KOH/Calcofluor preparation:  no fungus observed. Performed At: [facility] [place], [place] [id number] Lindon Romp MD [id number]   Fungus Culture With Stain     Status: None (Preliminary result)   Collection Time: 03/18/17  3:06 PM  Result Value Ref Range Status   Fungus Stain Final report  Final    Comment: (NOTE) Performed At: [facility] [place], [place] [id number] Lindon Romp MD [id number]    Fungus (Mycology) Culture PENDING  Incomplete    Fungal Source ABSCESS  Final    Comment: SUGALEAL PHLEGMON SPECIMEN C  Aerobic/Anaerobic Culture (surgical/deep wound)     Status: None (Preliminary result)   Collection Time: 03/18/17  3:06 PM  Result Value Ref Range Status   Specimen Description ABSCESS  Final   Special Requests SUBGALEAL PHLEGMON,SPECIMEN C  Final   Gram Stain   Final    FEW WBC PRESENT,BOTH PMN AND MONONUCLEAR NO ORGANISMS SEEN    Culture   Final    FEW VIRIDANS STREPTOCOCCUS CRITICAL RESULT CALLED TO, READ BACK BY AND VERIFIED WITH: J CAMPBELL,MD AT 1140 03/20/17 BY L BENFIELD CONCERNING GROWTH ON CULTURE    Report Status PENDING  Incomplete   Organism ID, Bacteria VIRIDANS STREPTOCOCCUS  Final      Susceptibility   Viridans streptococcus - MIC*    ERYTHROMYCIN >=8 RESISTANT Resistant     LEVOFLOXACIN 1 SENSITIVE Sensitive     VANCOMYCIN 0.5 SENSITIVE Sensitive     * FEW VIRIDANS STREPTOCOCCUS  Fungus Culture Result     Status: None   Collection Time: 03/18/17  3:06 PM  Result Value Ref Range Status   Result 1 Comment  Final    Comment: (NOTE) KOH/Calcofluor preparation:  no fungus observed. Performed At: [facility] [place], [place] [id number] Lindon Romp MD [id number]   Fungus Culture With Stain     Status: None (Preliminary result)   Collection Time: 03/18/17  3:07 PM  Result Value Ref Range Status   Fungus Stain Final report  Final    Comment: (NOTE) Performed At: [facility] [address] [place], [place] [id number] Lindon Romp MD [id number]    Fungus (Mycology) Culture PENDING  Incomplete   Fungal Source BONE  Final    Comment: FLAP  Aerobic/Anaerobic Culture (surgical/deep wound)     Status: None (Preliminary result)   Collection Time: 03/18/17  3:07 PM  Result Value Ref Range Status   Specimen Description BONE  Final   Special Requests BONE FLAP  Final   Gram Stain   Final    ABUNDANT WBC PRESENT,BOTH PMN AND MONONUCLEAR NO ORGANISMS  SEEN    Culture   Final    FEW VIRIDANS STREPTOCOCCUS CRITICAL RESULT CALLED TO, READ BACK BY AND VERIFIED WITH: J CAMPBELL,MD AT 1140 03/20/17 BY L BENFIELD CONCERNING GROWTH ON CULTURE    Report Status PENDING  Incomplete   Organism ID, Bacteria VIRIDANS STREPTOCOCCUS  Final      Susceptibility   Viridans streptococcus - MIC*    ERYTHROMYCIN >=8 RESISTANT Resistant     LEVOFLOXACIN 1 SENSITIVE Sensitive     VANCOMYCIN 0.5 SENSITIVE Sensitive     * FEW VIRIDANS STREPTOCOCCUS  Fungus Culture Result     Status: None   Collection Time: 03/18/17  3:07 PM  Result Value Ref Range Status   Result 1 Comment  Final    Comment: (NOTE) KOH/Calcofluor preparation:  no fungus observed. Performed At: [facility] [place], [place] [id number] Lindon Romp MD [id number]   Culture, respiratory (tracheal aspirate)     Status: None (Preliminary result)   Collection Time: 03/21/17  1:00 AM  Result Value Ref Range Status   Specimen Description TRACHEAL ASPIRATE  Final   Special Requests NONE  Final   Gram Stain FEW WBC PRESENT, PREDOMINANTLY PMN FEW YEAST   Final   Culture PENDING  Incomplete   Report Status PENDING  Incomplete    Studies/Results: Ir Fluoro Guide Cv Line Right  Result Date: 03/19/2017 INDICATION: Poor venous access. In need of durable intravenous access for antibiotic administration. EXAM: TUNNELED PICC LINE WITH ULTRASOUND AND FLUOROSCOPIC GUIDANCE MEDICATIONS: Patient is currently admitted to the hospital and receiving intravenous antibiotics. The antibiotic was given in an appropriate time interval prior to skin puncture. ANESTHESIA/SEDATION: None FLUOROSCOPY TIME:  12 seconds (2 mGy) COMPLICATIONS: None immediate. PROCEDURE: Informed written consent was obtained from the patient after a discussion of the risks, benefits, and alternatives to treatment. Questions regarding the procedure were encouraged and answered. The right neck and chest were  prepped with chlorhexidine in a sterile fashion, and a sterile drape was applied covering the operative field. Maximum barrier sterile technique with sterile gowns and gloves were used for the procedure. A timeout was performed prior to the initiation of the procedure. After the overlying soft tissues were anesthetized, a small venotomy incision was created and a micropuncture kit was utilized to access the internal jugular vein. Real-time ultrasound guidance was utilized for vascular access including the acquisition of a permanent ultrasound image documenting patency of the accessed vessel. The microwire was utilized to measure appropriate catheter length. The micropuncture sheath was exchanged for a peel-away sheath over a guidewire. A 5 French dual lumen tunneled PICC measuring 21 cm was tunneled in a retrograde fashion from the anterior chest wall to the venotomy incision. The catheter was then placed through the peel-away sheath with tip ultimately positioned at the superior caval-atrial junction. Final catheter positioning was confirmed and documented with a spot radiographic image. The catheter aspirates and flushes normally. The catheter was flushed with appropriate volume heparin dwells. The catheter exit site was secured with a 0-Prolene retention suture. The venotomy incision  was closed with Dermabond and Steri-strips. Dressings were applied. The patient tolerated the procedure well without immediate post procedural complication. FINDINGS: After catheter placement, the tip lies within the superior cavoatrial junction. The catheter aspirates and flushes normally and is ready for immediate use. IMPRESSION: Successful placement of 21 cm dual lumen tunneled PICC catheter via the right internal jugular vein with tip terminating at the superior caval atrial junction. The catheter is ready for immediate use. Electronically Signed   By: Sandi Mariscal M.D.   On: 03/19/2017 17:34   Ir US Guide Vasc Access  Right  Result Date: 03/19/2017 INDICATION: Poor venous access. In need of durable intravenous access for antibiotic administration. EXAM: TUNNELED PICC LINE WITH ULTRASOUND AND FLUOROSCOPIC GUIDANCE MEDICATIONS: Patient is currently admitted to the hospital and receiving intravenous antibiotics. The antibiotic was given in an appropriate time interval prior to skin puncture. ANESTHESIA/SEDATION: None FLUOROSCOPY TIME:  12 seconds (2 mGy) COMPLICATIONS: None immediate. PROCEDURE: Informed written consent was obtained from the patient after a discussion of the risks, benefits, and alternatives to treatment. Questions regarding the procedure were encouraged and answered. The right neck and chest were prepped with chlorhexidine in a sterile fashion, and a sterile drape was applied covering the operative field. Maximum barrier sterile technique with sterile gowns and gloves were used for the procedure. A timeout was performed prior to the initiation of the procedure. After the overlying soft tissues were anesthetized, a small venotomy incision was created and a micropuncture kit was utilized to access the internal jugular vein. Real-time ultrasound guidance was utilized for vascular access including the acquisition of a permanent ultrasound image documenting patency of the accessed vessel. The microwire was utilized to measure appropriate catheter length. The micropuncture sheath was exchanged for a peel-away sheath over a guidewire. A 5 French dual lumen tunneled PICC measuring 21 cm was tunneled in a retrograde fashion from the anterior chest wall to the venotomy incision. The catheter was then placed through the peel-away sheath with tip ultimately positioned at the superior caval-atrial junction. Final catheter positioning was confirmed and documented with a spot radiographic image. The catheter aspirates and flushes normally. The catheter was flushed with appropriate volume heparin dwells. The catheter exit site  was secured with a 0-Prolene retention suture. The venotomy incision was closed with Dermabond and Steri-strips. Dressings were applied. The patient tolerated the procedure well without immediate post procedural complication. FINDINGS: After catheter placement, the tip lies within the superior cavoatrial junction. The catheter aspirates and flushes normally and is ready for immediate use. IMPRESSION: Successful placement of 21 cm dual lumen tunneled PICC catheter via the right internal jugular vein with tip terminating at the superior caval atrial junction. The catheter is ready for immediate use. Electronically Signed   By: Sandi Mariscal M.D.   On: 03/19/2017 17:34   Dg Chest Port 1 View  Result Date: 03/21/2017 CLINICAL DATA:  ETT and OG tube placement EXAM: PORTABLE CHEST 1 VIEW COMPARISON:  03/20/2017 FINDINGS: Endotracheal tube tip is about 3.3 cm superior to carina. Esophageal tube tip is below the diaphragm. Large hiatal hernia re- demonstrated. Small left pleural effusion. Bibasilar atelectasis or infiltrates. Stable cardiomediastinal silhouette. Right-sided central venous catheter tip overlies the SVC. No pneumothorax. Postsurgical changes at the right axilla IMPRESSION: 1. Endotracheal tube tip about 3.3 cm superior to carina. Esophageal tube tip below the diaphragm but not included on the image. 2. Small left pleural effusion. Left greater than right basilar atelectasis or infiltrate 3. Large hiatal hernia  with decreased gaseous distension Electronically Signed   By: Donavan Foil M.D.   On: 03/21/2017 00:09   Dg Chest Port 1 View  Result Date: 03/20/2017 CLINICAL DATA:  Acute onset of nausea and vomiting. Wheezing. Assess for aspiration. Initial encounter. EXAM: PORTABLE CHEST 1 VIEW COMPARISON:  Chest radiograph performed 10/22/2016, and CT of the chest performed 10/23/2016 FINDINGS: The lungs are hypoexpanded. Bibasilar airspace opacities may reflect pneumonia or interstitial edema. Aspiration  cannot be excluded. A small left pleural effusion is noted. No pneumothorax is seen. The cardiomediastinal silhouette is borderline normal in size. A large air-filled hiatal hernia is again noted. No acute osseous abnormalities are seen. Scattered clips are noted overlying the right axilla. Patient's right-sided central line is noted ending about the mid SVC. IMPRESSION: 1. Lungs hypoexpanded. Bibasilar airspace opacities may reflect pneumonia or interstitial edema. Aspiration cannot be excluded, given clinical concern. Small left pleural effusion noted. 2. Large air-filled hiatal hernia noted. Electronically Signed   By: Garald Balding M.D.   On: 03/20/2017 22:49   Dg Abd 2 Views  Result Date: 03/20/2017 CLINICAL DATA:  Vomiting, lower abdominal pain. EXAM: ABDOMEN - 2 VIEW COMPARISON:  CT scan of October 23, 2016. FINDINGS: The bowel gas pattern is normal. There is no evidence of free air. Large hiatal hernia is noted. Phlebolith noted in the pelvis. IMPRESSION: No evidence of bowel obstruction or ileus. Large hiatal hernia is noted. Electronically Signed   By: Marijo Conception, M.D.   On: 03/20/2017 14:01   Dg Abd Portable 1v  Result Date: 03/21/2017 CLINICAL DATA:  Evaluate tube placement. EXAM: PORTABLE ABDOMEN - 1 VIEW COMPARISON:  None. FINDINGS: Nasogastric tube tip projects in LEFT upper quadrant, overall paucity of bowel gas. Large hiatal hernia again demonstrated. Coarse densities projecting LEFT upper quadrant, possibly external to the patient or, representing enteric contents. IMPRESSION: Nasogastric tube tip projects in LEFT upper quadrant, likely in mid to distal stomach given large hiatal hernia. Paucity of bowel gas. Electronically Signed   By: Elon Alas M.D.   On: 03/21/2017 00:03     Assessment/Plan: Brain Abscess Osteo of skull Aspiration Pneumonia VDRF  Total days of antibiotics: 3 merrem  Would continue merrem Not add vanco back unless clinical change.  Await  respiratory cx   Spoke at length with her son about her course. He related she had severe abd pain post op and then n/v for the next two days. He related frustration with not having an answer as to why she has had n/v which resulted in her intubation.  I related several possible causes of her n/v (medications in particular, anesthesia ect) and that as we learned more we would continue to update them.          Bobby Rumpf Infectious Diseases (pager) [phone] [email or web address] 03/21/2017, 11:20 AM  LOS: 4 days

## 2017-03-22 ENCOUNTER — Inpatient Hospital Stay (HOSPITAL_COMMUNITY): Payer: Medicare Other

## 2017-03-22 DIAGNOSIS — R0902 Hypoxemia: Secondary | ICD-10-CM

## 2017-03-22 DIAGNOSIS — F502 Bulimia nervosa: Secondary | ICD-10-CM

## 2017-03-22 DIAGNOSIS — G060 Intracranial abscess and granuloma: Principal | ICD-10-CM

## 2017-03-22 DIAGNOSIS — Z967 Presence of other bone and tendon implants: Secondary | ICD-10-CM

## 2017-03-22 LAB — CULTURE, BLOOD (ROUTINE X 2)
Culture: NO GROWTH
Culture: NO GROWTH
Special Requests: ADEQUATE
Special Requests: ADEQUATE

## 2017-03-22 LAB — BASIC METABOLIC PANEL
ANION GAP: 11 (ref 5–15)
Anion gap: 7 (ref 5–15)
BUN: 7 mg/dL (ref 6–20)
BUN: 9 mg/dL (ref 6–20)
CHLORIDE: 106 mmol/L (ref 101–111)
CHLORIDE: 108 mmol/L (ref 101–111)
CO2: 25 mmol/L (ref 22–32)
CO2: 27 mmol/L (ref 22–32)
CREATININE: 0.74 mg/dL (ref 0.44–1.00)
Calcium: 7.7 mg/dL — ABNORMAL LOW (ref 8.9–10.3)
Calcium: 8 mg/dL — ABNORMAL LOW (ref 8.9–10.3)
Creatinine, Ser: 0.75 mg/dL (ref 0.44–1.00)
GFR calc non Af Amer: >60 mL/min (ref >60)
GFR calc non Af Amer: >60 mL/min (ref >60)
GLUCOSE: 148 mg/dL — AB (ref 65–99)
Glucose, Bld: 127 mg/dL — ABNORMAL HIGH (ref 65–99)
POTASSIUM: 3.7 mmol/L (ref 3.5–5.1)
Potassium: 2.9 mmol/L — ABNORMAL LOW (ref 3.5–5.1)
SODIUM: 142 mmol/L (ref 135–145)
Sodium: 142 mmol/L (ref 135–145)

## 2017-03-22 LAB — CBC
HEMATOCRIT: 33.3 % — AB (ref 36.0–46.0)
HEMOGLOBIN: 10.6 g/dL — AB (ref 12.0–15.0)
MCH: 27.3 pg (ref 26.0–34.0)
MCHC: 31.8 g/dL (ref 30.0–36.0)
MCV: 85.8 fL (ref 78.0–100.0)
Platelets: 225 10*3/uL (ref 150–400)
RBC: 3.88 MIL/uL (ref 3.87–5.11)
RDW: 13.6 % (ref 11.5–15.5)
WBC: 13.2 10*3/uL — ABNORMAL HIGH (ref 4.0–10.5)

## 2017-03-22 LAB — LACTIC ACID, PLASMA: Lactic Acid, Venous: 1.2 mmol/L (ref 0.5–1.9)

## 2017-03-22 MED ORDER — CHLORHEXIDINE GLUCONATE CLOTH 2 % EX PADS
6.0000 | MEDICATED_PAD | Freq: Every day | CUTANEOUS | Status: DC
  Administered 2017-03-23 – 2017-03-27 (×5): 6 via TOPICAL

## 2017-03-22 MED ORDER — SODIUM CHLORIDE 0.9 % IV BOLUS (SEPSIS): 500.0000 mL | Freq: Once | INTRAVENOUS | Status: DC

## 2017-03-22 MED ORDER — POTASSIUM CHLORIDE 10 MEQ/50ML IV SOLN
10.0000 meq | INTRAVENOUS | Status: AC
  Administered 2017-03-22 (×8): 10 meq via INTRAVENOUS
  Filled 2017-03-22 (×6): qty 50

## 2017-03-22 MED ORDER — SODIUM CHLORIDE 0.9 % IV SOLN
0.0000 ug/min | INTRAVENOUS | Status: DC
  Administered 2017-03-22: 20 ug/min via INTRAVENOUS
  Filled 2017-03-22: qty 1

## 2017-03-22 MED ORDER — SODIUM CHLORIDE 0.9 % IV BOLUS (SEPSIS)
1000.0000 mL | Freq: Once | INTRAVENOUS | Status: AC
  Administered 2017-03-22: 1000 mL via INTRAVENOUS

## 2017-03-22 MED ORDER — SODIUM CHLORIDE 0.9 % IV BOLUS (SEPSIS)
500.0000 mL | Freq: Once | INTRAVENOUS | Status: AC
  Administered 2017-03-22: 500 mL via INTRAVENOUS

## 2017-03-22 NOTE — Progress Notes (Signed)
Reason for consult: 03/21/17 Acute hypoxic respiratory failure due to aspiration   In summary: 71 yo F with Hx of hiatal hernia; who had craniotomy on 3/26 for brain abscess. Presented on 8/14 with signs of sepsis and increased drainage from her wound found to have subgaleal abscess. Taken to the OR on 8/15 for Lt frontal cranial wound exploration and removal of bone. Flap. Hospital course complicated by nausea, vomiting and diarrhea. Patient had increased O2 requirement for which we been asked to see the patient. MRI 8/14 with no evidence of recurrent abscess. New small fluid collectin underlyuing the left frontal scalp wound.    Subjective:  CT chest 8/18  w/ BB consolidation , RUL asdpz CT ABD 8/18 with very large paraesophageal hernia , large hepatic cyst  Sedated on vent , on precedex .-f/c  Remains on pressors -weaning  OGT to LIWS -large vol out .     Objective : Exam  GEN : Sedated on vent .  HEENT : ET  PULM: CTA on vent  CARD : ST  ABD : Soft , NT , BS+  Skin : Warm /no rash    A/P:  1. Acute hypoxic respiratory failure 2. Aspiration pneumonia 3. Sepsis 4. Encephalopathy 5. Large paraesophageal hernia  6. Hypotension /Septic shock  7 . Subgaleal abscess  8. Hypokalemia    Plan: Vent  Support with VAP , assess for daily wean .  Cont IV abx. Follow cx data. ID following .   -cont precedex . - fluid resuscitation,  .  -Wean pressors for MAP >65.  - PPI and Regan, keep NPO, OGT to LIS  - SQH in am, SCDs and PPI -K+ replaced    Tammy Parrett NP-C  [facility]  [phone]   03/22/2017   STAFF NOTE: Linwood Dibbles, MD FACP have personally reviewed patient's available data, including medical history, events of note, physical examination and test results as part of my evaluation. I have discussed with resident/NP and other care providers such as pharmacist, RN and RRT. In addition, I personally evaluated patient and elicited key findings  of: awake, follows commands, improved lungs sounds to clear, abdo soft, no edema, pcxr I reviewed with residual rt base haziness, CT I reviewed shws large paresoph hernia in chest and air fluid levels in abdomen with contrast, need to decompress this, Gen surgery to see pt, reluctant to extubate until we have a plan from gen surgery on treatment / decompressioj, agree with NS , will need NGT prior to extubation not OGT, wean now cpap 5 ps5, goal 1 hour, assess rsbi, abx per ID, CT with PNA and ATX bases, k supp needed, re assess bmet in am , allow pos balance with GI issue, avoid probiotic in icu setting - dc, I updated daughter and family in room, precedex with WUA, levophed is off The patient is critically ill with multiple organ systems failure and requires high complexity decision making for assessment and support, frequent evaluation and titration of therapies, application of advanced monitoring technologies and extensive interpretation of multiple databases.   Critical Care Time devoted to patient care services described in this note is 30 Minutes. This time reflects time of care of this signee: Merrie Roof, MD FACP. This critical care time does not reflect procedure time, or teaching time or supervisory time of PA/NP/Med student/Med Resident etc but could involve care discussion time. Rest per NP/medical resident whose note is outlined above and that I agree with  Lavon Paganini. Titus Mould, MD, [place] Pgr: [facility] 03/22/2017 1:04 PM

## 2017-03-22 NOTE — Consult Note (Signed)
Reason for Consult:Large hiatal hernia, nausea, vomiting, gastric outlet obstruction Referring Physician: Dr. Etta Quill is an 71 y.o. female.  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  Came to clinic last week with a bulge from her craniotomy wound incision.  Subsequently found to have a craniotomy flap infection and was taken for debridement on 8/15.  Did well initially, but started having nausea and vomiting a couple of days postoperatively and likely aspirated and intubated for hypoxemia.  She has a history of a large hiatal hernia and has had abdominal pain in the past.  A CT of the abdomen and chest was performed and it demonstrated a large hiatal hernia with complete gastric outlet obstruction.  We were consulted to determine if surgical management is needed.  Past Medical History:  Diagnosis Date  . Cancer [facility]) 1996   breast cx  . Hypertension     Past Surgical History:  Procedure Laterality Date  . ABDOMINAL HYSTERECTOMY    . APPLICATION OF CRANIAL NAVIGATION N/A 10/27/2016   Procedure: APPLICATION OF CRANIAL NAVIGATION;  Surgeon: Erline Levine, MD;  Location: [place];  Service: Neurosurgery;  Laterality: N/A;  . CRANIOTOMY Left 10/27/2016   Procedure: CRANIOTOMY TUMOR EXCISION;  Surgeon: Erline Levine, MD;  Location: [place];  Service: Neurosurgery;  Laterality: Left;  . CRANIOTOMY Left 03/18/2017   Procedure: Left Frontal Cranial Wound Exploration with removal of bone flap;  Surgeon: Ditty, Kevan Ny, MD;  Location: [place];  Service: Neurosurgery;  Laterality: Left;  . IR FLUORO GUIDE CV LINE RIGHT  03/19/2017  . IR GENERIC HISTORICAL  11/02/2016   IR US GUIDE VASC ACCESS RIGHT 11/02/2016 Arne Cleveland, MD [facility]  . IR GENERIC HISTORICAL  11/02/2016   IR FLUORO GUIDE CV LINE RIGHT 11/02/2016 Arne Cleveland, MD [facility]  . IR REMOVAL TUN CV CATH W/O FL  12/10/2016  . IR US GUIDE VASC ACCESS RIGHT   03/19/2017  . MASTECTOMY     right breast    Family History  Problem Relation Age of Onset  . Breast cancer Mother   . Colon cancer Father     Social History:  reports that she has never smoked. She has never used smokeless tobacco. She reports that she drinks alcohol. She reports that she does not use drugs.  Allergies:  Allergies  Allergen Reactions  . Wasp Venom Anaphylaxis  . Ceftriaxone Rash    Pt developed truncal rash on Ceftriaxone, flagyl and also possibly still on keppra. It persisted for weeks with scarring. She did complete 2-3 days of abx while rash still going on. Dermatologist diagnosed as drug rash  . Keppra [Levetiracetam] Rash    Possible drug rash see other notes  . Flagyl [Metronidazole] Rash    Possible drug rash see other notes  . Sulfa Antibiotics Hives    Medications: I have reviewed the patient's current medications.  Results for orders placed or performed during the hospital encounter of 03/17/17 (from the past 48 hour(s))  Blood gas, arterial     Status: Abnormal   Collection Time: 03/20/17 10:48 PM  Result Value Ref Range   FIO2 1.00    Delivery systems NON-REBREATHER OXYGEN MASK    pH, Arterial 7.401 7.350 - 7.450   pCO2 arterial 47.2 32.0 - 48.0 mmHg   pO2, Arterial 80.9 (L) 83.0 - 108.0 mmHg   Bicarbonate 28.9 (H) 20.0 - 28.0 mmol/L   Acid-Base  Excess 4.3 (H) 0.0 - 2.0 mmol/L   O2 Saturation 95.4 %   Patient temperature 97.5    Collection site LEFT RADIAL    Drawn by [phone]    Sample type ARTERIAL DRAW    Allens test (pass/fail) PASS PASS  Blood gas, arterial     Status: Abnormal   Collection Time: 03/21/17 12:50 AM  Result Value Ref Range   FIO2 60.00    Delivery systems VENTILATOR    Mode PRESSURE REGULATED VOLUME CONTROL    VT 400 mL   LHR 12 resp/min   Peep/cpap 8.0 cm H20   pH, Arterial 7.400 7.350 - 7.450   pCO2 arterial 45.5 32.0 - 48.0 mmHg   pO2, Arterial 77.3 (L) 83.0 - 108.0 mmHg   Bicarbonate 27.7 20.0 - 28.0 mmol/L    Acid-Base Excess 3.2 (H) 0.0 - 2.0 mmol/L   O2 Saturation 94.4 %   Patient temperature 98.2    Collection site RIGHT RADIAL    Drawn by [phone]    Sample type ARTERIAL DRAW    Allens test (pass/fail) PASS PASS  Lactic acid, plasma     Status: None   Collection Time: 03/21/17 12:59 AM  Result Value Ref Range   Lactic Acid, Venous 1.5 0.5 - 1.9 mmol/L  Culture, respiratory (tracheal aspirate)     Status: None (Preliminary result)   Collection Time: 03/21/17  1:00 AM  Result Value Ref Range   Specimen Description TRACHEAL ASPIRATE    Special Requests NONE    Gram Stain FEW WBC PRESENT, PREDOMINANTLY PMN FEW YEAST     Culture FEW YEAST    Report Status PENDING   Amylase     Status: None   Collection Time: 03/21/17  1:07 AM  Result Value Ref Range   Amylase 43 28 - 100 U/L  Lipase, blood     Status: None   Collection Time: 03/21/17  1:07 AM  Result Value Ref Range   Lipase 25 11 - 51 U/L  Hepatic function panel     Status: Abnormal   Collection Time: 03/21/17  1:07 AM  Result Value Ref Range   Total Protein 5.1 (L) 6.5 - 8.1 g/dL   Albumin 2.6 (L) 3.5 - 5.0 g/dL   AST 20 15 - 41 U/L   ALT 12 (L) 14 - 54 U/L   Alkaline Phosphatase 47 38 - 126 U/L   Total Bilirubin 0.6 0.3 - 1.2 mg/dL   Bilirubin, Direct 0.1 0.1 - 0.5 mg/dL   Indirect Bilirubin 0.5 0.3 - 0.9 mg/dL  Lactic acid, plasma     Status: None   Collection Time: 03/21/17  3:48 AM  Result Value Ref Range   Lactic Acid, Venous 1.8 0.5 - 1.9 mmol/L  CBC     Status: Abnormal   Collection Time: 03/21/17  3:54 AM  Result Value Ref Range   WBC 11.6 (H) 4.0 - 10.5 K/uL   RBC 4.44 3.87 - 5.11 MIL/uL   Hemoglobin 12.2 12.0 - 15.0 g/dL   HCT 38.8 36.0 - 46.0 %   MCV 87.4 78.0 - 100.0 fL   MCH 27.5 26.0 - 34.0 pg   MCHC 31.4 30.0 - 36.0 g/dL   RDW 13.6 11.5 - 15.5 %   Platelets 248 150 - 400 K/uL  Basic metabolic panel     Status: Abnormal   Collection Time: 03/21/17  3:54 AM  Result Value Ref Range   Sodium 144 135 -  145 mmol/L   Potassium  3.7 3.5 - 5.1 mmol/L   Chloride 108 101 - 111 mmol/L   CO2 28 22 - 32 mmol/L   Glucose, Bld 140 (H) 65 - 99 mg/dL   BUN 14 6 - 20 mg/dL   Creatinine, Ser 0.90 0.44 - 1.00 mg/dL   Calcium 8.9 8.9 - 10.3 mg/dL   GFR calc non Af Amer >60 >60 mL/min   GFR calc Af Amer >60 >60 mL/min    Comment: (NOTE) The eGFR has been calculated using the CKD EPI equation. This calculation has not been validated in all clinical situations. eGFR's persistently <60 mL/min signify possible Chronic Kidney Disease.    Anion gap 8 5 - 15  Magnesium     Status: None   Collection Time: 03/21/17  3:54 AM  Result Value Ref Range   Magnesium 1.9 1.7 - 2.4 mg/dL  Phosphorus     Status: None   Collection Time: 03/21/17  3:54 AM  Result Value Ref Range   Phosphorus 2.7 2.5 - 4.6 mg/dL  CBC     Status: Abnormal   Collection Time: 03/22/17  4:49 AM  Result Value Ref Range   WBC 13.2 (H) 4.0 - 10.5 K/uL   RBC 3.88 3.87 - 5.11 MIL/uL   Hemoglobin 10.6 (L) 12.0 - 15.0 g/dL   HCT 33.3 (L) 36.0 - 46.0 %   MCV 85.8 78.0 - 100.0 fL   MCH 27.3 26.0 - 34.0 pg   MCHC 31.8 30.0 - 36.0 g/dL   RDW 13.6 11.5 - 15.5 %   Platelets 225 150 - 400 K/uL  Basic metabolic panel     Status: Abnormal   Collection Time: 03/22/17  4:49 AM  Result Value Ref Range   Sodium 142 135 - 145 mmol/L   Potassium 2.9 (L) 3.5 - 5.1 mmol/L    Comment: DELTA CHECK NOTED   Chloride 106 101 - 111 mmol/L   CO2 25 22 - 32 mmol/L   Glucose, Bld 148 (H) 65 - 99 mg/dL   BUN 7 6 - 20 mg/dL   Creatinine, Ser 0.74 0.44 - 1.00 mg/dL   Calcium 7.7 (L) 8.9 - 10.3 mg/dL   GFR calc non Af Amer >60 >60 mL/min   GFR calc Af Amer >60 >60 mL/min    Comment: (NOTE) The eGFR has been calculated using the CKD EPI equation. This calculation has not been validated in all clinical situations. eGFR's persistently <60 mL/min signify possible Chronic Kidney Disease.    Anion gap 11 5 - 15  Lactic acid, plasma     Status: None    Collection Time: 03/22/17  4:49 AM  Result Value Ref Range   Lactic Acid, Venous 1.2 0.5 - 1.9 mmol/L    Ct Abdomen Pelvis Wo Contrast  Result Date: 03/21/2017 CLINICAL DATA:  Acute onset of hypoxic respiratory failure. Generalized abdominal pain, nausea and vomiting. Initial encounter. EXAM: CT CHEST, ABDOMEN AND PELVIS WITHOUT CONTRAST TECHNIQUE: Multidetector CT imaging of the chest, abdomen and pelvis was performed following the standard protocol without IV contrast. COMPARISON:  CT of the chest, abdomen and pelvis from 10/23/2016 FINDINGS: CT CHEST FINDINGS Cardiovascular: The heart is borderline normal in size. Scattered calcification is noted along the aortic arch and descending thoracic aorta. Scattered coronary artery calcifications are seen. A right IJ line is noted ending about the mid SVC. Mediastinum/Nodes: The patient's endotracheal tube is seen ending just above the carina. This could be retracted 2 cm. Visualized mediastinal nodes remain normal in size.  No pericardial effusion is identified. 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 The patient's enteric tube is noted ending at the proximal intra-abdominal portion of the stomach. 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 No axillary lymphadenopathy is appreciated. Postoperative change is noted at the right axilla. The visualized portions of the thyroid gland are unremarkable. Lungs/Pleura: Small bilateral pleural effusions are noted. There is partial consolidation of both lower lung lobes, concerning for pneumonia. Additional patchy airspace opacities are noted at the right upper lobe. No pneumothorax is seen. No mass is identified. Musculoskeletal: No acute osseous abnormalities are identified. The visualized musculature is unremarkable in appearance. A right-sided breast implant is noted, status post right-sided mastectomy. CT ABDOMEN PELVIS FINDINGS  Hepatobiliary: A large right hepatic cyst is noted, measuring 14.8 cm in size. Additional hepatic cysts are noted. Apparent vicarious contrast excretion or sludge is noted within the gallbladder. There is suggestion of a tiny stone in the gallbladder. The common bile duct remains normal in caliber. Pancreas: The pancreas is within normal limits. Spleen: The spleen is unremarkable in appearance. Adrenals/Urinary Tract: The adrenal glands are grossly unremarkable in appearance. Nonspecific left-sided perinephric stranding is noted, with fluid along Gerota's fascia on the left. There is mild asymmetric prominence of the left kidney. This could reflect left-sided pyelonephritis. Would correlate clinically. The right kidney is grossly unremarkable. No renal or ureteral stones are identified. There is no evidence of hydronephrosis. Stomach/Bowel: The stomach is unremarkable in appearance. The small bowel is within normal limits. The appendix is normal in caliber, without evidence of appendicitis. The colon is unremarkable in appearance. Vascular/Lymphatic: Scattered calcification is seen along the abdominal aorta and its branches. The abdominal aorta is otherwise grossly unremarkable. The inferior vena cava is grossly unremarkable. No retroperitoneal lymphadenopathy is seen. No pelvic sidewall lymphadenopathy is identified. Reproductive: The bladder is decompressed, with a Foley catheter place. The patient is status post hysterectomy. Trace free fluid is noted within the pelvis, and along the paracolic gutters. Other: No additional soft tissue abnormalities are seen. Musculoskeletal: No acute osseous abnormalities are identified. Subcortical cysts are noted at both hip joints. A vertebral body hemangioma is noted at L1. The visualized musculature is unremarkable in appearance. IMPRESSION: 1. Very large paraesophageal hernia, diffusely distended with contrast. This is more distended than on prior studies. Enteric tube  noted ending at the distended proximal intra-abdominal portion of the stomach. No contrast has progressed beyond the stomach; the chronic paraesophageal hernia may be causing some degree of gastric outlet obstruction. 2. Small bilateral pleural effusions. Partial consolidation of both lower lung lobes, concerning for pneumonia. Additional patchy airspace opacities at the right upper lung lobe. 3. Nonspecific left-sided perinephric stranding, with fluid along Gerota's fascia on the left. Mild asymmetric prominence of the left kidney. This could reflect left-sided pyelonephritis. Would correlate for associated symptoms. 4. Endotracheal tube noted ending just above the carina. This could be retracted 2 cm. 5. Suggestion of minimal cholelithiasis. Apparent vicarious contrast excretion or sludge within the gallbladder. Gallbladder otherwise grossly unremarkable in appearance. 6. Scattered aortic atherosclerosis. 7. Trace free fluid within the pelvis and along the paracolic gutters. 8. Scattered coronary artery calcifications seen. 9. Hepatic cysts, the largest of which measures 14.8 cm in size. These results were called by telephone at the time of interpretation on 03/21/2017 at 9:28 pm to [facility] on [facility], who verbally acknowledged these results. Electronically Signed   By: Francoise Schaumann.D.  On: 03/21/2017 21:30   Ct Chest Wo Contrast  Result Date: 03/21/2017 CLINICAL DATA:  Acute onset of hypoxic respiratory failure. Generalized abdominal pain, nausea and vomiting. Initial encounter. EXAM: CT CHEST, ABDOMEN AND PELVIS WITHOUT CONTRAST TECHNIQUE: Multidetector CT imaging of the chest, abdomen and pelvis was performed following the standard protocol without IV contrast. COMPARISON:  CT of the chest, abdomen and pelvis from 10/23/2016 FINDINGS: CT CHEST FINDINGS Cardiovascular: The heart is borderline normal in size. Scattered calcification is noted along the aortic arch and descending thoracic aorta. Scattered  coronary artery calcifications are seen. A right IJ line is noted ending about the mid SVC. Mediastinum/Nodes: The patient's endotracheal tube is seen ending just above the carina. This could be retracted 2 cm. Visualized mediastinal nodes remain normal in size. No pericardial effusion is identified. 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 The patient's enteric tube is noted ending at the proximal intra-abdominal portion of the stomach. 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 No axillary lymphadenopathy is appreciated. Postoperative change is noted at the right axilla. The visualized portions of the thyroid gland are unremarkable. Lungs/Pleura: Small bilateral pleural effusions are noted. There is partial consolidation of both lower lung lobes, concerning for pneumonia. Additional patchy airspace opacities are noted at the right upper lobe. No pneumothorax is seen. No mass is identified. Musculoskeletal: No acute osseous abnormalities are identified. The visualized musculature is unremarkable in appearance. A right-sided breast implant is noted, status post right-sided mastectomy. CT ABDOMEN PELVIS FINDINGS Hepatobiliary: A large right hepatic cyst is noted, measuring 14.8 cm in size. Additional hepatic cysts are noted. Apparent vicarious contrast excretion or sludge is noted within the gallbladder. There is suggestion of a tiny stone in the gallbladder. The common bile duct remains normal in caliber. Pancreas: The pancreas is within normal limits. Spleen: The spleen is unremarkable in appearance. Adrenals/Urinary Tract: The adrenal glands are grossly unremarkable in appearance. Nonspecific left-sided perinephric stranding is noted, with fluid along Gerota's fascia on the left. There is mild asymmetric prominence of the left kidney. This could reflect left-sided pyelonephritis. Would correlate clinically. The right kidney is grossly  unremarkable. No renal or ureteral stones are identified. There is no evidence of hydronephrosis. Stomach/Bowel: The stomach is unremarkable in appearance. The small bowel is within normal limits. The appendix is normal in caliber, without evidence of appendicitis. The colon is unremarkable in appearance. Vascular/Lymphatic: Scattered calcification is seen along the abdominal aorta and its branches. The abdominal aorta is otherwise grossly unremarkable. The inferior vena cava is grossly unremarkable. No retroperitoneal lymphadenopathy is seen. No pelvic sidewall lymphadenopathy is identified. Reproductive: The bladder is decompressed, with a Foley catheter place. The patient is status post hysterectomy. Trace free fluid is noted within the pelvis, and along the paracolic gutters. Other: No additional soft tissue abnormalities are seen. Musculoskeletal: No acute osseous abnormalities are identified. Subcortical cysts are noted at both hip joints. A vertebral body hemangioma is noted at L1. The visualized musculature is unremarkable in appearance. IMPRESSION: 1. Very large paraesophageal hernia, diffusely distended with contrast. This is more distended than on prior studies. Enteric tube noted ending at the distended proximal intra-abdominal portion of the stomach. No contrast has progressed beyond the stomach; the chronic paraesophageal hernia may be causing some degree of gastric outlet obstruction. 2. Small bilateral pleural effusions. Partial consolidation of both lower lung lobes, concerning for pneumonia. Additional patchy airspace opacities at the right upper lung lobe. 3. Nonspecific  left-sided perinephric stranding, with fluid along Gerota's fascia on the left. Mild asymmetric prominence of the left kidney. This could reflect left-sided pyelonephritis. Would correlate for associated symptoms. 4. Endotracheal tube noted ending just above the carina. This could be retracted 2 cm. 5. Suggestion of minimal  cholelithiasis. Apparent vicarious contrast excretion or sludge within the gallbladder. Gallbladder otherwise grossly unremarkable in appearance. 6. Scattered aortic atherosclerosis. 7. Trace free fluid within the pelvis and along the paracolic gutters. 8. Scattered coronary artery calcifications seen. 9. Hepatic cysts, the largest of which measures 14.8 cm in size. These results were called by telephone at the time of interpretation on 03/21/2017 at 9:28 pm to [facility] on [facility], who verbally acknowledged these results. Electronically Signed   By: Garald Balding M.D.   On: 03/21/2017 21:30   Dg Chest Port 1 View  Result Date: 03/22/2017 CLINICAL DATA:  Followup for respiratory failure. EXAM: PORTABLE CHEST 1 VIEW COMPARISON:  03/20/2017 FINDINGS: Lung base opacities have mildly increased from the prior studies. The upper lungs remain clear. The endotracheal tube, nasal/orogastric tube and right internal jugular central venous line are stable. IMPRESSION: 1. Mild worsening in lung aeration with an increase in lung base opacity, likely due to an increase in pleural fluid and atelectasis and/or pneumonia. Electronically Signed   By: Lajean Manes M.D.   On: 03/22/2017 07:34   Dg Chest Port 1 View  Result Date: 03/21/2017 CLINICAL DATA:  ETT and OG tube placement EXAM: PORTABLE CHEST 1 VIEW COMPARISON:  03/20/2017 FINDINGS: Endotracheal tube tip is about 3.3 cm superior to carina. Esophageal tube tip is below the diaphragm. Large hiatal hernia re- demonstrated. Small left pleural effusion. Bibasilar atelectasis or infiltrates. Stable cardiomediastinal silhouette. Right-sided central venous catheter tip overlies the SVC. No pneumothorax. Postsurgical changes at the right axilla IMPRESSION: 1. Endotracheal tube tip about 3.3 cm superior to carina. Esophageal tube tip below the diaphragm but not included on the image. 2. Small left pleural effusion. Left greater than right basilar atelectasis or infiltrate 3.  Large hiatal hernia with decreased gaseous distension Electronically Signed   By: Donavan Foil M.D.   On: 03/21/2017 00:09   Dg Chest Port 1 View  Result Date: 03/20/2017 CLINICAL DATA:  Acute onset of nausea and vomiting. Wheezing. Assess for aspiration. Initial encounter. EXAM: PORTABLE CHEST 1 VIEW COMPARISON:  Chest radiograph performed 10/22/2016, and CT of the chest performed 10/23/2016 FINDINGS: The lungs are hypoexpanded. Bibasilar airspace opacities may reflect pneumonia or interstitial edema. Aspiration cannot be excluded. A small left pleural effusion is noted. No pneumothorax is seen. The cardiomediastinal silhouette is borderline normal in size. A large air-filled hiatal hernia is again noted. No acute osseous abnormalities are seen. Scattered clips are noted overlying the right axilla. Patient's right-sided central line is noted ending about the mid SVC. IMPRESSION: 1. Lungs hypoexpanded. Bibasilar airspace opacities may reflect pneumonia or interstitial edema. Aspiration cannot be excluded, given clinical concern. Small left pleural effusion noted. 2. Large air-filled hiatal hernia noted. Electronically Signed   By: Garald Balding M.D.   On: 03/20/2017 22:49   Dg Abd Portable 1v  Result Date: 03/21/2017 CLINICAL DATA:  Evaluate tube placement. EXAM: PORTABLE ABDOMEN - 1 VIEW COMPARISON:  None. FINDINGS: Nasogastric tube tip projects in LEFT upper quadrant, overall paucity of bowel gas. Large hiatal hernia again demonstrated. Coarse densities projecting LEFT upper quadrant, possibly external to the patient or, representing enteric contents. IMPRESSION: Nasogastric tube tip projects in LEFT upper quadrant, likely in mid  to distal stomach given large hiatal hernia. Paucity of bowel gas. Electronically Signed   By: Elon Alas M.D.   On: 03/21/2017 00:03    ROS Blood pressure (!) 88/56, pulse 96, temperature 99.3 F (37.4 C), temperature source Oral, resp. rate 18, height 5' 1.5"  (1.562 m), weight 70.6 kg (155 lb 10.3 oz), SpO2 93 %. Physical Exam  Constitutional: She is oriented to person, place, and time. She appears well-developed and well-nourished.  Patient intubated but responsive  HENT:  Head: Atraumatic.  Craniotomy flap closed with drain in place  Cardiovascular: Normal rate and regular rhythm.   Respiratory: Effort normal and breath sounds normal.  Patient is intubated and stable.  Could probably be extubated  GI: Soft. Normal appearance and bowel sounds are normal. There is no tenderness.  1200 cc NGT output of dark green bilious output  Neurological: She is alert and oriented to person, place, and time.  Skin: Skin is warm and dry.    Assessment/Plan: Large hiatal hernia with likely gastric outlet obstruction.  When I reviewed the CT scan it looks as if the 1st and 2nd portion of the duodenum.  It appears as though thiis completely blocking her stomach.  She has no peritonitis or abdominal pain.  Immediate surgical management isnot necessary, but she may require surgery in the future if the decompression with an NGT/OGT does not allow the duodenum to ope up, either with reduction out of the hiatal hernia or decrease in pyloric and duodenal swelling.  Will follow this patient and sign out to the surgical team?DOW for next week.  Cassia Fein 03/22/2017, 6:17 PM

## 2017-03-22 NOTE — Progress Notes (Signed)
[facility] ADULT ICU REPLACEMENT PROTOCOL FOR AM LAB REPLACEMENT ONLY  The patient does apply for the [facility] Adult ICU Electrolyte Replacment Protocol based on the criteria listed below:   1. Is GFR >/= 40 ml/min? Yes.    Patient's GFR today is >60 2. Is urine output >/= 0.5 ml/kg/hr for the last 6 hours? Yes.   Patient's UOP is 1.2 ml/kg/hr 3. Is BUN < 60 mg/dL? Yes.    Patient's BUN today is 7 4. Abnormal electrolyte(s): K 2.9 5. Ordered repletion with: protocol 6. If a panic level lab has been reported, has the CCM MD in charge been notified? No..   Physician:    Ronda Fairly A 03/22/2017 6:35 AM

## 2017-03-22 NOTE — Progress Notes (Signed)
INFECTIOUS DISEASE PROGRESS NOTE  ID: Alexandria [name] is a 71 y.o. 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  Per family wants to get off vent.   Abtx:  Anti-infectives    Start     Dose/Rate Route Frequency Ordered Stop   03/19/17 1200  meropenem (MERREM) 2 g in sodium chloride 0.9 % 100 mL IVPB     2 g 200 mL/hr over 30 Minutes Intravenous Every 8 hours 03/19/17 1109     03/19/17 1200  vancomycin (VANCOCIN) IVPB 750 mg/150 ml premix  Status:  Discontinued     750 mg 150 mL/hr over 60 Minutes Intravenous Every 12 hours 03/19/17 1109 03/20/17 1351   03/18/17 1532  bacitracin 50,000 Units in sodium chloride irrigation 0.9 % 500 mL irrigation  Status:  Discontinued       As needed 03/18/17 1533 03/18/17 1555   03/18/17 1530  meropenem (MERREM) 1 g in sodium chloride 0.9 % 100 mL IVPB     1 g 200 mL/hr over 30 Minutes Intravenous To Surgery 03/18/17 1516 03/18/17 1544   03/18/17 1524  vancomycin (VANCOCIN) powder  Status:  Discontinued       As needed 03/18/17 1524 03/18/17 1555   03/18/17 1500  ceFEPIme (MAXIPIME) 2 g in dextrose 5 % 50 mL IVPB  Status:  Discontinued     2 g 100 mL/hr over 30 Minutes Intravenous To Surgery 03/18/17 1459 03/18/17 1512   03/18/17 1428  vancomycin (VANCOCIN) 1-5 GM/200ML-% IVPB    Comments:  Trixie Deis   : cabinet override      03/18/17 1428 03/19/17 0229      Medications:  Scheduled: . chlorhexidine gluconate (MEDLINE KIT)  15 mL Mouth Rinse BID  . [START ON 03/23/2017] Chlorhexidine Gluconate Cloth  6 each Topical Daily  . docusate sodium  100 mg Oral BID  . heparin  5,000 Units Subcutaneous Q8H  . mouth rinse  15 mL Mouth Rinse 10 times per day  . metoCLOPramide (REGLAN) injection  5 mg  Intravenous Q6H  . pantoprazole (PROTONIX) IV  40 mg Intravenous Daily  . saccharomyces boulardii  250 mg Oral BID  . sodium chloride flush  3 mL Intravenous Q12H    Objective: Vital signs in last 24 hours: Temp:  [98.4 F (36.9 C)-99.7 F (37.6 C)] 99.3 F (37.4 C) (08/19 0818) Pulse Rate:  [96-132] 100 (08/19 1200) Resp:  [0-21] 14 (08/19 1200) BP: (60-151)/(44-85) 99/61 (08/19 1200) SpO2:  [90 %-100 %] 96 % (08/19 1200) FiO2 (%):  [40 %] 40 % (08/19 1145) Weight:  [70.6 kg (155 lb 10.3 oz)] 70.6 kg (155 lb 10.3 oz) (08/19 0345)   General appearance: no distress Head: dressings in place Resp: diminished breath sounds anterior - bilateral Cardio: regular rate and rhythm GI: normal findings: soft, non-tender and abnormal findings:  hypoactive bowel sounds  Lab Results  Recent Labs  03/21/17 0354 03/22/17 0449  WBC 11.6* 13.2*  HGB 12.2 10.6*  HCT 38.8 33.3*  NA 144 142  K 3.7 2.9*  CL 108 106  CO2 28 25  BUN 14 7  CREATININE 0.90 0.74   Liver Panel  Recent Labs  03/21/17 0107  PROT 5.1*  ALBUMIN  2.6*  AST 20  ALT 12*  ALKPHOS 47  BILITOT 0.6  BILIDIR 0.1  IBILI 0.5   Sedimentation Rate No results for input(s): ESRSEDRATE in the last 72 hours. C-Reactive Protein No results for input(s): CRP in the last 72 hours.  Microbiology: Recent Results (from the past 240 hour(s))  Culture, blood (routine x 2)     Status: None (Preliminary result)   Collection Time: 03/17/17  3:05 PM  Result Value Ref Range Status   Specimen Description BLOOD LEFT ANTECUBITAL  Final   Special Requests   Final    BOTTLES DRAWN AEROBIC ONLY Blood Culture adequate volume   Culture NO GROWTH 4 DAYS  Final   Report Status PENDING  Incomplete  Culture, blood (routine x 2)     Status: None (Preliminary result)   Collection Time: 03/17/17  3:11 PM  Result Value Ref Range Status   Specimen Description BLOOD LEFT ARM  Final   Special Requests   Final    BOTTLES DRAWN AEROBIC ONLY  Blood Culture adequate volume   Culture NO GROWTH 4 DAYS  Final   Report Status PENDING  Incomplete  MRSA PCR Screening     Status: None   Collection Time: 03/17/17 10:22 PM  Result Value Ref Range Status   MRSA by PCR NEGATIVE NEGATIVE Final    Comment:        The GeneXpert MRSA Assay (FDA approved for NASAL specimens only), is one component of a comprehensive MRSA colonization surveillance program. It is not intended to diagnose MRSA infection nor to guide or monitor treatment for MRSA infections.   Surgical PCR screen     Status: None   Collection Time: 03/18/17 10:51 AM  Result Value Ref Range Status   MRSA, PCR NEGATIVE NEGATIVE Final   Staphylococcus aureus NEGATIVE NEGATIVE Final    Comment:        The Xpert SA Assay (FDA approved for NASAL specimens in patients over 68 years of age), is one component of a comprehensive surveillance program.  Test performance has been validated by [facility] for patients greater than or equal to 8 year old. It is not intended to diagnose infection nor to guide or monitor treatment.   Fungus Culture With Stain     Status: None (Preliminary result)   Collection Time: 03/18/17  3:04 PM  Result Value Ref Range Status   Fungus Stain Final report  Final    Comment: (NOTE) Performed At: [facility] [place], [place] [id number] Lindon Romp MD [id number]    Fungus (Mycology) Culture PENDING  Incomplete   Fungal Source ABSCESS  Final    Comment: SUBGALEAL  Aerobic/Anaerobic Culture (surgical/deep wound)     Status: None (Preliminary result)   Collection Time: 03/18/17  3:04 PM  Result Value Ref Range Status   Specimen Description ABSCESS SUBGALEAL  Final   Special Requests NONE  Final   Gram Stain   Final    ABUNDANT WBC PRESENT, PREDOMINANTLY PMN FEW GRAM POSITIVE COCCI IN PAIRS    Culture   Final    ABUNDANT VIRIDANS STREPTOCOCCUS SUSCEPTIBILITIES PERFORMED ON PREVIOUS CULTURE WITHIN THE LAST  5 DAYS. NO ANAEROBES ISOLATED; CULTURE IN PROGRESS FOR 5 DAYS    Report Status PENDING  Incomplete  Fungus Culture Result     Status: None   Collection Time: 03/18/17  3:04 PM  Result Value Ref Range Status   Result 1 Comment  Final    Comment: (NOTE) KOH/Calcofluor  preparation:  no fungus observed. Performed At: [facility] [place], [place] [id number] Lindon Romp MD [id number]   Fungus Culture With Stain     Status: None (Preliminary result)   Collection Time: 03/18/17  3:05 PM  Result Value Ref Range Status   Fungus Stain Final report  Final    Comment: (NOTE) Performed At: [facility] [place], [place] [id number] Lindon Romp MD [id number]    Fungus (Mycology) Culture PENDING  Incomplete   Fungal Source ABSCESS  Final    Comment: SUBGALEAL SPECIMEN B  Aerobic/Anaerobic Culture (surgical/deep wound)     Status: None (Preliminary result)   Collection Time: 03/18/17  3:05 PM  Result Value Ref Range Status   Specimen Description ABSCESS  Final   Special Requests SUBGALEAL SPECIMEN B  Final   Gram Stain   Final    FEW WBC PRESENT,BOTH PMN AND MONONUCLEAR RARE GRAM POSITIVE COCCI IN PAIRS    Culture   Final    FEW VIRIDANS STREPTOCOCCUS SUSCEPTIBILITIES PERFORMED ON PREVIOUS CULTURE WITHIN THE LAST 5 DAYS. NO ANAEROBES ISOLATED; CULTURE IN PROGRESS FOR 5 DAYS    Report Status PENDING  Incomplete  Fungus Culture Result     Status: None   Collection Time: 03/18/17  3:05 PM  Result Value Ref Range Status   Result 1 Comment  Final    Comment: (NOTE) KOH/Calcofluor preparation:  no fungus observed. Performed At: [facility] [place], [place] [id number] Lindon Romp MD [id number]   Fungus Culture With Stain     Status: None (Preliminary result)   Collection Time: 03/18/17  3:06 PM  Result Value Ref Range Status   Fungus Stain Final report  Final    Comment: (NOTE) Performed At:  [facility] [place], [place] [id number] Lindon Romp MD [id number]    Fungus (Mycology) Culture PENDING  Incomplete   Fungal Source ABSCESS  Final    Comment: SUGALEAL PHLEGMON SPECIMEN C  Aerobic/Anaerobic Culture (surgical/deep wound)     Status: None (Preliminary result)   Collection Time: 03/18/17  3:06 PM  Result Value Ref Range Status   Specimen Description ABSCESS  Final   Special Requests SUBGALEAL PHLEGMON,SPECIMEN C  Final   Gram Stain   Final    FEW WBC PRESENT,BOTH PMN AND MONONUCLEAR NO ORGANISMS SEEN    Culture   Final    FEW VIRIDANS STREPTOCOCCUS CRITICAL RESULT CALLED TO, READ BACK BY AND VERIFIED WITH: J CAMPBELL,MD AT 1140 03/20/17 BY L BENFIELD CONCERNING GROWTH ON CULTURE NO ANAEROBES ISOLATED; CULTURE IN PROGRESS FOR 5 DAYS    Report Status PENDING  Incomplete   Organism ID, Bacteria VIRIDANS STREPTOCOCCUS  Final      Susceptibility   Viridans streptococcus - MIC*    ERYTHROMYCIN >=8 RESISTANT Resistant     LEVOFLOXACIN 1 SENSITIVE Sensitive     VANCOMYCIN 0.5 SENSITIVE Sensitive     * FEW VIRIDANS STREPTOCOCCUS  Fungus Culture Result     Status: None   Collection Time: 03/18/17  3:06 PM  Result Value Ref Range Status   Result 1 Comment  Final    Comment: (NOTE) KOH/Calcofluor preparation:  no fungus observed. Performed At: [facility] [place], [place] [id number] Lindon Romp MD [id number]   Fungus Culture With Stain     Status: None (Preliminary result)   Collection Time: 03/18/17  3:07 PM  Result Value Ref Range Status  Fungus Stain Final report  Final    Comment: (NOTE) Performed At: [facility] [place], [place] [id number] Lindon Romp MD [id number]    Fungus (Mycology) Culture PENDING  Incomplete   Fungal Source BONE  Final    Comment: FLAP  Aerobic/Anaerobic Culture (surgical/deep wound)     Status: None (Preliminary result)   Collection Time:  03/18/17  3:07 PM  Result Value Ref Range Status   Specimen Description BONE  Final   Special Requests BONE FLAP  Final   Gram Stain   Final    ABUNDANT WBC PRESENT,BOTH PMN AND MONONUCLEAR NO ORGANISMS SEEN    Culture   Final    FEW VIRIDANS STREPTOCOCCUS CRITICAL RESULT CALLED TO, READ BACK BY AND VERIFIED WITH: J CAMPBELL,MD AT 1140 03/20/17 BY L BENFIELD CONCERNING GROWTH ON CULTURE NO ANAEROBES ISOLATED; CULTURE IN PROGRESS FOR 5 DAYS    Report Status PENDING  Incomplete   Organism ID, Bacteria VIRIDANS STREPTOCOCCUS  Final      Susceptibility   Viridans streptococcus - MIC*    ERYTHROMYCIN >=8 RESISTANT Resistant     LEVOFLOXACIN 1 SENSITIVE Sensitive     VANCOMYCIN 0.5 SENSITIVE Sensitive     * FEW VIRIDANS STREPTOCOCCUS  Fungus Culture Result     Status: None   Collection Time: 03/18/17  3:07 PM  Result Value Ref Range Status   Result 1 Comment  Final    Comment: (NOTE) KOH/Calcofluor preparation:  no fungus observed. Performed At: [facility] [place], [place] [id number] Lindon Romp MD [id number]   Culture, respiratory (tracheal aspirate)     Status: None (Preliminary result)   Collection Time: 03/21/17  1:00 AM  Result Value Ref Range Status   Specimen Description TRACHEAL ASPIRATE  Final   Special Requests NONE  Final   Gram Stain FEW WBC PRESENT, PREDOMINANTLY PMN FEW YEAST   Final   Culture PENDING  Incomplete   Report Status PENDING  Incomplete    Studies/Results: Ct Abdomen Pelvis Wo Contrast  Result Date: 03/21/2017 CLINICAL DATA:  Acute onset of hypoxic respiratory failure. Generalized abdominal pain, nausea and vomiting. Initial encounter. EXAM: CT CHEST, ABDOMEN AND PELVIS WITHOUT CONTRAST TECHNIQUE: Multidetector CT imaging of the chest, abdomen and pelvis was performed following the standard protocol without IV contrast. COMPARISON:  CT of the chest, abdomen and pelvis from 10/23/2016 FINDINGS: CT CHEST FINDINGS  Cardiovascular: The heart is borderline normal in size. Scattered calcification is noted along the aortic arch and descending thoracic aorta. Scattered coronary artery calcifications are seen. A right IJ line is noted ending about the mid SVC. Mediastinum/Nodes: The patient's endotracheal tube is seen ending just above the carina. This could be retracted 2 cm. Visualized mediastinal nodes remain normal in size. No pericardial effusion is identified. 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 The patient's enteric tube is noted ending at the proximal intra-abdominal portion of the stomach. 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 No axillary lymphadenopathy is appreciated. Postoperative change is noted at the right axilla. The visualized portions of the thyroid gland are unremarkable. Lungs/Pleura: Small bilateral pleural effusions are noted. There is partial consolidation of both lower lung lobes, concerning for pneumonia. Additional patchy airspace opacities are noted at the right upper lobe. No pneumothorax is seen. No mass is identified. Musculoskeletal: No acute osseous abnormalities are identified. The visualized musculature is unremarkable in appearance. A right-sided breast implant is noted,  status post right-sided mastectomy. CT ABDOMEN PELVIS FINDINGS Hepatobiliary: A large right hepatic cyst is noted, measuring 14.8 cm in size. Additional hepatic cysts are noted. Apparent vicarious contrast excretion or sludge is noted within the gallbladder. There is suggestion of a tiny stone in the gallbladder. The common bile duct remains normal in caliber. Pancreas: The pancreas is within normal limits. Spleen: The spleen is unremarkable in appearance. Adrenals/Urinary Tract: The adrenal glands are grossly unremarkable in appearance. Nonspecific left-sided perinephric stranding is noted, with fluid along Gerota's fascia on the left. There  is mild asymmetric prominence of the left kidney. This could reflect left-sided pyelonephritis. Would correlate clinically. The right kidney is grossly unremarkable. No renal or ureteral stones are identified. There is no evidence of hydronephrosis. Stomach/Bowel: The stomach is unremarkable in appearance. The small bowel is within normal limits. The appendix is normal in caliber, without evidence of appendicitis. The colon is unremarkable in appearance. Vascular/Lymphatic: Scattered calcification is seen along the abdominal aorta and its branches. The abdominal aorta is otherwise grossly unremarkable. The inferior vena cava is grossly unremarkable. No retroperitoneal lymphadenopathy is seen. No pelvic sidewall lymphadenopathy is identified. Reproductive: The bladder is decompressed, with a Foley catheter place. The patient is status post hysterectomy. Trace free fluid is noted within the pelvis, and along the paracolic gutters. Other: No additional soft tissue abnormalities are seen. Musculoskeletal: No acute osseous abnormalities are identified. Subcortical cysts are noted at both hip joints. A vertebral body hemangioma is noted at L1. The visualized musculature is unremarkable in appearance. IMPRESSION: 1. Very large paraesophageal hernia, diffusely distended with contrast. This is more distended than on prior studies. Enteric tube noted ending at the distended proximal intra-abdominal portion of the stomach. No contrast has progressed beyond the stomach; the chronic paraesophageal hernia may be causing some degree of gastric outlet obstruction. 2. Small bilateral pleural effusions. Partial consolidation of both lower lung lobes, concerning for pneumonia. Additional patchy airspace opacities at the right upper lung lobe. 3. Nonspecific left-sided perinephric stranding, with fluid along Gerota's fascia on the left. Mild asymmetric prominence of the left kidney. This could reflect left-sided pyelonephritis. Would  correlate for associated symptoms. 4. Endotracheal tube noted ending just above the carina. This could be retracted 2 cm. 5. Suggestion of minimal cholelithiasis. Apparent vicarious contrast excretion or sludge within the gallbladder. Gallbladder otherwise grossly unremarkable in appearance. 6. Scattered aortic atherosclerosis. 7. Trace free fluid within the pelvis and along the paracolic gutters. 8. Scattered coronary artery calcifications seen. 9. Hepatic cysts, the largest of which measures 14.8 cm in size. These results were called by telephone at the time of interpretation on 03/21/2017 at 9:28 pm to [facility] on [facility], who verbally acknowledged these results. Electronically Signed   By: Garald Balding M.D.   On: 03/21/2017 21:30   Ct Chest Wo Contrast  Result Date: 03/21/2017 CLINICAL DATA:  Acute onset of hypoxic respiratory failure. Generalized abdominal pain, nausea and vomiting. Initial encounter. EXAM: CT CHEST, ABDOMEN AND PELVIS WITHOUT CONTRAST TECHNIQUE: Multidetector CT imaging of the chest, abdomen and pelvis was performed following the standard protocol without IV contrast. COMPARISON:  CT of the chest, abdomen and pelvis from 10/23/2016 FINDINGS: CT CHEST FINDINGS Cardiovascular: The heart is borderline normal in size. Scattered calcification is noted along the aortic arch and descending thoracic aorta. Scattered coronary artery calcifications are seen. A right IJ line is noted ending about the mid SVC. Mediastinum/Nodes: The patient's endotracheal tube is seen ending just above the carina. This could  be retracted 2 cm. Visualized mediastinal nodes remain normal in size. No pericardial effusion is identified. 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 The patient's enteric tube is noted ending at the proximal intra-abdominal portion of the stomach. 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 No axillary lymphadenopathy  is appreciated. Postoperative change is noted at the right axilla. The visualized portions of the thyroid gland are unremarkable. Lungs/Pleura: Small bilateral pleural effusions are noted. There is partial consolidation of both lower lung lobes, concerning for pneumonia. Additional patchy airspace opacities are noted at the right upper lobe. No pneumothorax is seen. No mass is identified. Musculoskeletal: No acute osseous abnormalities are identified. The visualized musculature is unremarkable in appearance. A right-sided breast implant is noted, status post right-sided mastectomy. CT ABDOMEN PELVIS FINDINGS Hepatobiliary: A large right hepatic cyst is noted, measuring 14.8 cm in size. Additional hepatic cysts are noted. Apparent vicarious contrast excretion or sludge is noted within the gallbladder. There is suggestion of a tiny stone in the gallbladder. The common bile duct remains normal in caliber. Pancreas: The pancreas is within normal limits. Spleen: The spleen is unremarkable in appearance. Adrenals/Urinary Tract: The adrenal glands are grossly unremarkable in appearance. Nonspecific left-sided perinephric stranding is noted, with fluid along Gerota's fascia on the left. There is mild asymmetric prominence of the left kidney. This could reflect left-sided pyelonephritis. Would correlate clinically. The right kidney is grossly unremarkable. No renal or ureteral stones are identified. There is no evidence of hydronephrosis. Stomach/Bowel: The stomach is unremarkable in appearance. The small bowel is within normal limits. The appendix is normal in caliber, without evidence of appendicitis. The colon is unremarkable in appearance. Vascular/Lymphatic: Scattered calcification is seen along the abdominal aorta and its branches. The abdominal aorta is otherwise grossly unremarkable. The inferior vena cava is grossly unremarkable. No retroperitoneal lymphadenopathy is seen. No pelvic sidewall lymphadenopathy is  identified. Reproductive: The bladder is decompressed, with a Foley catheter place. The patient is status post hysterectomy. Trace free fluid is noted within the pelvis, and along the paracolic gutters. Other: No additional soft tissue abnormalities are seen. Musculoskeletal: No acute osseous abnormalities are identified. Subcortical cysts are noted at both hip joints. A vertebral body hemangioma is noted at L1. The visualized musculature is unremarkable in appearance. IMPRESSION: 1. Very large paraesophageal hernia, diffusely distended with contrast. This is more distended than on prior studies. Enteric tube noted ending at the distended proximal intra-abdominal portion of the stomach. No contrast has progressed beyond the stomach; the chronic paraesophageal hernia may be causing some degree of gastric outlet obstruction. 2. Small bilateral pleural effusions. Partial consolidation of both lower lung lobes, concerning for pneumonia. Additional patchy airspace opacities at the right upper lung lobe. 3. Nonspecific left-sided perinephric stranding, with fluid along Gerota's fascia on the left. Mild asymmetric prominence of the left kidney. This could reflect left-sided pyelonephritis. Would correlate for associated symptoms. 4. Endotracheal tube noted ending just above the carina. This could be retracted 2 cm. 5. Suggestion of minimal cholelithiasis. Apparent vicarious contrast excretion or sludge within the gallbladder. Gallbladder otherwise grossly unremarkable in appearance. 6. Scattered aortic atherosclerosis. 7. Trace free fluid within the pelvis and along the paracolic gutters. 8. Scattered coronary artery calcifications seen. 9. Hepatic cysts, the largest of which measures 14.8 cm in size. These results were called by telephone at the time of interpretation on 03/21/2017 at 9:28 pm to [facility] on [facility], who verbally acknowledged these results.  Electronically Signed   By: Garald Balding M.D.   On: 03/21/2017  21:30   Dg Chest Port 1 View  Result Date: 03/22/2017 CLINICAL DATA:  Followup for respiratory failure. EXAM: PORTABLE CHEST 1 VIEW COMPARISON:  03/20/2017 FINDINGS: Lung base opacities have mildly increased from the prior studies. The upper lungs remain clear. The endotracheal tube, nasal/orogastric tube and right internal jugular central venous line are stable. IMPRESSION: 1. Mild worsening in lung aeration with an increase in lung base opacity, likely due to an increase in pleural fluid and atelectasis and/or pneumonia. Electronically Signed   By: Lajean Manes M.D.   On: 03/22/2017 07:34   Dg Chest Port 1 View  Result Date: 03/21/2017 CLINICAL DATA:  ETT and OG tube placement EXAM: PORTABLE CHEST 1 VIEW COMPARISON:  03/20/2017 FINDINGS: Endotracheal tube tip is about 3.3 cm superior to carina. Esophageal tube tip is below the diaphragm. Large hiatal hernia re- demonstrated. Small left pleural effusion. Bibasilar atelectasis or infiltrates. Stable cardiomediastinal silhouette. Right-sided central venous catheter tip overlies the SVC. No pneumothorax. Postsurgical changes at the right axilla IMPRESSION: 1. Endotracheal tube tip about 3.3 cm superior to carina. Esophageal tube tip below the diaphragm but not included on the image. 2. Small left pleural effusion. Left greater than right basilar atelectasis or infiltrate 3. Large hiatal hernia with decreased gaseous distension Electronically Signed   By: Donavan Foil M.D.   On: 03/21/2017 00:09   Dg Chest Port 1 View  Result Date: 03/20/2017 CLINICAL DATA:  Acute onset of nausea and vomiting. Wheezing. Assess for aspiration. Initial encounter. EXAM: PORTABLE CHEST 1 VIEW COMPARISON:  Chest radiograph performed 10/22/2016, and CT of the chest performed 10/23/2016 FINDINGS: The lungs are hypoexpanded. Bibasilar airspace opacities may reflect pneumonia or interstitial edema. Aspiration cannot be excluded. A small left pleural effusion is noted. No  pneumothorax is seen. The cardiomediastinal silhouette is borderline normal in size. A large air-filled hiatal hernia is again noted. No acute osseous abnormalities are seen. Scattered clips are noted overlying the right axilla. Patient's right-sided central line is noted ending about the mid SVC. IMPRESSION: 1. Lungs hypoexpanded. Bibasilar airspace opacities may reflect pneumonia or interstitial edema. Aspiration cannot be excluded, given clinical concern. Small left pleural effusion noted. 2. Large air-filled hiatal hernia noted. Electronically Signed   By: Garald Balding M.D.   On: 03/20/2017 22:49   Dg Abd 2 Views  Result Date: 03/20/2017 CLINICAL DATA:  Vomiting, lower abdominal pain. EXAM: ABDOMEN - 2 VIEW COMPARISON:  CT scan of October 23, 2016. FINDINGS: The bowel gas pattern is normal. There is no evidence of free air. Large hiatal hernia is noted. Phlebolith noted in the pelvis. IMPRESSION: No evidence of bowel obstruction or ileus. Large hiatal hernia is noted. Electronically Signed   By: Marijo Conception, M.D.   On: 03/20/2017 14:01   Dg Abd Portable 1v  Result Date: 03/21/2017 CLINICAL DATA:  Evaluate tube placement. EXAM: PORTABLE ABDOMEN - 1 VIEW COMPARISON:  None. FINDINGS: Nasogastric tube tip projects in LEFT upper quadrant, overall paucity of bowel gas. Large hiatal hernia again demonstrated. Coarse densities projecting LEFT upper quadrant, possibly external to the patient or, representing enteric contents. IMPRESSION: Nasogastric tube tip projects in LEFT upper quadrant, likely in mid to distal stomach given large hiatal hernia. Paucity of bowel gas. Electronically Signed   By: Elon Alas M.D.   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  Explained at length to family.     Bobby Rumpf Infectious Diseases (pager) [phone] [email or web address] 03/22/2017, 12:23 PM  LOS: 5 days

## 2017-03-22 NOTE — Progress Notes (Addendum)
Patient remains intubated in ICU. Patient wide awake and following commands readily. She is anxious to be extubated.  She has a low-grade fever with a MAXIMUM TEMPERATURE of 100.1. She does not appear to be toxic. Vitals are otherwise stable. Urine output is good. OG tube output 1500 yesterday abdomen soft and non-tender and nondistended. Hypoactive bowel sounds.  CT scan of chest and abdomen demonstrates evidence of a large paraesophageal hernia with some degree of gastric outlet obstruction. No evidence of obvious volvulus.  No evidence of significant pneumonia.  Overall looks good neurologically. Main issue moving forward is groin to be this. Esophageal hernia and proximal obstruction. I will talk with Gen. surgery about options for treatment. Continue with OG tube decompression. If patient is able to be extubated then we'll need placement of an  NG-tube.

## 2017-03-22 NOTE — Progress Notes (Signed)
Pharmacy Antibiotic Note  Alexandria [name] is a 71 y.o. female admitted on 03/17/2017  Wound infection at previous craniotomy site.  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 Also with possible aspiration PNA. Afebrile, WBC 13.2.  Plan: Continue meropenem 2g IV q8h Monitor clinical picture, renal function F/U ID recs, C&S, abx deescalation / LOT  Height: 5' 1.5" (156.2 cm) Weight: 155 lb 10.3 oz (70.6 kg) IBW/kg (Calculated) : 48.95  Temp (24hrs), Avg:99 F (37.2 C), Min:98.4 F (36.9 C), Max:99.7 F (37.6 C)   Recent Labs Lab 03/17/17 1453 03/20/17 1106 03/21/17 0059 03/21/17 0348 03/21/17 0354 03/22/17 0449  WBC 8.2 9.7  --   --  11.6* 13.2*  CREATININE 0.80 0.82  --   --  0.90 0.74  LATICACIDVEN  --   --  1.5 1.8  --  1.2    Estimated Creatinine Clearance: 58.7 mL/min (by C-G formula based on SCr of 0.74 mg/dL).    Allergies  Allergen Reactions  . Wasp Venom Anaphylaxis  . Ceftriaxone Rash    Pt developed truncal rash on Ceftriaxone, flagyl and also possibly still on keppra. It persisted for weeks with scarring. She did complete 2-3 days of abx while rash still going on. Dermatologist diagnosed as drug rash  . Keppra [Levetiracetam] Rash    Possible drug rash see other notes  . Flagyl [Metronidazole] Rash    Possible drug rash see other notes  . Sulfa Antibiotics Hives   Meropenem 8/15 >> Vancomycin 8/15 >> 8/17  8/14 blood x 2 cx: ngtd 8/15 abscess cx: Viridans Strep 8/18: TA Cx: few yeast (pending)  Elenor Quinones, PharmD, BCPS Clinical Pharmacist Pager [phone] 03/22/2017 11:03 AM

## 2017-03-23 ENCOUNTER — Inpatient Hospital Stay (HOSPITAL_COMMUNITY): Payer: Medicare Other

## 2017-03-23 DIAGNOSIS — J9600 Acute respiratory failure, unspecified whether with hypoxia or hypercapnia: Secondary | ICD-10-CM

## 2017-03-23 DIAGNOSIS — J690 Pneumonitis due to inhalation of food and vomit: Secondary | ICD-10-CM

## 2017-03-23 LAB — AEROBIC/ANAEROBIC CULTURE (SURGICAL/DEEP WOUND)

## 2017-03-23 LAB — CBC
HCT: 32.7 % — ABNORMAL LOW (ref 36.0–46.0)
HEMOGLOBIN: 10.1 g/dL — AB (ref 12.0–15.0)
MCH: 26.6 pg (ref 26.0–34.0)
MCHC: 30.9 g/dL (ref 30.0–36.0)
MCV: 86.3 fL (ref 78.0–100.0)
Platelets: 228 10*3/uL (ref 150–400)
RBC: 3.79 MIL/uL — ABNORMAL LOW (ref 3.87–5.11)
RDW: 13.6 % (ref 11.5–15.5)
WBC: 12.4 10*3/uL — ABNORMAL HIGH (ref 4.0–10.5)

## 2017-03-23 LAB — BASIC METABOLIC PANEL
ANION GAP: 5 (ref 5–15)
BUN: 10 mg/dL (ref 6–20)
CALCIUM: 8.1 mg/dL — AB (ref 8.9–10.3)
CO2: 28 mmol/L (ref 22–32)
CREATININE: 0.75 mg/dL (ref 0.44–1.00)
Chloride: 110 mmol/L (ref 101–111)
GLUCOSE: 117 mg/dL — AB (ref 65–99)
Potassium: 3.5 mmol/L (ref 3.5–5.1)
Sodium: 143 mmol/L (ref 135–145)

## 2017-03-23 LAB — AEROBIC/ANAEROBIC CULTURE W GRAM STAIN (SURGICAL/DEEP WOUND)

## 2017-03-23 LAB — CULTURE, RESPIRATORY

## 2017-03-23 MED ORDER — SODIUM CHLORIDE 0.9 % IV SOLN
INTRAVENOUS | Status: AC
  Administered 2017-03-23: 19:00:00 via INTRAVENOUS

## 2017-03-23 MED ORDER — ALBUTEROL SULFATE (2.5 MG/3ML) 0.083% IN NEBU
2.5000 mg | INHALATION_SOLUTION | Freq: Four times a day (QID) | RESPIRATORY_TRACT | Status: DC
  Administered 2017-03-23 – 2017-03-24 (×4): 2.5 mg via RESPIRATORY_TRACT
  Filled 2017-03-23 (×5): qty 3

## 2017-03-23 MED ORDER — SODIUM CHLORIDE 0.9 % IV SOLN: 250.0000 mL | INTRAVENOUS | Status: DC

## 2017-03-23 MED ORDER — ORAL CARE MOUTH RINSE
15.0000 mL | Freq: Two times a day (BID) | OROMUCOSAL | Status: DC
  Administered 2017-03-24 – 2017-03-26 (×4): 15 mL via OROMUCOSAL

## 2017-03-23 MED ORDER — CHLORHEXIDINE GLUCONATE 0.12 % MT SOLN
15.0000 mL | Freq: Two times a day (BID) | OROMUCOSAL | Status: DC
  Administered 2017-03-23 – 2017-03-26 (×7): 15 mL via OROMUCOSAL
  Filled 2017-03-23 (×6): qty 15

## 2017-03-23 MED ORDER — ACETYLCYSTEINE 20 % IN SOLN
4.0000 mL | Freq: Four times a day (QID) | RESPIRATORY_TRACT | Status: DC
  Administered 2017-03-23 – 2017-03-24 (×4): 4 mL via RESPIRATORY_TRACT
  Filled 2017-03-23 (×8): qty 4

## 2017-03-23 MED ORDER — POLYVINYL ALCOHOL 1.4 % OP SOLN
2.0000 [drp] | OPHTHALMIC | Status: DC | PRN
  Administered 2017-03-23: 2 [drp] via OPHTHALMIC
  Filled 2017-03-23: qty 15

## 2017-03-23 NOTE — Progress Notes (Signed)
Patient ID: Alexandria [name], female   DOB: Mar 16, 1946, 71 y.o.   MRN: [id number]          [facility] Infectious Disease  Date of Admission:  03/17/2017           Day 5 meropenem ASSESSMENT: Her subgaleal abscess cultures grew viridans strep. She has a beta-lactam allergies so I plan on continuing meropenem for 6 weeks total. Postoperatively she developed nausea and vomiting and was found to have gastric outlet obstruction. She developed hypoxia and was intubated. She probably has some early chemical pneumonitis from aspiration of gastric contents. Hopefully she will be extubated today.  PLAN: 1. 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 chlorhexidine gluconate (MEDLINE KIT)  15 mL Mouth Rinse BID  . Chlorhexidine Gluconate Cloth  6 each Topical Daily  . heparin  5,000 Units Subcutaneous Q8H  . mouth rinse  15 mL Mouth Rinse 10 times per day  . pantoprazole (PROTONIX) IV  40 mg Intravenous Daily  . sodium chloride flush  3 mL Intravenous Q12H   Review of Systems: Review of Systems  Unable to perform ROS: Intubated    Allergies  Allergen Reactions  . Wasp Venom Anaphylaxis  . Ceftriaxone Rash    Pt developed truncal rash on Ceftriaxone, flagyl and also possibly still on keppra. It persisted for weeks with scarring. She did complete 2-3 days of abx while rash still going on. Dermatologist diagnosed as drug rash  . Keppra [Levetiracetam] Rash    Possible drug rash see other notes  . Flagyl [Metronidazole] Rash    Possible drug rash see other notes  . Sulfa Antibiotics Hives    OBJECTIVE: Vitals:   03/23/17 0700 03/23/17 0730 03/23/17 0745 03/23/17 0800  BP:  107/66 116/69 116/69 127/71  Pulse: 88 87 87 87  Resp: 18 17 (!) 22 (!) 23  Temp:    99.6 F (37.6 C)  TempSrc:    Axillary  SpO2: 94% 94% 94% 94%  Weight:      Height:       Body mass index is 30 kg/m.  Physical Exam  Constitutional:  She remains intubated. However she is alert and appears comfortable.  HENT:  Clean dressing over her left frontal incision.  Cardiovascular: Normal rate and regular rhythm.   No murmur heard. Pulmonary/Chest: Effort normal and breath sounds normal. She has no wheezes. She has no rales.  Neurological: She is alert.  Skin: No rash noted.    Lab Results Lab Results  Component Value Date   WBC 12.4 (H) 03/23/2017   HGB 10.1 (L) 03/23/2017   HCT 32.7 (L) 03/23/2017   MCV 86.3 03/23/2017   PLT 228 03/23/2017    Lab Results  Component Value Date   CREATININE 0.75 03/23/2017   BUN 10 03/23/2017   NA 143 03/23/2017   K 3.5 03/23/2017   CL 110 03/23/2017   CO2 28 03/23/2017    Lab Results  Component Value Date   ALT 12 (L) 03/21/2017   AST 20 03/21/2017   ALKPHOS 47 03/21/2017   BILITOT 0.6 03/21/2017     Microbiology: Recent Results (from the past 240 hour(s))  Culture, blood (routine x 2)     Status: None   Collection Time: 03/17/17  3:05 PM  Result Value Ref Range Status   Specimen Description BLOOD LEFT ANTECUBITAL  Final   Special Requests   Final    BOTTLES DRAWN AEROBIC ONLY Blood Culture adequate volume   Culture NO GROWTH 5 DAYS  Final   Report Status 03/22/2017 FINAL  Final  Culture, blood (routine x 2)     Status: None   Collection Time: 03/17/17  3:11 PM  Result Value Ref Range Status   Specimen Description BLOOD LEFT ARM  Final   Special Requests   Final    BOTTLES DRAWN AEROBIC ONLY Blood Culture adequate volume   Culture NO GROWTH 5 DAYS  Final   Report Status 03/22/2017 FINAL  Final  MRSA PCR Screening     Status: None   Collection Time: 03/17/17 10:22 PM  Result Value Ref Range Status   MRSA by PCR NEGATIVE  NEGATIVE Final    Comment:        The GeneXpert MRSA Assay (FDA approved for NASAL specimens only), is one component of a comprehensive MRSA colonization surveillance program. It is not intended to diagnose MRSA infection nor to guide or monitor treatment for MRSA infections.   Surgical PCR screen     Status: None   Collection Time: 03/18/17 10:51 AM  Result Value Ref Range Status   MRSA, PCR NEGATIVE NEGATIVE Final   Staphylococcus aureus NEGATIVE NEGATIVE Final    Comment:        The Xpert SA Assay (FDA approved for NASAL specimens in patients over 26 years of age), is one component of a comprehensive surveillance program.  Test performance has been validated by [facility] for patients greater than or equal to 7 year old. It is not intended to diagnose infection nor to guide or monitor treatment.   Fungus Culture With Stain     Status: None (Preliminary result)   Collection Time: 03/18/17  3:04 PM  Result Value Ref Range Status   Fungus Stain Final report  Final    Comment: (NOTE) Performed At: [facility] [place], [place] [id number] Lindon Romp MD [id number]    Fungus (Mycology) Culture PENDING  Incomplete   Fungal Source ABSCESS  Final    Comment: SUBGALEAL  Aerobic/Anaerobic Culture (surgical/deep wound)     Status: None (Preliminary result)   Collection Time: 03/18/17  3:04 PM  Result Value Ref Range Status   Specimen Description ABSCESS SUBGALEAL  Final   Special Requests NONE  Final   Gram Stain   Final    ABUNDANT WBC PRESENT, PREDOMINANTLY PMN FEW GRAM POSITIVE COCCI IN PAIRS    Culture   Final    ABUNDANT VIRIDANS STREPTOCOCCUS SUSCEPTIBILITIES PERFORMED ON PREVIOUS CULTURE WITHIN THE LAST 5 DAYS. NO ANAEROBES ISOLATED; CULTURE IN PROGRESS FOR 5 DAYS    Report Status PENDING  Incomplete  Fungus Culture Result     Status: None   Collection Time: 03/18/17  3:04 PM  Result Value Ref Range Status   Result 1 Comment   Final    Comment: (NOTE) KOH/Calcofluor preparation:  no fungus observed. Performed At: [facility] [place], [place] [id number] Lindon Romp MD [id number]   Fungus Culture With Stain     Status: None (Preliminary result)   Collection Time: 03/18/17  3:05 PM  Result Value Ref Range Status   Fungus Stain Final report  Final  Comment: (NOTE) Performed At: [facility] [place], [place] [id number] Lindon Romp MD [id number]    Fungus (Mycology) Culture PENDING  Incomplete   Fungal Source ABSCESS  Final    Comment: SUBGALEAL SPECIMEN B  Aerobic/Anaerobic Culture (surgical/deep wound)     Status: None (Preliminary result)   Collection Time: 03/18/17  3:05 PM  Result Value Ref Range Status   Specimen Description ABSCESS  Final   Special Requests SUBGALEAL SPECIMEN B  Final   Gram Stain   Final    FEW WBC PRESENT,BOTH PMN AND MONONUCLEAR RARE GRAM POSITIVE COCCI IN PAIRS    Culture   Final    FEW VIRIDANS STREPTOCOCCUS SUSCEPTIBILITIES PERFORMED ON PREVIOUS CULTURE WITHIN THE LAST 5 DAYS. NO ANAEROBES ISOLATED; CULTURE IN PROGRESS FOR 5 DAYS    Report Status PENDING  Incomplete  Fungus Culture Result     Status: None   Collection Time: 03/18/17  3:05 PM  Result Value Ref Range Status   Result 1 Comment  Final    Comment: (NOTE) KOH/Calcofluor preparation:  no fungus observed. Performed At: [facility] [place], [place] [id number] Lindon Romp MD [id number]   Fungus Culture With Stain     Status: None (Preliminary result)   Collection Time: 03/18/17  3:06 PM  Result Value Ref Range Status   Fungus Stain Final report  Final    Comment: (NOTE) Performed At: [facility] [place], [place] [id number] Lindon Romp MD [id number]    Fungus (Mycology) Culture PENDING  Incomplete   Fungal Source ABSCESS  Final    Comment: SUGALEAL PHLEGMON SPECIMEN C    Aerobic/Anaerobic Culture (surgical/deep wound)     Status: None (Preliminary result)   Collection Time: 03/18/17  3:06 PM  Result Value Ref Range Status   Specimen Description ABSCESS  Final   Special Requests SUBGALEAL PHLEGMON,SPECIMEN C  Final   Gram Stain   Final    FEW WBC PRESENT,BOTH PMN AND MONONUCLEAR NO ORGANISMS SEEN    Culture   Final    FEW VIRIDANS STREPTOCOCCUS CRITICAL RESULT CALLED TO, READ BACK BY AND VERIFIED WITH: J Makeya Hilgert,MD AT 1140 03/20/17 BY L BENFIELD CONCERNING GROWTH ON CULTURE NO ANAEROBES ISOLATED; CULTURE IN PROGRESS FOR 5 DAYS    Report Status PENDING  Incomplete   Organism ID, Bacteria VIRIDANS STREPTOCOCCUS  Final      Susceptibility   Viridans streptococcus - MIC*    ERYTHROMYCIN >=8 RESISTANT Resistant     LEVOFLOXACIN 1 SENSITIVE Sensitive     VANCOMYCIN 0.5 SENSITIVE Sensitive     * FEW VIRIDANS STREPTOCOCCUS  Fungus Culture Result     Status: None   Collection Time: 03/18/17  3:06 PM  Result Value Ref Range Status   Result 1 Comment  Final    Comment: (NOTE) KOH/Calcofluor preparation:  no fungus observed. Performed At: [facility] [place], [place] [id number] Lindon Romp MD [id number]   Fungus Culture With Stain     Status: None (Preliminary result)   Collection Time: 03/18/17  3:07 PM  Result Value Ref Range Status   Fungus Stain Final report  Final    Comment: (NOTE) Performed At: [facility] [place], [place] [id number] Lindon Romp MD [id number]    Fungus (Mycology) Culture PENDING  Incomplete   Fungal Source BONE  Final    Comment: FLAP  Aerobic/Anaerobic Culture (surgical/deep wound)     Status: None  Collection Time: 03/18/17  3:07 PM  Result Value Ref Range Status   Specimen Description BONE  Final   Special Requests BONE FLAP  Final   Gram Stain   Final    ABUNDANT WBC PRESENT,BOTH PMN AND MONONUCLEAR NO ORGANISMS SEEN    Culture   Final    FEW  VIRIDANS STREPTOCOCCUS CRITICAL RESULT CALLED TO, READ BACK BY AND VERIFIED WITH: J Lawerance Matsuo,MD AT 1140 03/20/17 BY L BENFIELD CONCERNING GROWTH ON CULTURE NO ANAEROBES ISOLATED    Report Status 03/23/2017 FINAL  Final   Organism ID, Bacteria VIRIDANS STREPTOCOCCUS  Final      Susceptibility   Viridans streptococcus - MIC*    ERYTHROMYCIN >=8 RESISTANT Resistant     LEVOFLOXACIN 1 SENSITIVE Sensitive     VANCOMYCIN 0.5 SENSITIVE Sensitive     * FEW VIRIDANS STREPTOCOCCUS  Fungus Culture Result     Status: None   Collection Time: 03/18/17  3:07 PM  Result Value Ref Range Status   Result 1 Comment  Final    Comment: (NOTE) KOH/Calcofluor preparation:  no fungus observed. Performed At: [facility] [address] [place], [place] [id number] Lindon Romp MD [id number]   Culture, respiratory (tracheal aspirate)     Status: None (Preliminary result)   Collection Time: 03/21/17  1:00 AM  Result Value Ref Range Status   Specimen Description TRACHEAL ASPIRATE  Final   Special Requests NONE  Final   Gram Stain FEW WBC PRESENT, PREDOMINANTLY PMN FEW YEAST   Final   Culture FEW YEAST  Final   Report Status PENDING  Incomplete    Michel Bickers, MD [facility] [phone] pager   [phone] cell 03/23/2017, 12:01 PM

## 2017-03-23 NOTE — Progress Notes (Signed)
[facility] Progress Note  5 Days Post-Op  Subjective: CC:  FC on vent. Upper abd pain started Wednesday, acutely with associated N/V. Last BM 8/16. Denies abdominal pain now. Having throat discomfort. pts son-in-law at bedside assisting with history.  NGT >1,600 cc/24h  On precedex at 74  Objective: Vital signs in last 24 hours: Temp:  [99 F (37.2 C)-99.5 F (37.5 C)] 99.5 F (37.5 C) (08/20 0400) Pulse Rate:  [87-132] 89 (08/20 0330) Resp:  [12-22] 19 (08/20 0330) BP: (70-171)/(48-98) 152/79 (08/20 0330) SpO2:  [92 %-97 %] 95 % (08/20 0330) FiO2 (%):  [40 %] 40 % (08/20 0313) Weight:  [73.2 kg (161 lb 6 oz)] 73.2 kg (161 lb 6 oz) (08/20 0330) Last BM Date: 03/19/17  Intake/Output from previous day: 08/19 0701 - 08/20 0700 In: 3587.6 [I.V.:2937.6; IV Piggyback:650] Out: 2270 [Urine:695; Emesis/NG output:1575] Intake/Output this shift: No intake/output data recorded.  PE: Gen:  somenolent, NAD, cooperative Card:  Regular rate and rhythm, pedal pulses 2+ BL Pulm: on vent; decreased breath sounds in bilateral lung bases Abd: Soft, non-tender, non-distended, bowel sounds present in all 4 quadrants Skin: warm and dry, no rashes  Psych: A&Ox3   Lab Results:   Recent Labs  03/22/17 0449 03/23/17 0339  WBC 13.2* 12.4*  HGB 10.6* 10.1*  HCT 33.3* 32.7*  PLT 225 228   BMET  Recent Labs  03/22/17 2005 03/23/17 0339  NA 142 143  K 3.7 3.5  CL 108 110  CO2 27 28  GLUCOSE 127* 117*  BUN 9 10  CREATININE 0.75 0.75  CALCIUM 8.0* 8.1*   PT/INR No results for input(s): LABPROT, INR in the last 72 hours. CMP     Component Value Date/Time   NA 143 03/23/2017 0339   K 3.5 03/23/2017 0339   CL 110 03/23/2017 0339   CO2 28 03/23/2017 0339   GLUCOSE 117 (H) 03/23/2017 0339   BUN 10 03/23/2017 0339   CREATININE 0.75 03/23/2017 0339   CALCIUM 8.1 (L) 03/23/2017 0339   PROT 5.1 (L) 03/21/2017 0107   ALBUMIN 2.6 (L) 03/21/2017 0107   AST 20  03/21/2017 0107   ALT 12 (L) 03/21/2017 0107   ALKPHOS 47 03/21/2017 0107   BILITOT 0.6 03/21/2017 0107   GFRNONAA >60 03/23/2017 0339   GFRAA >60 03/23/2017 0339   Lipase     Component Value Date/Time   LIPASE 25 03/21/2017 0107       Studies/Results: Ct Abdomen Pelvis Wo Contrast  Result Date: 03/21/2017 CLINICAL DATA:  Acute onset of hypoxic respiratory failure. Generalized abdominal pain, nausea and vomiting. Initial encounter. EXAM: CT CHEST, ABDOMEN AND PELVIS WITHOUT CONTRAST TECHNIQUE: Multidetector CT imaging of the chest, abdomen and pelvis was performed following the standard protocol without IV contrast. COMPARISON:  CT of the chest, abdomen and pelvis from 10/23/2016 FINDINGS: CT CHEST FINDINGS Cardiovascular: The heart is borderline normal in size. Scattered calcification is noted along the aortic arch and descending thoracic aorta. Scattered coronary artery calcifications are seen. A right IJ line is noted ending about the mid SVC. Mediastinum/Nodes: The patient's endotracheal tube is seen ending just above the carina. This could be retracted 2 cm. Visualized mediastinal nodes remain normal in size. No pericardial effusion is identified. 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 The patient's enteric tube is noted ending at the proximal intra-abdominal portion of the stomach. 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 No axillary lymphadenopathy is  appreciated. Postoperative change is noted at the right axilla. The visualized portions of the thyroid gland are unremarkable. Lungs/Pleura: Small bilateral pleural effusions are noted. There is partial consolidation of both lower lung lobes, concerning for pneumonia. Additional patchy airspace opacities are noted at the right upper lobe. No pneumothorax is seen. No mass is identified. Musculoskeletal: No acute osseous abnormalities are identified. The  visualized musculature is unremarkable in appearance. A right-sided breast implant is noted, status post right-sided mastectomy. CT ABDOMEN PELVIS FINDINGS Hepatobiliary: A large right hepatic cyst is noted, measuring 14.8 cm in size. Additional hepatic cysts are noted. Apparent vicarious contrast excretion or sludge is noted within the gallbladder. There is suggestion of a tiny stone in the gallbladder. The common bile duct remains normal in caliber. Pancreas: The pancreas is within normal limits. Spleen: The spleen is unremarkable in appearance. Adrenals/Urinary Tract: The adrenal glands are grossly unremarkable in appearance. Nonspecific left-sided perinephric stranding is noted, with fluid along Gerota's fascia on the left. There is mild asymmetric prominence of the left kidney. This could reflect left-sided pyelonephritis. Would correlate clinically. The right kidney is grossly unremarkable. No renal or ureteral stones are identified. There is no evidence of hydronephrosis. Stomach/Bowel: The stomach is unremarkable in appearance. The small bowel is within normal limits. The appendix is normal in caliber, without evidence of appendicitis. The colon is unremarkable in appearance. Vascular/Lymphatic: Scattered calcification is seen along the abdominal aorta and its branches. The abdominal aorta is otherwise grossly unremarkable. The inferior vena cava is grossly unremarkable. No retroperitoneal lymphadenopathy is seen. No pelvic sidewall lymphadenopathy is identified. Reproductive: The bladder is decompressed, with a Foley catheter place. The patient is status post hysterectomy. Trace free fluid is noted within the pelvis, and along the paracolic gutters. Other: No additional soft tissue abnormalities are seen. Musculoskeletal: No acute osseous abnormalities are identified. Subcortical cysts are noted at both hip joints. A vertebral body hemangioma is noted at L1. The visualized musculature is unremarkable in  appearance. IMPRESSION: 1. Very large paraesophageal hernia, diffusely distended with contrast. This is more distended than on prior studies. Enteric tube noted ending at the distended proximal intra-abdominal portion of the stomach. No contrast has progressed beyond the stomach; the chronic paraesophageal hernia may be causing some degree of gastric outlet obstruction. 2. Small bilateral pleural effusions. Partial consolidation of both lower lung lobes, concerning for pneumonia. Additional patchy airspace opacities at the right upper lung lobe. 3. Nonspecific left-sided perinephric stranding, with fluid along Gerota's fascia on the left. Mild asymmetric prominence of the left kidney. This could reflect left-sided pyelonephritis. Would correlate for associated symptoms. 4. Endotracheal tube noted ending just above the carina. This could be retracted 2 cm. 5. Suggestion of minimal cholelithiasis. Apparent vicarious contrast excretion or sludge within the gallbladder. Gallbladder otherwise grossly unremarkable in appearance. 6. Scattered aortic atherosclerosis. 7. Trace free fluid within the pelvis and along the paracolic gutters. 8. Scattered coronary artery calcifications seen. 9. Hepatic cysts, the largest of which measures 14.8 cm in size. These results were called by telephone at the time of interpretation on 03/21/2017 at 9:28 pm to [facility] on [facility], who verbally acknowledged these results. Electronically Signed   By: Garald Balding M.D.   On: 03/21/2017 21:30   Ct Chest Wo Contrast  Result Date: 03/21/2017 CLINICAL DATA:  Acute onset of hypoxic respiratory failure. Generalized abdominal pain, nausea and vomiting. Initial encounter. EXAM: CT CHEST, ABDOMEN AND PELVIS WITHOUT CONTRAST TECHNIQUE: Multidetector CT imaging of the chest, abdomen and  pelvis was performed following the standard protocol without IV contrast. COMPARISON:  CT of the chest, abdomen and pelvis from 10/23/2016 FINDINGS: CT CHEST  FINDINGS Cardiovascular: The heart is borderline normal in size. Scattered calcification is noted along the aortic arch and descending thoracic aorta. Scattered coronary artery calcifications are seen. A right IJ line is noted ending about the mid SVC. Mediastinum/Nodes: The patient's endotracheal tube is seen ending just above the carina. This could be retracted 2 cm. Visualized mediastinal nodes remain normal in size. No pericardial effusion is identified. 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 The patient's enteric tube is noted ending at the proximal intra-abdominal portion of the stomach. 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 No axillary lymphadenopathy is appreciated. Postoperative change is noted at the right axilla. The visualized portions of the thyroid gland are unremarkable. Lungs/Pleura: Small bilateral pleural effusions are noted. There is partial consolidation of both lower lung lobes, concerning for pneumonia. Additional patchy airspace opacities are noted at the right upper lobe. No pneumothorax is seen. No mass is identified. Musculoskeletal: No acute osseous abnormalities are identified. The visualized musculature is unremarkable in appearance. A right-sided breast implant is noted, status post right-sided mastectomy. CT ABDOMEN PELVIS FINDINGS Hepatobiliary: A large right hepatic cyst is noted, measuring 14.8 cm in size. Additional hepatic cysts are noted. Apparent vicarious contrast excretion or sludge is noted within the gallbladder. There is suggestion of a tiny stone in the gallbladder. The common bile duct remains normal in caliber. Pancreas: The pancreas is within normal limits. Spleen: The spleen is unremarkable in appearance. Adrenals/Urinary Tract: The adrenal glands are grossly unremarkable in appearance. Nonspecific left-sided perinephric stranding is noted, with fluid along Gerota's fascia on the  left. There is mild asymmetric prominence of the left kidney. This could reflect left-sided pyelonephritis. Would correlate clinically. The right kidney is grossly unremarkable. No renal or ureteral stones are identified. There is no evidence of hydronephrosis. Stomach/Bowel: The stomach is unremarkable in appearance. The small bowel is within normal limits. The appendix is normal in caliber, without evidence of appendicitis. The colon is unremarkable in appearance. Vascular/Lymphatic: Scattered calcification is seen along the abdominal aorta and its branches. The abdominal aorta is otherwise grossly unremarkable. The inferior vena cava is grossly unremarkable. No retroperitoneal lymphadenopathy is seen. No pelvic sidewall lymphadenopathy is identified. Reproductive: The bladder is decompressed, with a Foley catheter place. The patient is status post hysterectomy. Trace free fluid is noted within the pelvis, and along the paracolic gutters. Other: No additional soft tissue abnormalities are seen. Musculoskeletal: No acute osseous abnormalities are identified. Subcortical cysts are noted at both hip joints. A vertebral body hemangioma is noted at L1. The visualized musculature is unremarkable in appearance. IMPRESSION: 1. Very large paraesophageal hernia, diffusely distended with contrast. This is more distended than on prior studies. Enteric tube noted ending at the distended proximal intra-abdominal portion of the stomach. No contrast has progressed beyond the stomach; the chronic paraesophageal hernia may be causing some degree of gastric outlet obstruction. 2. Small bilateral pleural effusions. Partial consolidation of both lower lung lobes, concerning for pneumonia. Additional patchy airspace opacities at the right upper lung lobe. 3. Nonspecific left-sided perinephric stranding, with fluid along Gerota's fascia on the left. Mild asymmetric prominence of the left kidney. This could reflect left-sided  pyelonephritis. Would correlate for associated symptoms. 4. Endotracheal tube noted ending just above the carina. This could be retracted 2 cm. 5. Suggestion of  minimal cholelithiasis. Apparent vicarious contrast excretion or sludge within the gallbladder. Gallbladder otherwise grossly unremarkable in appearance. 6. Scattered aortic atherosclerosis. 7. Trace free fluid within the pelvis and along the paracolic gutters. 8. Scattered coronary artery calcifications seen. 9. Hepatic cysts, the largest of which measures 14.8 cm in size. These results were called by telephone at the time of interpretation on 03/21/2017 at 9:28 pm to [facility] on [facility], who verbally acknowledged these results. Electronically Signed   By: Garald Balding M.D.   On: 03/21/2017 21:30   Dg Chest Port 1 View  Result Date: 03/23/2017 CLINICAL DATA:  Respiratory failure, history of hypertension and breast malignancy. History of craniotomy for infected brain flap endplate. EXAM: PORTABLE CHEST 1 VIEW COMPARISON:  Portable chest x-ray of March 22, 2017 FINDINGS: The lungs are reasonably well inflated. Increased density at both lung bases persists and is slightly more conspicuous. Small bilateral pleural effusions are suspected. Bibasilar atelectasis or pneumonia is likely present. The heart is top-normal in size. The pulmonary vascularity is mildly prominent centrally. The endotracheal tube tip lies approximately 3 cm above the carina. The esophagogastric tube tip in proximal port project in the gastric cardia region. The right internal jugular venous catheter tip projects over the midportion of the SVC. IMPRESSION: Slight interval increase in bibasilar density consistent with pleural effusions with atelectasis or pneumonia. Probable low-grade CHF. The support tubes are in reasonable position. Electronically Signed   By: David  Martinique M.D.   On: 03/23/2017 07:30   Dg Chest Port 1 View  Result Date: 03/22/2017 CLINICAL DATA:  Followup for  respiratory failure. EXAM: PORTABLE CHEST 1 VIEW COMPARISON:  03/20/2017 FINDINGS: Lung base opacities have mildly increased from the prior studies. The upper lungs remain clear. The endotracheal tube, nasal/orogastric tube and right internal jugular central venous line are stable. IMPRESSION: 1. Mild worsening in lung aeration with an increase in lung base opacity, likely due to an increase in pleural fluid and atelectasis and/or pneumonia. Electronically Signed   By: Lajean Manes M.D.   On: 03/22/2017 07:34    Anti-infectives: Anti-infectives    Start     Dose/Rate Route Frequency Ordered Stop   03/19/17 1200  meropenem (MERREM) 2 g in sodium chloride 0.9 % 100 mL IVPB     2 g 200 mL/hr over 30 Minutes Intravenous Every 8 hours 03/19/17 1109     03/19/17 1200  vancomycin (VANCOCIN) IVPB 750 mg/150 ml premix  Status:  Discontinued     750 mg 150 mL/hr over 60 Minutes Intravenous Every 12 hours 03/19/17 1109 03/20/17 1351   03/18/17 1532  bacitracin 50,000 Units in sodium chloride irrigation 0.9 % 500 mL irrigation  Status:  Discontinued       As needed 03/18/17 1533 03/18/17 1555   03/18/17 1530  meropenem (MERREM) 1 g in sodium chloride 0.9 % 100 mL IVPB     1 g 200 mL/hr over 30 Minutes Intravenous To Surgery 03/18/17 1516 03/18/17 1544   03/18/17 1524  vancomycin (VANCOCIN) powder  Status:  Discontinued       As needed 03/18/17 1524 03/18/17 1555   03/18/17 1500  ceFEPIme (MAXIPIME) 2 g in dextrose 5 % 50 mL IVPB  Status:  Discontinued     2 g 100 mL/hr over 30 Minutes Intravenous To Surgery 03/18/17 1459 03/18/17 1512   03/18/17 1428  vancomycin (VANCOCIN) 1-5 GM/200ML-% IVPB    Comments:  Abbie Sons, Sarah   : cabinet override  03/18/17 1428 03/19/17 0229     Assessment/Plan 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 Cyndy Freeze  Sepsis 2/2 post-operative subgaleal abscess s/p left frontal cranial wound exploration and removal bone 8/15 Aspiration pneumonia Acute hypoxic respiratory  failure   Nausea/vomiting Large hiatal hernia with likely gastric outlet obstruction - CT abd 8/18, paraesophageal hernia with oral contrast no progressing past stomach. - NG tube 1,675 cc/24h - no bowel function - Concerned patient will require surgical intervention this admission to relieve obstruction above. Will discuss surgical plan with MD.   FEN: NPO, IVF ID:  WBC 12.4; Merrem 8/16 >> VTE: SCD's, SQ heparin Foley: In place, UOP 820 cc/24h   LOS: 6 days    Jill Alexanders , [facility] 03/23/2017, 7:47 AM Pager: [phone] Consults: [phone] Mon-Fri 7:00 am-4:30 pm Sat-Sun 7:00 am-11:30 am

## 2017-03-23 NOTE — Progress Notes (Addendum)
Asked to evaluate patient for extubation.  Dropped to 0/0 and took in 1200 ml Tv with sats stable at 30% FiO2.  CXR reviewed, ETT ok and no major acute disease noted.  Neurologically completely intact, alert and writing notes.  Will extubate.  IS per RT protocol.  Titrate O2 for sat of 88-92%.  SLP ordered.  PT evaluation ordered.  The patient is critically ill with multiple organ systems failure and requires high complexity decision making for assessment and support, frequent evaluation and titration of therapies, application of advanced monitoring technologies and extensive interpretation of multiple databases.   Critical Care Time devoted to patient care services described in this note is  35  Minutes. This time reflects time of care of this signee Dr Jennet Maduro. This critical care time does not reflect procedure time, or teaching time or supervisory time of PA/NP/Med student/Med Resident etc but could involve care discussion time.  Rush Farmer, M.D. [facility] Pulmonary/Critical Care Medicine. Pager: [phone]. After hours pager: [phone].

## 2017-03-23 NOTE — Progress Notes (Signed)
Reason for consult: 03/21/17 Acute hypoxic respiratory failure due to aspiration   In summary: 71 yo F with Hx of hiatal hernia; who had craniotomy on 3/26 for brain abscess. Presented on 8/14 with signs of sepsis and increased drainage from her wound found to have subgaleal abscess. Taken to the OR on 8/15 for Lt frontal cranial wound exploration and removal of bone. Flap. Hospital course complicated by nausea, vomiting and diarrhea. Patient had increased O2 requirement for which we been asked to see the patient.  MRI 8/14 with no evidence of recurrent abscess. New small fluid collectin underlyuing the left frontal scalp wound.  CT ABD 8/18 with very large paraesophageal hernia , large hepatic cyst    Subjective:  OGT in place for decompression Tolerating PSV currently   Vitals:   03/23/17 0700 03/23/17 0730 03/23/17 0745 03/23/17 0800  BP: 107/66 116/69 116/69 127/71  Pulse: 88 87 87 87  Resp: 18 17 (!) 22 (!) 23  Temp:    99.6 F (37.6 C)  TempSrc:    Axillary  SpO2: 94% 94% 94% 94%  Weight:      Height:        GEN : Ill-appearing woman, currently tolerating pressure support HEENT : ET tube in place, OG tube in place PULM: coarse B BS, no wheeze CARD : distant, no M ABD : distended, hypoactive BS Skin : Warm /no rash    A/P: 1. Acute hypoxic respiratory failure 2. Possible Aspiration pneumonia / pneumonitis  3. Sepsis 4. Encephalopathy, improved 5. 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 Subgaleal abscess  7. Hypokalemia, improved   Plan: - Discussed case with Dr. Donne Hazel, appreciate assistance. Needs to continue gastric tube suction and decompression. Hopefully gastric outlet obstruction will reduce/be relieved. If not then surgical intervention may be required during this hospitalization. Goal would be to defer until she has recovered from her original illness - Change OGT over 2 in GT to allow continued  intermittent suction - Plan to extubate based on her current performance on pressure support. - Wean Precedex, goal to off - Continue current imipenem - PPI, Reglan - Subcutaneous heparin - Follow BMP, urine output, replace electrolytes as indicated  Independent CC time 35 minutes   Baltazar Apo, MD, PhD 03/23/2017, 9:46 AM [place] [facility] [phone] or if no answer [phone]

## 2017-03-23 NOTE — Progress Notes (Signed)
Pt seen and examined. Recently extubated.  Feeling better now.  EXAM: Temp:  [98.7 F (37.1 C)-100.5 F (38.1 C)] 98.7 F (37.1 C) (08/20 1600) Pulse Rate:  [87-123] 123 (08/20 1700) Resp:  [15-24] 23 (08/20 1700) BP: (87-152)/(56-113) 144/86 (08/20 1700) SpO2:  [91 %-99 %] 91 % (08/20 1700) FiO2 (%):  [40 %] 40 % (08/20 1208) Weight:  [73.2 kg (161 lb 6 oz)] 73.2 kg (161 lb 6 oz) (08/20 0330) Intake/Output      08/19 0701 - 08/20 0700 08/20 0701 - 08/21 0700   I.V. (mL/kg) 3578.4 (48.9) 312.5 (4.3)   IV Piggyback 650    Total Intake(mL/kg) 4228.4 (57.8) 312.5 (4.3)   Urine (mL/kg/hr) 820 (0.5)    Emesis/NG output 1675    Total Output 2495     Net +1733.4 +312.5         Awake, alert, oriented Moves arms and legs well Bandage c/d/i  LABS: Lab Results  Component Value Date   CREATININE 0.75 03/23/2017   BUN 10 03/23/2017   NA 143 03/23/2017   K 3.5 03/23/2017   CL 110 03/23/2017   CO2 28 03/23/2017   Lab Results  Component Value Date   WBC 12.4 (H) 03/23/2017   HGB 10.1 (L) 03/23/2017   HCT 32.7 (L) 03/23/2017   MCV 86.3 03/23/2017   PLT 228 03/23/2017    IMPRESSION: - 71 y.o. 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  PLAN: - No active neurosurgical issues - Medical management per PCCM; gastric issues per surgery

## 2017-03-23 NOTE — Procedures (Signed)
Extubation Procedure Note  Patient Details:   Name: PHYLISHA DIX DOB: [birth year] MRN: [id number]   Airway Documentation:     Evaluation  O2 sats: stable throughout Complications: No apparent complications Patient did tolerate procedure well. Bilateral Breath Sounds: Rhonchi   Yes  Cuff leak with volume loss 4lmin [place] placed Incentive spirometer instructed and achieved 1221ml's  Revonda Standard 03/23/2017, 2:07 PM

## 2017-03-23 NOTE — Progress Notes (Signed)
eLink Physician-Brief Progress Note Patient Name: IDY RAWLING DOB: 1945/09/04 MRN: [id number]   Date of Service  03/23/2017  HPI/Events of Note  Dry eyes - saline drops   eICU Interventions  Slight temp rise Repeat in 1 hour, if fever can have tylenal again May need IV tylenal If spokes greater 101.5 may add vanc, UA     Intervention Category Minor Interventions: Routine modifications to care plan (e.g. PRN medications for pain, fever)  Raylene Miyamoto. 03/23/2017, 8:59 PM

## 2017-03-24 ENCOUNTER — Encounter (HOSPITAL_COMMUNITY): Payer: Self-pay | Admitting: Gastroenterology

## 2017-03-24 ENCOUNTER — Inpatient Hospital Stay (HOSPITAL_COMMUNITY): Payer: Medicare Other

## 2017-03-24 ENCOUNTER — Other Ambulatory Visit: Payer: Self-pay

## 2017-03-24 LAB — BASIC METABOLIC PANEL
Anion gap: 8 (ref 5–15)
Anion gap: 8 (ref 5–15)
BUN: 9 mg/dL (ref 6–20)
BUN: 6 mg/dL (ref 6–20)
CALCIUM: 8 mg/dL — AB (ref 8.9–10.3)
CHLORIDE: 108 mmol/L (ref 101–111)
CHLORIDE: 110 mmol/L (ref 101–111)
CO2: 28 mmol/L (ref 22–32)
CO2: 26 mmol/L (ref 22–32)
CREATININE: 0.79 mg/dL (ref 0.44–1.00)
Calcium: 8.1 mg/dL — ABNORMAL LOW (ref 8.9–10.3)
Creatinine, Ser: 0.66 mg/dL (ref 0.44–1.00)
GFR calc Af Amer: >60 mL/min (ref >60)
GFR calc non Af Amer: >60 mL/min (ref >60)
Glucose, Bld: 87 mg/dL (ref 65–99)
Glucose, Bld: 137 mg/dL — ABNORMAL HIGH (ref 65–99)
POTASSIUM: 3 mmol/L — AB (ref 3.5–5.1)
Potassium: 2.6 mmol/L — CL (ref 3.5–5.1)
SODIUM: 144 mmol/L (ref 135–145)
SODIUM: 144 mmol/L (ref 135–145)

## 2017-03-24 LAB — CBC
HEMATOCRIT: 31 % — AB (ref 36.0–46.0)
HEMOGLOBIN: 9.7 g/dL — AB (ref 12.0–15.0)
MCH: 26.9 pg (ref 26.0–34.0)
MCHC: 31.3 g/dL (ref 30.0–36.0)
MCV: 85.9 fL (ref 78.0–100.0)
Platelets: 224 10*3/uL (ref 150–400)
RBC: 3.61 MIL/uL — AB (ref 3.87–5.11)
RDW: 13.6 % (ref 11.5–15.5)
WBC: 11.2 10*3/uL — ABNORMAL HIGH (ref 4.0–10.5)

## 2017-03-24 LAB — AEROBIC/ANAEROBIC CULTURE W GRAM STAIN (SURGICAL/DEEP WOUND)

## 2017-03-24 LAB — PREALBUMIN: PREALBUMIN: 5 mg/dL — AB (ref 18–38)

## 2017-03-24 LAB — AEROBIC/ANAEROBIC CULTURE (SURGICAL/DEEP WOUND)

## 2017-03-24 LAB — TROPONIN I
TROPONIN I: 0.09 ng/mL — AB (ref <0.03)
Troponin I: 0.12 ng/mL (ref <0.03)

## 2017-03-24 LAB — MAGNESIUM: Magnesium: 2.1 mg/dL (ref 1.7–2.4)

## 2017-03-24 LAB — GLUCOSE, CAPILLARY: Glucose-Capillary: 125 mg/dL — ABNORMAL HIGH (ref 65–99)

## 2017-03-24 MED ORDER — SODIUM CHLORIDE 0.9 % IV SOLN
INTRAVENOUS | Status: DC
  Administered 2017-03-24: 18:00:00 via INTRAVENOUS

## 2017-03-24 MED ORDER — MORPHINE SULFATE (PF) 4 MG/ML IV SOLN
2.0000 mg | INTRAVENOUS | Status: DC | PRN
  Administered 2017-03-24 – 2017-03-27 (×12): 2 mg via INTRAVENOUS
  Filled 2017-03-24 (×13): qty 1

## 2017-03-24 MED ORDER — POTASSIUM CHLORIDE 10 MEQ/50ML IV SOLN
10.0000 meq | INTRAVENOUS | Status: AC
  Administered 2017-03-24 – 2017-03-25 (×5): 10 meq via INTRAVENOUS
  Filled 2017-03-24 (×5): qty 50

## 2017-03-24 MED ORDER — TRACE MINERALS CR-CU-MN-SE-ZN 10-1000-500-60 MCG/ML IV SOLN
INTRAVENOUS | Status: AC
  Administered 2017-03-24: 18:00:00 via INTRAVENOUS
  Filled 2017-03-24: qty 960

## 2017-03-24 MED ORDER — HYDRALAZINE HCL 20 MG/ML IJ SOLN
10.0000 mg | Freq: Four times a day (QID) | INTRAMUSCULAR | Status: DC | PRN
  Administered 2017-03-24 (×2): 20 mg via INTRAVENOUS
  Filled 2017-03-24 (×2): qty 1

## 2017-03-24 MED ORDER — INSULIN ASPART 100 UNIT/ML ~~LOC~~ SOLN
0.0000 [IU] | SUBCUTANEOUS | Status: DC
  Administered 2017-03-24 – 2017-03-25 (×5): 1 [IU] via SUBCUTANEOUS
  Administered 2017-03-26: 2 [IU] via SUBCUTANEOUS
  Administered 2017-03-26: 1 [IU] via SUBCUTANEOUS
  Administered 2017-03-26 (×2): 2 [IU] via SUBCUTANEOUS
  Administered 2017-03-27 (×4): 1 [IU] via SUBCUTANEOUS

## 2017-03-24 MED ORDER — METOPROLOL TARTRATE 5 MG/5ML IV SOLN
5.0000 mg | Freq: Once | INTRAVENOUS | Status: AC
  Administered 2017-03-24: 5 mg via INTRAVENOUS
  Filled 2017-03-24: qty 5

## 2017-03-24 MED ORDER — POTASSIUM CHLORIDE 10 MEQ/50ML IV SOLN
10.0000 meq | INTRAVENOUS | Status: AC
  Administered 2017-03-24 (×10): 10 meq via INTRAVENOUS
  Filled 2017-03-24 (×9): qty 50

## 2017-03-24 MED ORDER — METOPROLOL SUCCINATE ER 50 MG PO TB24: 50.0000 mg | ORAL_TABLET | Freq: Every day | ORAL | Status: DC

## 2017-03-24 MED ORDER — SODIUM CHLORIDE 0.9 % IV BOLUS (SEPSIS)
500.0000 mL | Freq: Once | INTRAVENOUS | Status: AC
  Administered 2017-03-24: 500 mL via INTRAVENOUS

## 2017-03-24 NOTE — Evaluation (Signed)
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 Pt underwent another  Lcrani for osteomyeleitis and infection. Pt also with large abdominal hernia she is getting worked up for.  Clinical Impression  Pt admitted with above. Pt was indep and living alone PTA but now requiring mina for all mobility however suspect pt to progress quickly and be able to d/c home with 24/7 assist. Acute PT to con't to follow.     Follow Up Recommendations Home health PT;Supervision/Assistance - 24 hour (may progress and not need HHPT)    Equipment Recommendations  Rolling walker with 5" wheels (may progress and not need)    Recommendations for Other Services       Precautions / Restrictions Precautions Precautions: Fall Restrictions Weight Bearing Restrictions: No      Mobility  Bed Mobility Overal bed mobility: Needs Assistance Bed Mobility: Supine to Sit     Supine to sit: Min assist     General bed mobility comments: pt initiated transfer minA to scoot to EOB due to air mattress  Transfers Overall transfer level: Needs assistance Equipment used: 1 person hand held assist Transfers: Sit to/from Stand Sit to Stand: Min assist         General transfer comment: minA to steady pt and manage lines  Ambulation/Gait Ambulation/Gait assistance: Min assist;+2 physical assistance;+2 safety/equipment Ambulation Distance (Feet): 50 Feet Assistive device: 2 person hand held assist Gait Pattern/deviations: Step-through pattern;Decreased stride length Gait velocity: slow Gait velocity interpretation: Below normal speed for age/gender General Gait Details: pt guarded and cautious, mildly unsteady, will trial RW next session. HR into 140s and SpO2 88-93% on 6LO2 via  [place]  Stairs            Wheelchair Mobility    Modified Rankin (Stroke Patients Only)       Balance Overall balance assessment: Needs assistance Sitting-balance support: Feet supported;No upper extremity supported Sitting balance-Leahy Scale: Good     Standing balance support: No upper extremity supported Standing balance-Leahy Scale: Fair                               Pertinent Vitals/Pain Pain Assessment: No/denies pain    Home Living Family/patient expects to be discharged to:: Private residence Living Arrangements: Alone Available Help at Discharge: Family;Friend(s);Available 24 hours/day Type of Home: House Home Access: Stairs to enter Entrance Stairs-Rails: None Entrance Stairs-Number of Steps: 1   Home Equipment: Shower seat - built in      Prior Function Level of Independence: Independent         Comments: drives, teaches [employer]     Hand Dominance   Dominant Hand: Right    Extremity/Trunk Assessment   Upper Extremity Assessment Upper Extremity Assessment: Overall WFL for tasks assessed    Lower Extremity Assessment Lower Extremity Assessment: Overall WFL for tasks assessed    Cervical / Trunk Assessment Cervical / Trunk Assessment: Other exceptions Cervical / Trunk Exceptions: s/p L crani  Communication   Communication: No difficulties  Cognition Arousal/Alertness: Awake/alert Behavior During Therapy: WFL for tasks assessed/performed Overall Cognitive Status: Within Functional Limits for tasks assessed  General Comments General comments (skin integrity, edema, etc.): dressing intact    Exercises     Assessment/Plan    PT Assessment Patient needs continued PT services  PT Problem List Decreased range of motion;Decreased activity tolerance;Decreased balance;Decreased mobility       PT Treatment Interventions DME instruction;Gait training;Stair  training;Functional mobility training;Therapeutic activities;Therapeutic exercise;Balance training    PT Goals (Current goals can be found in the Care Plan section)  Acute Rehab PT Goals Patient Stated Goal: home PT Goal Formulation: With patient Time For Goal Achievement: 03/31/17 Potential to Achieve Goals: Good    Frequency Min 3X/week   Barriers to discharge        Co-evaluation               AM-PAC PT "6 Clicks" Daily Activity  Outcome Measure Difficulty turning over in bed (including adjusting bedclothes, sheets and blankets)?: A Little Difficulty moving from lying on back to sitting on the side of the bed? : A Little Difficulty sitting down on and standing up from a chair with arms (e.g., wheelchair, bedside commode, etc,.)?: A Little Help needed moving to and from a bed to chair (including a wheelchair)?: A Little Help needed walking in hospital room?: A Lot Help needed climbing 3-5 steps with a railing? : A Lot 6 Click Score: 16    End of Session Equipment Utilized During Treatment: Gait belt;Oxygen (6Lo2 via [place]) Activity Tolerance: Patient tolerated treatment well Patient left: in chair;with call bell/phone within reach;with family/visitor present Nurse Communication: Mobility status PT Visit Diagnosis: Unsteadiness on feet (R26.81)    Time: 8938-1017 PT Time Calculation (min) (ACUTE ONLY): 20 min   Charges:   PT Evaluation $PT Eval Moderate Complexity: 1 Mod     PT G CodesKittie [name], PT, DPT Pager #: [phone] Office #: [phone]   Guadalupe Guerra 03/24/2017, 11:35 AM

## 2017-03-24 NOTE — Progress Notes (Signed)
[facility] Progress Note Patient Name: Alexandria [name] DOB: 07-Dec-1945 MRN: [id number]   Date of Service  03/24/2017  HPI/Events of Note  Low K  supp IV  eICU Interventions       Intervention Category Major Interventions: Electrolyte abnormality - evaluation and management  Raylene Miyamoto. 03/24/2017, 10:44 PM

## 2017-03-24 NOTE — Care Management Note (Signed)
Case Management Note  Patient Details  Name: Alexandria [name] MRN: [id number] Date of Birth: [birth year]  Subjective/Objective:   Pt admitted on 03/17/17 with surgical site infection s/p craniotomy on 10/27/16. PTA, pt independent, lives at home alone.  She has supportive son and daughter in law at bedside.  Pt with hx of brain abscess with long term IV antibiotics provided by [facility].                  Action/Plan: Met with pt and family to discuss dc planning.  Pt will require additional home IV antibiotics upon dc, and pt/family prefer to use [facility] again for [facility] needs.  Referral to [facility] IV infusion coordinator, Carolynn Sayers, to facilitate IV antibiotics at dc.  Will need orders for [facility] for home IV antibiotics and PICC line care.  Will follow progress.    Expected Discharge Date:   (Pending)               Expected Discharge Plan:  [place]  In-House Referral:     Discharge planning Services  CM Consult  Post Acute Care Choice:  Home Health, Resumption of Svcs/PTA Provider Choice offered to:  Patient  DME Arranged:  IV pump/equipment DME Agency:     HH Arranged:  RN [place] Agency:  [facility]  Status of Service:  In process, will continue to follow  If discussed at Long Length of Stay Meetings, dates discussed:    Additional Comments:  03/24/17 J. Telisha Zawadzki, RN, [facility] course complicated by acute respiratory failure due to aspiration over the weekend.  Pt extubated on 03/23/17; still has NG tube to intermittent suction.  GI planning endoscopy on 03/25/17 for further evaluation of paraesophageal hernia.  Will follow.     Reinaldo Raddle, RN, BSN  Trauma/Neuro ICU Case Manager [phone]

## 2017-03-24 NOTE — Progress Notes (Signed)
Pt seen and examined. No issues overnight.  EXAM: Temp:  [98.7 F (37.1 C)-101.1 F (38.4 C)] 99.2 F (37.3 C) (08/21 0402) Pulse Rate:  [90-133] 122 (08/21 1100) Resp:  [14-34] 29 (08/21 1100) BP: (117-189)/(68-141) 184/141 (08/21 1100) SpO2:  [89 %-100 %] 96 % (08/21 1100) FiO2 (%):  [40 %] 40 % (08/20 1208) Weight:  [73.4 kg (161 lb 13.1 oz)] 73.4 kg (161 lb 13.1 oz) (08/21 0300) Intake/Output      08/20 0701 - 08/21 0700 08/21 0701 - 08/22 0700   I.V. (mL/kg) 1267.1 (17.3) 250 (3.4)   IV Piggyback 500 100   Total Intake(mL/kg) 1767.1 (24.1) 350 (4.8)   Urine (mL/kg/hr) 925 (0.5) 275 (0.8)   Emesis/NG output 1000    Total Output 1925 275   Net -157.9 +75        Urine Occurrence 3 x    Stool Occurrence 1 x     Awake and alert Follows commands throughout Full strength Bandage c/d/i  Stable GI issues per Surgery and GI medicine

## 2017-03-24 NOTE — Progress Notes (Signed)
PHARMACY - ADULT TOTAL PARENTERAL NUTRITION CONSULT NOTE   Pharmacy Consult for TPN Indication: Intolerance of enteral feeding  Patient Measurements: Height: 5' 1.5" (156.2 cm) Weight: 161 lb 13.1 oz (73.4 kg) IBW/kg (Calculated) : 48.95 TPN AdjBW (KG): 54.4 Body mass index is 30.08 kg/m.   Assessment:  71yo female admitted 8/14 s/p outpt clinic wound check with swelling at craniotomy site; pt s/p craniotomy 10/27/16 for brain abscess, MRI in July with no recurrent abscess.   She returned to OR on 8/15 for cranial wound exploration and removal of bone flap, and has since been NPO with N/V/diarrhea.  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   Clear liquids were ordered 8/15 and then advanced to Regular diet on 8/16, which pt did not tolerate due to abdominal pain and repeated nausea/vomiting and was subsequently made NPO on 8/17.  Pt has had little to no intake x 7 days and is to start TPN.  GI:  GOO.  Upper endoscopy today.  Considering G-tube placement eventually, timing yet to be determined.  NG tube with 1040mL out (improved from 1678mL).  PPI-IV Endo:  No hx DM, on no SSI Insulin requirements in the past 24 hours:  Lytes: K 2.6- K runs x 10 per MD, Mg 2.1 Renal:  Cr < 1.  UOP 0.5 mL/kg/hr.  NS at 50 ml/hr Pulm:  Violet-4L (extubated 8/20).  Albuterol nebs, mucomyst nebs x 72hr (8/20 >> ) Cards:   BP high, tachy. Hepatobil:  LFTs wnl on 8/18 Neuro:  Morphine IV prn severe pain ID:  Meropenem for Viridans strep wound infection.  WBC 11.2, Tm 101.1  Best Practices: mouth care, heparin SQ TPN Access:  CVL- double lumen (8/16) TPN start date: 8/21  Nutritional Goals (per RD recommendation on 8/18): RD note:  Eating well pta but 9% wt loss in 4 mo KCal:  1293 kcal, will go with goal 1250-1350 kcal  Protein:  100-110g  Current Nutrition:   Plan:   Clinimix 5/15-E at 55ml/hr Add MVI and Trace Elements to tpn No Lipid emulsion x 7 days in ICU pt per ASPEN guidelines (day #1/7 8/21) Sensitive SSI q4 CMet, Mg, Phos in AM TPN labs qMon/Thurs F/U plans for G-tube and ability to transition to tube feedings   Lewie Chamber., PharmD [facility]

## 2017-03-24 NOTE — Progress Notes (Signed)
Patient ID: Alexandria [name], female   DOB: 04-Nov-1945, 71 y.o.   MRN: [id number]          [facility] for Infectious Disease  Date of Admission:  03/17/2017           Day 6 meropenem ASSESSMENT: She has developed some low-grade fever. She probably had some aspiration of gastric contents several days ago which puts her at risk for developing superimposed bacterial pneumonia. I will not broaden antibiotic therapy at this time.  PLAN: 1. Continue meropenem 2. 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 acetylcysteine  4 mL Nebulization QID  . albuterol  2.5 mg Nebulization QID  . chlorhexidine  15 mL Mouth Rinse BID  . Chlorhexidine Gluconate Cloth  6 each Topical Daily  . heparin  5,000 Units Subcutaneous Q8H  . insulin aspart  0-9 Units Subcutaneous Q4H  . mouth rinse  15 mL Mouth Rinse q12n4p  . pantoprazole (PROTONIX) IV  40 mg Intravenous Daily    SUBJECTIVE: She was extubated successfully yesterday and is feeling better today. She has some mild dry cough but feels like it has improved since yesterday. She became tachycardic when up walking in the hall just recently. She did not have any problems with shortness of breath or hypoxia. She's had 2 small, soft bowel movements today. She is tolerating her NG tube.  Review of Systems: Review of Systems  Constitutional: Positive for fever. Negative for chills and diaphoresis.  Respiratory: Positive for cough. Negative for sputum production and shortness of breath.   Cardiovascular: Negative for chest pain.  Gastrointestinal: Negative for abdominal pain, diarrhea, nausea and vomiting.  Neurological: Negative for  headaches.    Allergies  Allergen Reactions  . Wasp Venom Anaphylaxis  . Ceftriaxone Rash    Pt developed truncal rash on Ceftriaxone, flagyl and also possibly still on keppra. It persisted for weeks with scarring. She did complete 2-3 days of abx while rash still going on. Dermatologist diagnosed as drug rash  . Keppra [Levetiracetam] Rash    Possible drug rash see other notes  . Flagyl [Metronidazole] Rash    Possible drug rash see other notes  . Sulfa Antibiotics Hives    OBJECTIVE: Vitals:   03/24/17 1000 03/24/17 1100 03/24/17 1200 03/24/17 1300  BP: (!) 189/74 (!) 184/141  136/72  Pulse: (!) 128 (!) 122  (!) 140  Resp: (!) 34 (!) 29  (!) 27  Temp:   99 F (37.2 C)   TempSrc:   Oral   SpO2: (!) 89% 96%  92%  Weight:      Height:       Body mass index is 30.08 kg/m.  Physical Exam  Constitutional: She is oriented to person, place, and time.  She is in good spirits.  HENT:  The dressing over her left frontal incision is clean and dry.  Cardiovascular: Regular rhythm.   No murmur heard. Heart rate is around 140.  Pulmonary/Chest: Effort normal. She has no wheezes.  Bibasilar crackles posteriorly.  Abdominal: Soft. There is no tenderness.  Neurological: She is alert and oriented to person, place, and time.  Skin: No rash noted.  Psychiatric: Mood and affect normal.    Lab Results Lab Results  Component Value Date   WBC 11.2 (H) 03/24/2017   HGB 9.7 (L) 03/24/2017   HCT 31.0 (L) 03/24/2017   MCV 85.9 03/24/2017   PLT 224 03/24/2017    Lab Results  Component Value Date   CREATININE 0.79 03/24/2017   BUN 9 03/24/2017   NA 144 03/24/2017   K 2.6 (LL) 03/24/2017   CL 108 03/24/2017   CO2 28 03/24/2017    Lab Results  Component Value Date   ALT 12 (L) 03/21/2017   AST 20 03/21/2017   ALKPHOS 47 03/21/2017   BILITOT 0.6 03/21/2017     Microbiology: Recent Results (from the past 240 hour(s))  Culture, blood (routine x 2)     Status: None    Collection Time: 03/17/17  3:05 PM  Result Value Ref Range Status   Specimen Description BLOOD LEFT ANTECUBITAL  Final   Special Requests   Final    BOTTLES DRAWN AEROBIC ONLY Blood Culture adequate volume   Culture NO GROWTH 5 DAYS  Final   Report Status 03/22/2017 FINAL  Final  Culture, blood (routine x 2)     Status: None   Collection Time: 03/17/17  3:11 PM  Result Value Ref Range Status   Specimen Description BLOOD LEFT ARM  Final   Special Requests   Final    BOTTLES DRAWN AEROBIC ONLY Blood Culture adequate volume   Culture NO GROWTH 5 DAYS  Final   Report Status 03/22/2017 FINAL  Final  MRSA PCR Screening     Status: None   Collection Time: 03/17/17 10:22 PM  Result Value Ref Range Status   MRSA by PCR NEGATIVE NEGATIVE Final    Comment:        The GeneXpert MRSA Assay (FDA approved for NASAL specimens only), is one component of a comprehensive MRSA colonization surveillance program. It is not intended to diagnose MRSA infection nor to guide or monitor treatment for MRSA infections.   Surgical PCR screen     Status: None   Collection Time: 03/18/17 10:51 AM  Result Value Ref Range Status   MRSA, PCR NEGATIVE NEGATIVE Final   Staphylococcus aureus NEGATIVE NEGATIVE Final    Comment:        The Xpert SA Assay (FDA approved for NASAL specimens in patients over 38 years of age), is one component of a comprehensive surveillance program.  Test performance has been validated by [facility] for patients greater than or equal to 100 year old. It is not intended to diagnose infection nor to guide or monitor treatment.   Fungus Culture With Stain     Status: None (Preliminary result)   Collection Time: 03/18/17  3:04 PM  Result Value Ref Range Status   Fungus Stain Final report  Final    Comment: (NOTE) Performed At: [facility] [place], [place] [id number] Lindon Romp MD OE:[id number]    Fungus (Mycology) Culture PENDING  Incomplete    Fungal Source ABSCESS  Final    Comment: SUBGALEAL  Aerobic/Anaerobic Culture (surgical/deep wound)     Status: None   Collection Time: 03/18/17  3:04 PM  Result Value Ref Range Status   Specimen Description ABSCESS SUBGALEAL  Final   Special Requests NONE  Final   Gram Stain   Final    ABUNDANT WBC PRESENT, PREDOMINANTLY PMN FEW GRAM POSITIVE COCCI IN PAIRS    Culture   Final  ABUNDANT VIRIDANS STREPTOCOCCUS SUSCEPTIBILITIES PERFORMED ON PREVIOUS CULTURE WITHIN THE LAST 5 DAYS. NO ANAEROBES ISOLATED    Report Status 03/24/2017 FINAL  Final  Fungus Culture Result     Status: None   Collection Time: 03/18/17  3:04 PM  Result Value Ref Range Status   Result 1 Comment  Final    Comment: (NOTE) KOH/Calcofluor preparation:  no fungus observed. Performed At: [facility] [place], [place] [id number] Lindon Romp MD WU:[id number]   Fungus Culture With Stain     Status: None (Preliminary result)   Collection Time: 03/18/17  3:05 PM  Result Value Ref Range Status   Fungus Stain Final report  Final    Comment: (NOTE) Performed At: [facility] [place], [place] [id number] Lindon Romp MD VH:[id number]    Fungus (Mycology) Culture PENDING  Incomplete   Fungal Source ABSCESS  Final    Comment: SUBGALEAL SPECIMEN B  Aerobic/Anaerobic Culture (surgical/deep wound)     Status: None   Collection Time: 03/18/17  3:05 PM  Result Value Ref Range Status   Specimen Description ABSCESS  Final   Special Requests SUBGALEAL SPECIMEN B  Final   Gram Stain   Final    FEW WBC PRESENT,BOTH PMN AND MONONUCLEAR RARE GRAM POSITIVE COCCI IN PAIRS    Culture   Final    FEW VIRIDANS STREPTOCOCCUS SUSCEPTIBILITIES PERFORMED ON PREVIOUS CULTURE WITHIN THE LAST 5 DAYS. NO ANAEROBES ISOLATED    Report Status 03/24/2017 FINAL  Final  Fungus Culture Result     Status: None   Collection Time: 03/18/17  3:05 PM  Result Value Ref Range Status    Result 1 Comment  Final    Comment: (NOTE) KOH/Calcofluor preparation:  no fungus observed. Performed At: [facility] [place], [place] [id number] Lindon Romp MD UV:[id number]   Fungus Culture With Stain     Status: None (Preliminary result)   Collection Time: 03/18/17  3:06 PM  Result Value Ref Range Status   Fungus Stain Final report  Final    Comment: (NOTE) Performed At: [facility] [place], [place] [id number] Lindon Romp MD VF:[id number]    Fungus (Mycology) Culture PENDING  Incomplete   Fungal Source ABSCESS  Final    Comment: SUGALEAL PHLEGMON SPECIMEN C  Aerobic/Anaerobic Culture (surgical/deep wound)     Status: None   Collection Time: 03/18/17  3:06 PM  Result Value Ref Range Status   Specimen Description ABSCESS  Final   Special Requests SUBGALEAL PHLEGMON,SPECIMEN C  Final   Gram Stain   Final    FEW WBC PRESENT,BOTH PMN AND MONONUCLEAR NO ORGANISMS SEEN    Culture   Final    FEW VIRIDANS STREPTOCOCCUS CRITICAL RESULT CALLED TO, READ BACK BY AND VERIFIED WITH: J Savannah Erbe,MD AT 1140 03/20/17 BY L BENFIELD CONCERNING GROWTH ON CULTURE NO ANAEROBES ISOLATED    Report Status 03/24/2017 FINAL  Final   Organism ID, Bacteria VIRIDANS STREPTOCOCCUS  Final      Susceptibility   Viridans streptococcus - MIC*    ERYTHROMYCIN >=8 RESISTANT Resistant     LEVOFLOXACIN 1 SENSITIVE Sensitive     VANCOMYCIN 0.5 SENSITIVE Sensitive     * FEW VIRIDANS STREPTOCOCCUS  Fungus Culture Result     Status: None   Collection Time: 03/18/17  3:06 PM  Result Value Ref Range Status   Result 1 Comment  Final    Comment: (NOTE) KOH/Calcofluor preparation:  no  fungus observed. Performed At: [facility] [place], [place] [id number] Lindon Romp MD TS:[id number]   Fungus Culture With Stain     Status: None (Preliminary result)   Collection Time: 03/18/17  3:07 PM  Result Value Ref Range Status   Fungus  Stain Final report  Final    Comment: (NOTE) Performed At: [facility] [place], [place] [id number] Lindon Romp MD UQ:[id number]    Fungus (Mycology) Culture PENDING  Incomplete   Fungal Source BONE  Final    Comment: FLAP  Aerobic/Anaerobic Culture (surgical/deep wound)     Status: None   Collection Time: 03/18/17  3:07 PM  Result Value Ref Range Status   Specimen Description BONE  Final   Special Requests BONE FLAP  Final   Gram Stain   Final    ABUNDANT WBC PRESENT,BOTH PMN AND MONONUCLEAR NO ORGANISMS SEEN    Culture   Final    FEW VIRIDANS STREPTOCOCCUS CRITICAL RESULT CALLED TO, READ BACK BY AND VERIFIED WITH: J Juanmiguel Defelice,MD AT 1140 03/20/17 BY L BENFIELD CONCERNING GROWTH ON CULTURE NO ANAEROBES ISOLATED    Report Status 03/23/2017 FINAL  Final   Organism ID, Bacteria VIRIDANS STREPTOCOCCUS  Final      Susceptibility   Viridans streptococcus - MIC*    ERYTHROMYCIN >=8 RESISTANT Resistant     LEVOFLOXACIN 1 SENSITIVE Sensitive     VANCOMYCIN 0.5 SENSITIVE Sensitive     * FEW VIRIDANS STREPTOCOCCUS  Fungus Culture Result     Status: None   Collection Time: 03/18/17  3:07 PM  Result Value Ref Range Status   Result 1 Comment  Final    Comment: (NOTE) KOH/Calcofluor preparation:  no fungus observed. Performed At: [facility] [address] [place], [place] [id number] Lindon Romp MD JG:[id number]   Culture, respiratory (tracheal aspirate)     Status: None   Collection Time: 03/21/17  1:00 AM  Result Value Ref Range Status   Specimen Description TRACHEAL ASPIRATE  Final   Special Requests NONE  Final   Gram Stain FEW WBC PRESENT, PREDOMINANTLY PMN FEW YEAST   Final   Culture FEW CANDIDA ALBICANS  Final   Report Status 03/23/2017 FINAL  Final    Michel Bickers, MD [facility] [phone] pager   [phone] cell 03/24/2017, 1:56 PM

## 2017-03-24 NOTE — Progress Notes (Signed)
SLP Cancellation Note  Patient Details Name: AMOS GABER MRN: [id number] DOB: 10-19-1945   Cancelled treatment:       Reason Eval/Treat Not Completed: Medical issues which prohibited therapy. Potential endoscopy today. Will await readiness/need for swallow assessment.   Herbie [place], [place] CCC-SLP [phone]  Lynann Beaver 03/24/2017, 10:09 AM

## 2017-03-24 NOTE — Progress Notes (Signed)
[facility] ADULT ICU REPLACEMENT PROTOCOL FOR AM LAB REPLACEMENT ONLY  The patient does apply for the [facility] Adult ICU Electrolyte Replacment Protocol based on the criteria listed below:   1. Is GFR >/= 40 ml/min? Yes.    Patient's GFR today is >60 2. Is urine output >/= 0.5 ml/kg/hr for the last 6 hours? Yes.   Patient's UOP is 1.02 ml/kg/hr 3. Is BUN < 60 mg/dL? Yes.    Patient's BUN today is 9 4. Abnormal electrolyte  K 2.6 5. Ordered repletion with: per protocol 6. If a panic level lab has been reported, has the CCM MD in charge been notified? Yes.  .   Physician:  Illene Labrador, Canary Brim 03/24/2017 4:39 AM

## 2017-03-24 NOTE — Consult Note (Signed)
Reason for Consult: Nausea vomiting Referring Physician: Surgical team  Alexandria [name] is an 71 y.o. female.  HPI: Patient seen and examined and her hospital computer chart and x-rays reviewed as well as our office computer chart and she is seen by a different gastroenterologist in town and her colonoscopy from last year was reviewed but she's never had an endoscopy but has known she's had a hiatal hernia which has not been too bothersome until now and there is a question on whether it needs to be fixed due to periodic gastric outlet obstruction and nausea and vomiting but currently she is doing well and her case discussed with the surgeon and pulmonary team and she has no new complaints and we also discussed her case with multiple family members including a daughter on the phone  Past Medical History:  Diagnosis Date  . Cancer [facility]) 1996   breast cx  . Hypertension     Past Surgical History:  Procedure Laterality Date  . ABDOMINAL HYSTERECTOMY    . APPLICATION OF CRANIAL NAVIGATION N/A 10/27/2016   Procedure: APPLICATION OF CRANIAL NAVIGATION;  Surgeon: Erline Levine, MD;  Location: [place];  Service: Neurosurgery;  Laterality: N/A;  . CRANIOTOMY Left 10/27/2016   Procedure: CRANIOTOMY TUMOR EXCISION;  Surgeon: Erline Levine, MD;  Location: [place];  Service: Neurosurgery;  Laterality: Left;  . CRANIOTOMY Left 03/18/2017   Procedure: Left Frontal Cranial Wound Exploration with removal of bone flap;  Surgeon: Ditty, Kevan Ny, MD;  Location: [place];  Service: Neurosurgery;  Laterality: Left;  . IR FLUORO GUIDE CV LINE RIGHT  03/19/2017  . IR GENERIC HISTORICAL  11/02/2016   IR US GUIDE VASC ACCESS RIGHT 11/02/2016 Arne [place], MD [facility]  . IR GENERIC HISTORICAL  11/02/2016   IR FLUORO GUIDE CV LINE RIGHT 11/02/2016 Arne [place], MD [facility]  . IR REMOVAL TUN CV CATH W/O FL  12/10/2016  . IR US GUIDE VASC ACCESS RIGHT  03/19/2017  . MASTECTOMY     right breast    Family History   Problem Relation Age of Onset  . Breast cancer Mother   . Colon cancer Father     Social History:  reports that she has never smoked. She has never used smokeless tobacco. She reports that she drinks alcohol. She reports that she does not use drugs.  Allergies:  Allergies  Allergen Reactions  . Wasp Venom Anaphylaxis  . Ceftriaxone Rash    Pt developed truncal rash on Ceftriaxone, flagyl and also possibly still on keppra. It persisted for weeks with scarring. She did complete 2-3 days of abx while rash still going on. Dermatologist diagnosed as drug rash  . Keppra [Levetiracetam] Rash    Possible drug rash see other notes  . Flagyl [Metronidazole] Rash    Possible drug rash see other notes  . Sulfa Antibiotics Hives    Medications: I have reviewed the patient's current medications.  Results for orders placed or performed during the hospital encounter of 03/17/17 (from the past 48 hour(s))  BMET today at 2000     Status: Abnormal   Collection Time: 03/22/17  8:05 PM  Result Value Ref Range   Sodium 142 135 - 145 mmol/L   Potassium 3.7 3.5 - 5.1 mmol/L    Comment: DELTA CHECK NOTED   Chloride 108 101 - 111 mmol/L   CO2 27 22 - 32 mmol/L   Glucose, Bld 127 (H) 65 - 99 mg/dL   BUN 9 6 - 20  mg/dL   Creatinine, Ser 0.75 0.44 - 1.00 mg/dL   Calcium 8.0 (L) 8.9 - 10.3 mg/dL   GFR calc non Af Amer >60 >60 mL/min   GFR calc Af Amer >60 >60 mL/min    Comment: (NOTE) The eGFR has been calculated using the CKD EPI equation. This calculation has not been validated in all clinical situations. eGFR's persistently <60 mL/min signify possible Chronic Kidney Disease.    Anion gap 7 5 - 15  BMET in AM     Status: Abnormal   Collection Time: 03/23/17  3:39 AM  Result Value Ref Range   Sodium 143 135 - 145 mmol/L   Potassium 3.5 3.5 - 5.1 mmol/L   Chloride 110 101 - 111 mmol/L   CO2 28 22 - 32 mmol/L   Glucose, Bld 117 (H) 65 - 99 mg/dL   BUN 10 6 - 20 mg/dL   Creatinine, Ser 0.75  0.44 - 1.00 mg/dL   Calcium 8.1 (L) 8.9 - 10.3 mg/dL   GFR calc non Af Amer >60 >60 mL/min   GFR calc Af Amer >60 >60 mL/min    Comment: (NOTE) The eGFR has been calculated using the CKD EPI equation. This calculation has not been validated in all clinical situations. eGFR's persistently <60 mL/min signify possible Chronic Kidney Disease.    Anion gap 5 5 - 15  CBC     Status: Abnormal   Collection Time: 03/23/17  3:39 AM  Result Value Ref Range   WBC 12.4 (H) 4.0 - 10.5 K/uL   RBC 3.79 (L) 3.87 - 5.11 MIL/uL   Hemoglobin 10.1 (L) 12.0 - 15.0 g/dL   HCT 32.7 (L) 36.0 - 46.0 %   MCV 86.3 78.0 - 100.0 fL   MCH 26.6 26.0 - 34.0 pg   MCHC 30.9 30.0 - 36.0 g/dL   RDW 13.6 11.5 - 15.5 %   Platelets 228 150 - 400 K/uL  Basic metabolic panel     Status: Abnormal   Collection Time: 03/24/17  3:20 AM  Result Value Ref Range   Sodium 144 135 - 145 mmol/L   Potassium 2.6 (LL) 3.5 - 5.1 mmol/L    Comment: DELTA CHECK NOTED CRITICAL RESULT CALLED TO, READ BACK BY AND VERIFIED WITH: ALLEN J,RN 03/24/17 0426 WAYK    Chloride 108 101 - 111 mmol/L   CO2 28 22 - 32 mmol/L   Glucose, Bld 87 65 - 99 mg/dL   BUN 9 6 - 20 mg/dL   Creatinine, Ser 0.79 0.44 - 1.00 mg/dL   Calcium 8.0 (L) 8.9 - 10.3 mg/dL   GFR calc non Af Amer >60 >60 mL/min   GFR calc Af Amer >60 >60 mL/min    Comment: (NOTE) The eGFR has been calculated using the CKD EPI equation. This calculation has not been validated in all clinical situations. eGFR's persistently <60 mL/min signify possible Chronic Kidney Disease.    Anion gap 8 5 - 15  Magnesium     Status: None   Collection Time: 03/24/17  3:20 AM  Result Value Ref Range   Magnesium 2.1 1.7 - 2.4 mg/dL  CBC     Status: Abnormal   Collection Time: 03/24/17  3:20 AM  Result Value Ref Range   WBC 11.2 (H) 4.0 - 10.5 K/uL   RBC 3.61 (L) 3.87 - 5.11 MIL/uL   Hemoglobin 9.7 (L) 12.0 - 15.0 g/dL   HCT 31.0 (L) 36.0 - 46.0 %   MCV 85.9 78.0 -  100.0 fL   MCH 26.9  26.0 - 34.0 pg   MCHC 31.3 30.0 - 36.0 g/dL   RDW 13.6 11.5 - 15.5 %   Platelets 224 150 - 400 K/uL    Dg Chest Port 1 View  Result Date: 03/24/2017 CLINICAL DATA:  Respiratory failure. EXAM: PORTABLE CHEST 1 VIEW COMPARISON:  03/23/2017.  03/22/2017.  CT 03/21/2017. FINDINGS: Interim extubation. NG tube and right IJ line stable position. Heart size stable. No pulmonary venous congestion noted on today's exam. Persistent basilar atelectasis/infiltrates. Large hiatal hernia again noted. IMPRESSION: 1. Interim extubation.  NG tube and right IJ line stable position. 2. Persistent bibasilar atelectasis/ infiltrates. Bilateral pleural effusions. No significant change from prior exam. 3. Heart size stable. No pulmonary venous congestion noted on today's exam . Electronically Signed   By: Marcello Moores  Register   On: 03/24/2017 07:37   Dg Chest Port 1 View  Result Date: 03/23/2017 CLINICAL DATA:  Respiratory failure, history of hypertension and breast malignancy. History of craniotomy for infected brain flap endplate. EXAM: PORTABLE CHEST 1 VIEW COMPARISON:  Portable chest x-ray of March 22, 2017 FINDINGS: The lungs are reasonably well inflated. Increased density at both lung bases persists and is slightly more conspicuous. Small bilateral pleural effusions are suspected. Bibasilar atelectasis or pneumonia is likely present. The heart is top-normal in size. The pulmonary vascularity is mildly prominent centrally. The endotracheal tube tip lies approximately 3 cm above the carina. The esophagogastric tube tip in proximal port project in the gastric cardia region. The right internal jugular venous catheter tip projects over the midportion of the SVC. IMPRESSION: Slight interval increase in bibasilar density consistent with pleural effusions with atelectasis or pneumonia. Probable low-grade CHF. The support tubes are in reasonable position. Electronically Signed   By: David  Martinique M.D.   On: 03/23/2017 07:30   Dg  Abd Portable 1v  Result Date: 03/23/2017 CLINICAL DATA:  OG tube placement. EXAM: PORTABLE ABDOMEN - 1 VIEW COMPARISON:  CT 03/21/2017 . FINDINGS: OG tube noted with its tip coiled in the stomach. No bowel distention. Basilar atelectasis . IMPRESSION: OG tube noted with its tip coiled in the stomach. Electronically Signed   By: Marcello Moores  Register   On: 03/23/2017 10:58    ROS negative except above Blood pressure (!) 184/141, pulse (!) 122, temperature 99.2 F (37.3 C), temperature source Oral, resp. rate (!) 29, height 5' 1.5" (1.562 m), weight 73.4 kg (161 lb 13.1 oz), SpO2 96 %. Physical Exam vital signs stable afebrile no acute distress patient looks good better than chart would suggest lying comfortably in the bed abdomen is soft nontender NG in place x-rays reviewed as above labs stable except for potassium  Assessment/Plan: Multiple medical problems including paraesophageal hernia Plan: The risks benefits methods of endoscopy was discussed with the patient in multiple family members and will proceed tomorrow as requested by surgical team 1 PM with further workup and plans pending those findings  [place] E 03/24/2017, 11:44 AM

## 2017-03-24 NOTE — Progress Notes (Signed)
[facility] Progress Note  6 Days Post-Op  Subjective: CC:  Extubated. Daughter at bedside. Patient denies abdominal pain. Reports abdominal "gurgling" and one very small, loose BM that was non-bloody. Denies nausea/vomiting around NGT. Denies distention. Having intermittent HA's. Reports strange dreams/hallucinations with IV dilaudid.  Objective: Vital signs in last 24 hours: Temp:  [98.7 F (37.1 C)-101.1 F (38.4 C)] 99.2 F (37.3 C) (08/21 0402) Pulse Rate:  [87-133] 113 (08/21 0600) Resp:  [15-30] 20 (08/21 0600) BP: (115-168)/(68-140) 157/97 (08/21 0600) SpO2:  [91 %-100 %] 93 % (08/21 0600) FiO2 (%):  [40 %] 40 % (08/20 1208) Weight:  [73.4 kg (161 lb 13.1 oz)] 73.4 kg (161 lb 13.1 oz) (08/21 0300) Last BM Date: 03/19/17  Intake/Output from previous day: 08/20 0701 - 08/21 0700 In: 1767.1 [I.V.:1267.1; IV Piggyback:500] Out: 1925 [Urine:925; Emesis/NG output:1000] Intake/Output this shift: No intake/output data recorded.  PE: Gen:  alert, NAD, cooperative and pleasant HEENT: NGT in R nare; EOM's in tact, anicteric sclerae  Card: tachycardic, regular rhythm, pedal pulses 2+ BL Pulm: normal respiratory effort, decreased breath sounds in bilateral lung bases Abd: Soft, non-tender, non-distended, bowel sounds present in all 4 quadrants  Skin: warm and dry, no rashes  Psych: A&Ox3   Lab Results:   Recent Labs  03/23/17 0339 03/24/17 0320  WBC 12.4* 11.2*  HGB 10.1* 9.7*  HCT 32.7* 31.0*  PLT 228 224   BMET  Recent Labs  03/23/17 0339 03/24/17 0320  NA 143 144  K 3.5 2.6*  CL 110 108  CO2 28 28  GLUCOSE 117* 87  BUN 10 9  CREATININE 0.75 0.79  CALCIUM 8.1* 8.0*   PT/INR No results for input(s): LABPROT, INR in the last 72 hours. CMP     Component Value Date/Time   NA 144 03/24/2017 0320   K 2.6 (LL) 03/24/2017 0320   CL 108 03/24/2017 0320   CO2 28 03/24/2017 0320   GLUCOSE 87 03/24/2017 0320   BUN 9 03/24/2017 0320   CREATININE  0.79 03/24/2017 0320   CALCIUM 8.0 (L) 03/24/2017 0320   PROT 5.1 (L) 03/21/2017 0107   ALBUMIN 2.6 (L) 03/21/2017 0107   AST 20 03/21/2017 0107   ALT 12 (L) 03/21/2017 0107   ALKPHOS 47 03/21/2017 0107   BILITOT 0.6 03/21/2017 0107   GFRNONAA >60 03/24/2017 0320   GFRAA >60 03/24/2017 0320   Lipase     Component Value Date/Time   LIPASE 25 03/21/2017 0107       Studies/Results: Dg Chest Port 1 View  Result Date: 03/24/2017 CLINICAL DATA:  Respiratory failure. EXAM: PORTABLE CHEST 1 VIEW COMPARISON:  03/23/2017.  03/22/2017.  CT 03/21/2017. FINDINGS: Interim extubation. NG tube and right IJ line stable position. Heart size stable. No pulmonary venous congestion noted on today's exam. Persistent basilar atelectasis/infiltrates. Large hiatal hernia again noted. IMPRESSION: 1. Interim extubation.  NG tube and right IJ line stable position. 2. Persistent bibasilar atelectasis/ infiltrates. Bilateral pleural effusions. No significant change from prior exam. 3. Heart size stable. No pulmonary venous congestion noted on today's exam . Electronically Signed   By: Marcello Moores  Register   On: 03/24/2017 07:37   Dg Chest Port 1 View  Result Date: 03/23/2017 CLINICAL DATA:  Respiratory failure, history of hypertension and breast malignancy. History of craniotomy for infected brain flap endplate. EXAM: PORTABLE CHEST 1 VIEW COMPARISON:  Portable chest x-ray of March 22, 2017 FINDINGS: The lungs are reasonably well inflated. Increased density at both lung bases  persists and is slightly more conspicuous. Small bilateral pleural effusions are suspected. Bibasilar atelectasis or pneumonia is likely present. The heart is top-normal in size. The pulmonary vascularity is mildly prominent centrally. The endotracheal tube tip lies approximately 3 cm above the carina. The esophagogastric tube tip in proximal port project in the gastric cardia region. The right internal jugular venous catheter tip projects over the  midportion of the SVC. IMPRESSION: Slight interval increase in bibasilar density consistent with pleural effusions with atelectasis or pneumonia. Probable low-grade CHF. The support tubes are in reasonable position. Electronically Signed   By: David  Martinique M.D.   On: 03/23/2017 07:30   Dg Abd Portable 1v  Result Date: 03/23/2017 CLINICAL DATA:  OG tube placement. EXAM: PORTABLE ABDOMEN - 1 VIEW COMPARISON:  CT 03/21/2017 . FINDINGS: OG tube noted with its tip coiled in the stomach. No bowel distention. Basilar atelectasis . IMPRESSION: OG tube noted with its tip coiled in the stomach. Electronically Signed   By: Marcello Moores  Register   On: 03/23/2017 10:58    Anti-infectives: Anti-infectives    Start     Dose/Rate Route Frequency Ordered Stop   03/19/17 1200  meropenem (MERREM) 2 g in sodium chloride 0.9 % 100 mL IVPB     2 g 200 mL/hr over 30 Minutes Intravenous Every 8 hours 03/19/17 1109     03/19/17 1200  vancomycin (VANCOCIN) IVPB 750 mg/150 ml premix  Status:  Discontinued     750 mg 150 mL/hr over 60 Minutes Intravenous Every 12 hours 03/19/17 1109 03/20/17 1351   03/18/17 1532  bacitracin 50,000 Units in sodium chloride irrigation 0.9 % 500 mL irrigation  Status:  Discontinued       As needed 03/18/17 1533 03/18/17 1555   03/18/17 1530  meropenem (MERREM) 1 g in sodium chloride 0.9 % 100 mL IVPB     1 g 200 mL/hr over 30 Minutes Intravenous To Surgery 03/18/17 1516 03/18/17 1544   03/18/17 1524  vancomycin (VANCOCIN) powder  Status:  Discontinued       As needed 03/18/17 1524 03/18/17 1555   03/18/17 1500  ceFEPIme (MAXIPIME) 2 g in dextrose 5 % 50 mL IVPB  Status:  Discontinued     2 g 100 mL/hr over 30 Minutes Intravenous To Surgery 03/18/17 1459 03/18/17 1512   03/18/17 1428  vancomycin (VANCOCIN) 1-5 GM/200ML-% IVPB    Comments:  Trixie Deis   : cabinet override      03/18/17 1428 03/19/17 0229     Assessment/Plan 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 Had a small BM. Continue NPO and NG to LIWS - consult GI for possible upper endoscopy and attempted reduction of HH - SLP eval and await further bowel function - while patient will require surgical repair of HH, ideally this can be scheduled in the upcoming months once patient has time to heal/rehab from recent acute events. If patient is having bowel function, swallowing safely, and tolerating a diet, surgery can be scheduled at a later date. If patient unable to tolerate a diet 2/2 sxs of GOO, we will proceed with surgery this admission. We will continue to follow.   FEN: NPO, IVF; hypokalemia (2.6), Mg 2.1 ID:  WBC  11.2 from 12.4; Merrem 8/16 >> VTE: SCD's, SQ heparin Foley: In place, UOP 925 cc/24h   LOS: 6 days    Jill Alexanders , [facility] 03/24/2017, 7:53 AM Pager: [phone] Consults: [phone] Mon-Fri 7:00 am-4:30 pm Sat-Sun 7:00 am-11:30 am

## 2017-03-24 NOTE — Progress Notes (Signed)
[facility] Progress Note Patient Name: [name] DOB: [birth year] MRN: [id number]   Date of Service  03/24/2017  HPI/Events of Note  Tachy, neg 450  Output agin likley hypovolmeic  eICU Interventions  Bolus assess after bolus if remains tachy Trop low, no cp, no ischemia noted     Intervention Category Major Interventions: Arrhythmia - evaluation and management  Raylene Miyamoto. 03/24/2017, 6:21 PM

## 2017-03-24 NOTE — Progress Notes (Signed)
[facility] Progress Note Patient Name: Alexandria [name] DOB: 07/13/1946 MRN: [id number]   Date of Service  03/24/2017  HPI/Events of Note  Patient c/o throbbing headache - BP = 144/83 with MAP = 96. Headache likely d/t muscle tension.   eICU Interventions  Would treat for pain first with ordered Tylenol and/or Dilaudid. If SBP > 160 or DBP > 100, would treat BP more aggressively.      Intervention Category Intermediate Interventions: Pain - evaluation and management  Sommer,Steven Eugene 03/24/2017, 12:52 AM

## 2017-03-24 NOTE — Progress Notes (Signed)
Pt is tachy in the 130s/140s.  Pt states she feels her heart is beating fast.  Notified MD.  EKG completed.  Will continue to monitor

## 2017-03-24 NOTE — Progress Notes (Signed)
Reason for consult: 03/21/17 Acute hypoxic respiratory failure due to aspiration   In summary: 71 yo F with Hx of hiatal hernia; who had craniotomy on 3/26 for brain abscess. Presented on 8/14 with signs of sepsis and increased drainage from her wound found to have subgaleal abscess. Taken to the OR on 8/15 for Lt frontal cranial wound exploration and removal of bone. Flap. Hospital course complicated by nausea, vomiting and diarrhea. Patient had increased O2 requirement for which we been asked to see the patient.  MRI 8/14 with no evidence of recurrent abscess. New small fluid collectin underlyuing the left frontal scalp wound.  CT ABD 8/18 with very large paraesophageal hernia , large hepatic cyst    Subjective:  Extubated 8/20 and tolerating well  Vitals:   03/24/17 0600 03/24/17 0700 03/24/17 0800 03/24/17 1000  BP: (!) 157/97 (!) 161/89 (!) 170/99 (!) 189/74  Pulse: (!) 113 (!) 115 (!) 111 (!) 128  Resp: 20 16 (!) 22 (!) 34  Temp:      TempSrc:      SpO2: 93% 98% 95% (!) 89%  Weight:      Height:        GEN : Comfortable, no distress HEENT : NG tube in place and intermittent suction PULM: Coarse bilateral breath sounds, no wheezing CARD : Distant, regular, no murmur ABD : Soft, slightly distended, hypoactive bowel sounds Skin : Warm, no rash   A/P: 1. Acute hypoxic respiratory failure, resolved 2. Possible Aspiration pneumonia / pneumonitis  3. Sepsis, improved 4. Encephalopathy, improved 5. 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 Subgaleal abscess  7. Hypokalemia, improved   Plan: - Discussed case with Dr. Donne Hazel 8/20, appreciate assistance. Needs to continue gastric tube suction and decompression. Hopefully gastric outlet obstruction will reduce/be relieved. If not then surgical intervention may be required during this hospitalization. Goal would be to defer until she has recovered from her original illness.  GI is going to see her today to discuss possible endoscopy - Pulmonary hygiene - Sedation off - Continue current imipenem, plan is for her to be on this for an extended course to address her subgaleal abscess, bone flap infection - PPI, Reglan as ordered - Subcutaneous heparin as ordered   PCCM will sign off. Please call if we can assist in any way   Baltazar Apo, MD, PhD 03/24/2017, 10:52 AM [facility] [phone] or if no answer [phone]

## 2017-03-25 ENCOUNTER — Inpatient Hospital Stay (HOSPITAL_COMMUNITY): Payer: Medicare Other | Admitting: Certified Registered Nurse Anesthetist

## 2017-03-25 ENCOUNTER — Encounter (HOSPITAL_COMMUNITY)
Admission: AD | Disposition: A | Discharge status: Other Acute Inpatient Hospital | Payer: Self-pay | Source: Ambulatory Visit | Attending: Neurological Surgery

## 2017-03-25 ENCOUNTER — Inpatient Hospital Stay (HOSPITAL_COMMUNITY): Payer: Medicare Other

## 2017-03-25 ENCOUNTER — Encounter (HOSPITAL_COMMUNITY): Payer: Self-pay | Admitting: *Deleted

## 2017-03-25 HISTORY — PX: ESOPHAGOGASTRODUODENOSCOPY (EGD) WITH PROPOFOL: SHX5813

## 2017-03-25 LAB — GLUCOSE, CAPILLARY
GLUCOSE-CAPILLARY: 110 mg/dL — AB (ref 65–99)
GLUCOSE-CAPILLARY: 125 mg/dL — AB (ref 65–99)
GLUCOSE-CAPILLARY: 144 mg/dL — AB (ref 65–99)
GLUCOSE-CAPILLARY: 155 mg/dL — AB (ref 65–99)
Glucose-Capillary: 141 mg/dL — ABNORMAL HIGH (ref 65–99)
Glucose-Capillary: 120 mg/dL — ABNORMAL HIGH (ref 65–99)
Glucose-Capillary: 144 mg/dL — ABNORMAL HIGH (ref 65–99)

## 2017-03-25 LAB — MAGNESIUM: Magnesium: 2.1 mg/dL (ref 1.7–2.4)

## 2017-03-25 LAB — COMPREHENSIVE METABOLIC PANEL
ALBUMIN: 2 g/dL — AB (ref 3.5–5.0)
ALT: 12 U/L — ABNORMAL LOW (ref 14–54)
AST: 17 U/L (ref 15–41)
Alkaline Phosphatase: 56 U/L (ref 38–126)
Anion gap: 6 (ref 5–15)
BILIRUBIN TOTAL: 0.6 mg/dL (ref 0.3–1.2)
BUN: 7 mg/dL (ref 6–20)
CHLORIDE: 109 mmol/L (ref 101–111)
CO2: 27 mmol/L (ref 22–32)
Calcium: 8.2 mg/dL — ABNORMAL LOW (ref 8.9–10.3)
Creatinine, Ser: 0.57 mg/dL (ref 0.44–1.00)
GFR calc Af Amer: >60 mL/min (ref >60)
GFR calc non Af Amer: >60 mL/min (ref >60)
GLUCOSE: 151 mg/dL — AB (ref 65–99)
POTASSIUM: 3.3 mmol/L — AB (ref 3.5–5.1)
SODIUM: 142 mmol/L (ref 135–145)
Total Protein: 5.5 g/dL — ABNORMAL LOW (ref 6.5–8.1)

## 2017-03-25 LAB — PREALBUMIN: Prealbumin: <5 mg/dL — ABNORMAL LOW (ref 18–38)

## 2017-03-25 LAB — TROPONIN I: TROPONIN I: 0.06 ng/mL — AB (ref <0.03)

## 2017-03-25 LAB — PHOSPHORUS: Phosphorus: <1 mg/dL — CL (ref 2.5–4.6)

## 2017-03-25 LAB — TRIGLYCERIDES: TRIGLYCERIDES: 169 mg/dL — AB (ref <150)

## 2017-03-25 SURGERY — ESOPHAGOGASTRODUODENOSCOPY (EGD) WITH PROPOFOL
Anesthesia: Monitor Anesthesia Care

## 2017-03-25 MED ORDER — MEPERIDINE HCL 100 MG/ML IJ SOLN: 6.2500 mg | INTRAMUSCULAR | Status: DC | PRN

## 2017-03-25 MED ORDER — SODIUM CHLORIDE 0.9 % IV SOLN
INTRAVENOUS | Status: DC
  Administered 2017-03-25: 09:00:00 via INTRAVENOUS

## 2017-03-25 MED ORDER — POTASSIUM PHOSPHATES 15 MMOLE/5ML IV SOLN
30.0000 mmol | Freq: Once | INTRAVENOUS | Status: AC
  Administered 2017-03-25: 30 mmol via INTRAVENOUS
  Filled 2017-03-25: qty 10

## 2017-03-25 MED ORDER — LACTATED RINGERS IV SOLN
INTRAVENOUS | Status: DC
  Administered 2017-03-25: 1000 mL via INTRAVENOUS

## 2017-03-25 MED ORDER — IOPAMIDOL (ISOVUE-300) INJECTION 61%
INTRAVENOUS | Status: AC
  Filled 2017-03-25: qty 50

## 2017-03-25 MED ORDER — PROPOFOL 10 MG/ML IV BOLUS
INTRAVENOUS | Status: DC | PRN
  Administered 2017-03-25: 20 mg via INTRAVENOUS

## 2017-03-25 MED ORDER — ONDANSETRON HCL 4 MG/2ML IJ SOLN: 4.0000 mg | Freq: Once | INTRAMUSCULAR | Status: DC | PRN

## 2017-03-25 MED ORDER — PROPOFOL 500 MG/50ML IV EMUL
INTRAVENOUS | Status: DC | PRN
  Administered 2017-03-25: 50 ug/kg/min via INTRAVENOUS

## 2017-03-25 MED ORDER — POTASSIUM CHLORIDE 10 MEQ/50ML IV SOLN
10.0000 meq | INTRAVENOUS | Status: AC
  Administered 2017-03-25 (×3): 10 meq via INTRAVENOUS
  Filled 2017-03-25: qty 50

## 2017-03-25 MED ORDER — TRACE MINERALS CR-CU-MN-SE-ZN 10-1000-500-60 MCG/ML IV SOLN
INTRAVENOUS | Status: AC
  Administered 2017-03-25: 17:00:00 via INTRAVENOUS
  Filled 2017-03-25: qty 960

## 2017-03-25 MED ORDER — FENTANYL CITRATE (PF) 100 MCG/2ML IJ SOLN: 25.0000 ug | INTRAMUSCULAR | Status: DC | PRN

## 2017-03-25 MED ORDER — POTASSIUM PHOSPHATES 15 MMOLE/5ML IV SOLN: 40.0000 meq | Freq: Once | INTRAVENOUS | Status: DC

## 2017-03-25 SURGICAL SUPPLY — 14 items

## 2017-03-25 NOTE — Progress Notes (Signed)
PHARMACY - ADULT TOTAL PARENTERAL NUTRITION CONSULT NOTE   Pharmacy Consult for TPN Indication: Intolerance of enteral feeding  Patient Measurements: Height: 5' 1.5" (156.2 cm) Weight: 160 lb 4.4 oz (72.7 kg) IBW/kg (Calculated) : 48.95 TPN AdjBW (KG): 54.4 Body mass index is 29.79 kg/m.   Assessment:  71yo female admitted 8/14 s/p outpt clinic wound check with swelling at craniotomy site; pt s/p craniotomy 10/27/16 for brain abscess, MRI in July with no recurrent abscess.   She returned to OR on 8/15 for cranial wound exploration and removal of bone flap, and has since been NPO with N/V/diarrhea.  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   Clear liquids were ordered 8/15 and then advanced to Regular diet on 8/16, which pt did not tolerate due to abdominal pain and repeated nausea/vomiting and was subsequently made NPO on 8/17.  Pt has had little to no intake x 7 days and started TPN on 8/21.  GI: GOO. Upper endoscopy today.  Considering G-tube placement eventually, timing yet to be determined.  NG tube with 948mL out (improved from 1024mL). on PPI-IV  Endo:  No hx DM, on sensitive SSI, CBGs 125 - 141, Insulin requirements in the past 24 hours:  received 4 units total since starting TPN  Lytes: K 2.6 > 3.3 - K runs x 5 per MD, Mg 2.1, corrected Ca 9.7, phos < 1,  Renal:  Cr < 1.  UOP 0.8 mL/kg/hr.  NS at 30 ml/hr Pulm:  HFNC 6L O2, (extubated 8/20).  Albuterol nebs, mucomyst nebs x 72hr (8/20 >> ) Cards:   BP high, tachy. On toprol  Hepatobil:  LFTs wnl on 8/18, T bili 0.6, stable, prealbumin < 5 Neuro:  Morphine IV prn severe pain ID:  Meropenem for Viridans strep wound infection.  WBC 11.2, Tm 101.1  Best Practices: mouth care, heparin SQ TPN Access:  CVL- double lumen (8/16) TPN start date: 8/21  Nutritional Goals (per RD recommendation on  8/18): RD note:  Eating well pta but 9% wt loss in 4 mo KCal:  1293 kcal, will go with goal 1250-1350 kcal  Protein:  100-110g  Current Nutrition:   Plan:  Clinimix 5/15-E at 40, continue at lower rate d/t concern of refeeding Add MVI and Trace Elements to tpn No Lipid emulsion x 7 days in ICU pt per ASPEN guidelines (day #2/7 8/21) Continue Sensitive SSI q4 TPN labs qMon/Thurs F/U plans for G-tube and ability to transition to tube feedings Kphos 30 mmol IV x 1 KCL runs x 4 F/u K and Phos (BMet, Phos in AM)  Maryanna Shape, PharmD, BCPS  Clinical Pharmacist  Pager: [phone]

## 2017-03-25 NOTE — Op Note (Signed)
[facility] Patient Name: Alexandria [name] Procedure Date : 03/25/2017 MRN: [id number] Attending MD: Clarene Essex , MD Date of Birth: 02/24/1946 CSN: [id number] Age: 71 Admit Type: Inpatient Procedure:                Upper GI endoscopy Indications:              Abnormal CT of the GI tract, Failure to thrive,                            Nausea with vomiting Providers:                Clarene Essex, MD, Cleda Daub, RN, Elspeth Cho                            Tech., Technician, Tawni Carnes, CRNA Referring MD:              Medicines:                Propofol total dose 102 mg IV Complications:            No immediate complications. Estimated Blood Loss:     Estimated blood loss: none. Procedure:                Pre-Anesthesia Assessment:                           - Prior to the procedure, a History and Physical                            was performed, and patient medications and                            allergies were reviewed. The patient's tolerance of                            previous anesthesia was also reviewed. The risks                            and benefits of the procedure and the sedation                            options and risks were discussed with the patient.                            All questions were answered, and informed consent                            was obtained. Prior Anticoagulants: The patient has                            taken no previous anticoagulant or antiplatelet                            agents. ASA Grade Assessment: III - A patient with  severe systemic disease. After reviewing the risks                            and benefits, the patient was deemed in                            satisfactory condition to undergo the procedure.                           After obtaining informed consent, the endoscope was                            passed under direct vision. Throughout the   procedure, the patient's blood pressure, pulse, and                            oxygen saturations were monitored continuously. The                            [id number]-[id number]) scope was introduced through the                            mouth, and advanced to the second part of duodenum.                            The upper GI endoscopy was somewhat difficult due                            to abnormal anatomy. The patient tolerated the                            procedure well. unfortunately in pulling the scope                            out the NG tube came out with it but we were able                            to place the feeding tube which was confirmed with                            Gastrografin injection to be in the duodenum Scope In: Scope Out: Findings:      The larynx was normal.      Localized mucosal changes characterized by congestion were found in the       lower third of the esophagus. probably ischemic from twisting but looked       cystic and edematous and included the cardia as well      Localized moderate inflammation characterized by erythema was found on       the greater curvature of the stomach. probably ischemic in nature from       twisting      A medium diverticulum was found in the second portion of the duodenum. A       guide wire was inserted into the duodenum and the endoscope was removed  under fluoroscopy guidance making sure the wire stayed in the duodenum.       An orojejunal tube was advanced over the guide wire into the duodenum.       we then placed a nasal oral transfer tube and withdrew the wire through       that o the wire was now coming out the nose and replace the feeding tube       over the wire which Placement was confirmed by fluoroscopy.      The exam was otherwise without abnormality. Impression:               - Normal larynx.                           - Congested mucosa in the esophagusand cardia.                           -  Gastritis. linear probably ischemic from 20 sore                            compression                           - Duodenal diverticulum.                           - The examination was otherwise normal.                           - Feeding tube placement was successfully                            performed. confirmed with fluoroscopy and injecting                            Gastrografin                           - No specimens collected. unfortunately NG tube was                            pulled back with withdrawing the scope and was                            removed Moderate Sedation:      moderate sedation-none Recommendation:           - NPO today. okay to start tube feeds if okay with                            surgical team at very slow rate                           - Continue present medications.                           - Return to GI clinic PRN.                           -  Telephone GI clinic if symptomatic PRN. consider                            replacing NG tube if needed and consider asking IR                            to place a longer tube for feeding if needed and                            happy to repeat endoscopy when necessary Procedure Code(s):        --- Professional ---                           [phone], Esophagogastroduodenoscopy, flexible,                            transoral; with insertion of intraluminal tube or                            catheter Diagnosis Code(s):        --- Professional ---                           K22.8, Other specified diseases of esophagus                           K29.70, Gastritis, unspecified, without bleeding                           R62.7, Adult failure to thrive                           R11.2, Nausea with vomiting, unspecified                           K57.10, Diverticulosis of small intestine without                            perforation or abscess without bleeding                           R93.3, Abnormal findings on  diagnostic imaging of                            other parts of digestive tract CPT copyright 2016 [employer]. All rights reserved. The codes documented in this report are preliminary and upon coder review may  be revised to meet current compliance requirements. Clarene Essex, MD 03/25/2017 2:34:40 PM This report has been signed electronically. Number of Addenda: 0

## 2017-03-25 NOTE — Progress Notes (Signed)
Pt return from Endoscopy.  NG slipped out during procedure.  RN from endoscopy said MD ok with NG out and do not need to place another one at this time.  Will continue to monitor.

## 2017-03-25 NOTE — Progress Notes (Signed)
7 Days Post-Op    CC:  Nausea and vomiting with large hiatal hernia  Subjective: She is extubated, I fixed the NG and it is working well.  For EGD today.    Objective: Vital signs in last 24 hours: Temp:  [98.4 F (36.9 C)-99 F (37.2 C)] 98.6 F (37 C) (08/22 0800) Pulse Rate:  [118-146] 125 (08/22 0800) Resp:  [16-34] 32 (08/22 0800) BP: (132-189)/(72-141) 156/91 (08/22 0800) SpO2:  [89 %-98 %] 91 % (08/22 0800) Weight:  [72.7 kg (160 lb 4.4 oz)] 72.7 kg (160 lb 4.4 oz) (08/22 0214) Last BM Date: 03/24/17 2061 IV NPO 1400 urine 900 NG Afebrile since 03/23/17 DBP up some K+ 3.3 glucose 151 Troponin up 0.35m 0.09, 0.06 since yesterday Glucose 125-141 range Prealbumin <5 1. Interim extubation.  NG tube and right IJ line stable position. 2. Persistent bibasilar atelectasis/ infiltrates. Bilateral pleural effusions. No significant change from prior exam. 3. Heart size stable. No pulmonary venous congestion noted on today's exam.   Intake/Output from previous day: 08/21 0701 - 08/22 0700 In: 2061.7 [I.V.:1261.7; IV Piggyback:800] Out: 2300 [Urine:1400; Emesis/NG output:900] Intake/Output this shift: No intake/output data recorded.  General appearance: alert, cooperative and no distress GI: soft, non-tender; bowel sounds normal; no masses,  no organomegaly  Lab Results:   Recent Labs  03/23/17 0339 03/24/17 0320  WBC 12.4* 11.2*  HGB 10.1* 9.7*  HCT 32.7* 31.0*  PLT 228 224    BMET  Recent Labs  03/24/17 2107 03/25/17 0501  NA 144 142  K 3.0* 3.3*  CL 110 109  CO2 26 27  GLUCOSE 137* 151*  BUN 6 7  CREATININE 0.66 0.57  CALCIUM 8.1* 8.2*   PT/INR No results for input(s): LABPROT, INR in the last 72 hours.   Recent Labs Lab 03/21/17 0107 03/25/17 0501  AST 20 17  ALT 12* 12*  ALKPHOS 47 56  BILITOT 0.6 0.6  PROT 5.1* 5.5*  ALBUMIN 2.6* 2.0*     Lipase     Component Value Date/Time   LIPASE 25 03/21/2017 0107     Prior to  Admission medications   Medication Sig Start Date End Date Taking? Authorizing Provider  benazepril (LOTENSIN) 20 MG tablet Take 1 tablet (20 mg total) by mouth daily. 12/18/16  Yes Alexandria Staggers, MD  metoprolol succinate (TOPROL XL) 50 MG 24 hr tablet Take 1 tablet (50 mg total) by mouth daily. Take with or immediately following a meal. Patient taking differently: Take 50 mg by mouth at bedtime. Take with or immediately following a meal. 11/17/16  Yes Alexandria Staggers, MD  pantoprazole (PROTONIX) 40 MG tablet Take 1 tablet (40 mg total) by mouth daily. 12/24/16  Yes Alexandria Staggers, MD  raloxifene (EVISTA) 60 MG tablet Take 60 mg by mouth daily.   Yes [provider]  saccharomyces boulardii (FLORASTOR) 250 MG capsule Take 250 mg by mouth 2 (two) times daily.   Yes [provider]    Medications: . acetylcysteine  4 mL Nebulization QID  . albuterol  2.5 mg Nebulization QID  . chlorhexidine  15 mL Mouth Rinse BID  . Chlorhexidine Gluconate Cloth  6 each Topical Daily  . heparin  5,000 Units Subcutaneous Q8H  . insulin aspart  0-9 Units Subcutaneous Q4H  . mouth rinse  15 mL Mouth Rinse q12n4p  . metoprolol succinate  50 mg Oral Daily  . pantoprazole (PROTONIX) IV  40 mg Intravenous Daily   . Marland KitchenTPN (CLINIMIX-E) Adult  40 mL/hr at 03/25/17 0700  . Marland KitchenTPN (CLINIMIX-E) Adult    . sodium chloride    . sodium chloride 20 mL/hr at 03/25/17 0907  . sodium chloride 10 mL/hr at 03/25/17 0700  . meropenem (MERREM) IV Stopped (03/25/17 0530)  . phenylephrine (NEO-SYNEPHRINE) Adult infusion Stopped (03/23/17 3893)  . potassium chloride    . potassium PHOSPHATE IVPB (mmol)     Anti-infectives    Start     Dose/Rate Route Frequency Ordered Stop   03/19/17 1200  meropenem (MERREM) 2 g in sodium chloride 0.9 % 100 mL IVPB     2 g 200 mL/hr over 30 Minutes Intravenous Every 8 hours 03/19/17 1109     03/19/17 1200  vancomycin (VANCOCIN) IVPB 750 mg/150 ml premix  Status:   Discontinued     750 mg 150 mL/hr over 60 Minutes Intravenous Every 12 hours 03/19/17 1109 03/20/17 1351   03/18/17 1532  bacitracin 50,000 Units in sodium chloride irrigation 0.9 % 500 mL irrigation  Status:  Discontinued       As needed 03/18/17 1533 03/18/17 1555   03/18/17 1530  meropenem (MERREM) 1 g in sodium chloride 0.9 % 100 mL IVPB     1 g 200 mL/hr over 30 Minutes Intravenous To Surgery 03/18/17 1516 03/18/17 1544   03/18/17 1524  vancomycin (VANCOCIN) powder  Status:  Discontinued       As needed 03/18/17 1524 03/18/17 1555   03/18/17 1500  ceFEPIme (MAXIPIME) 2 g in dextrose 5 % 50 mL IVPB  Status:  Discontinued     2 g 100 mL/hr over 30 Minutes Intravenous To Surgery 03/18/17 1459 03/18/17 1512   03/18/17 1428  vancomycin (VANCOCIN) 1-5 GM/200ML-% IVPB    Comments:  Alexandria [name]   : cabinet override      03/18/17 1428 03/19/17 0229      Assessment/Plan CC:  Nausea/vomiting Large hiatal hernia with likely gastric outlet obstruction 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  Continue work up and evaluation process.          LOS: 7 days    Alexandria [name] 03/25/2017 [phone]

## 2017-03-25 NOTE — Transfer of Care (Signed)
Immediate Anesthesia Transfer of Care Note  Patient: Alexandria [name]  Procedure(s) Performed: Procedure(s): ESOPHAGOGASTRODUODENOSCOPY (EGD) WITH PROPOFOL (N/A)  Patient Location: Endoscopy Unit  Anesthesia Type:MAC  Level of Consciousness: awake, alert , oriented and patient cooperative  Airway & Oxygen Therapy: Patient Spontanous Breathing and Patient connected to face mask oxygen  Post-op Assessment: Report given to RN and Post -op Vital signs reviewed and stable  Post vital signs: Reviewed and stable  Last Vitals:  Vitals:   03/25/17 1204 03/25/17 1437  BP:  (!) 156/87  Pulse: (!) 128 (!) 129  Resp: (!) 25 (!) 24  Temp: 36.8 C   SpO2:  94%    Last Pain:  Vitals:   03/25/17 1204  TempSrc: Oral  PainSc:       Patients Stated Pain Goal: 8 (89/34/06 8403)  Complications: No apparent anesthesia complications

## 2017-03-25 NOTE — Progress Notes (Signed)
SLP Cancellation Note  Patient Details Name: NAINIKA NEWLUN MRN: [id number] DOB: [birth year]   Cancelled treatment:       Reason Eval/Treat Not Completed: Patient at procedure or test/unavailable. Endoscopy today.    Chessie Neuharth, Katherene Ponto 03/25/2017, 8:06 AM

## 2017-03-25 NOTE — Progress Notes (Signed)
Alexandria [name] 12:45 PM  Subjective: Patient doing well without any new complaints and her case discussed with her sister is well and Dr. 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

## 2017-03-25 NOTE — Anesthesia Preprocedure Evaluation (Addendum)
Anesthesia Evaluation  Patient identified by MRN, date of birth, ID band Patient awake    Reviewed: Allergy & Precautions, NPO status , Patient's Chart, lab work & pertinent test results  History of Anesthesia Complications (+) PONV  Airway Mallampati: II  TM Distance: >3 FB Neck ROM: Full    Dental no notable dental hx. (+) Caps   Pulmonary neg pulmonary ROS, pneumonia,    Pulmonary exam normal breath sounds clear to auscultation       Cardiovascular hypertension, Pt. on medications negative cardio ROS Normal cardiovascular exam Rhythm:Regular Rate:Normal     Neuro/Psych PSYCHIATRIC DISORDERS negative neurological ROS  negative psych ROS   GI/Hepatic negative GI ROS, Neg liver ROS,   Endo/Other  negative endocrine ROS  Renal/GU negative Renal ROS  negative genitourinary   Musculoskeletal negative musculoskeletal ROS (+)   Abdominal   Peds negative pediatric ROS (+)  Hematology negative hematology ROS (+)   Anesthesia Other Findings   Reproductive/Obstetrics negative OB ROS                             Anesthesia Physical  Anesthesia Plan  ASA: III  Anesthesia Plan: MAC   Post-op Pain Management:    Induction: Intravenous  PONV Risk Score and Plan: 4 or greater and Ondansetron, Dexamethasone, Midazolam and Treatment may vary due to age or medical condition  Airway Management Planned: Nasal Cannula, Natural Airway and Mask  Additional Equipment:   Intra-op Plan:   Post-operative Plan:   Informed Consent: I have reviewed the patients History and Physical, chart, labs and discussed the procedure including the risks, benefits and alternatives for the proposed anesthesia with the patient or authorized representative who has indicated his/her understanding and acceptance.   Dental advisory given  Plan Discussed with: CRNA  Anesthesia Plan Comments:        Anesthesia  Quick Evaluation

## 2017-03-25 NOTE — Progress Notes (Signed)
PT Cancellation Note  Patient Details Name: Alexandria [name] MRN: [id number] DOB: 1945-10-21   Cancelled Treatment:    Reason Eval/Treat Not Completed: Patient at procedure or test/unavailable   Duncan Dull 03/25/2017, 12:07 PM Alben Deeds, PT DPT

## 2017-03-25 NOTE — Progress Notes (Signed)
Neurosurgery Progress Note  No NS issues overnight Complains of head pain today. Nursing to bring her medicine No other issues this am.  EXAM:  BP (!) 156/91   Pulse (!) 125   Temp 98.6 F (37 C) (Oral)   Resp (!) 32   Ht 5' 1.5" (1.562 m)   Wt 72.7 kg (160 lb 4.4 oz)   SpO2 91%   BMI 29.79 kg/m   Awake, alert, oriented  Speech fluent, appropriate  CN grossly intact  MAEW  IMPRESSION/PLAN 71 y.o. female s/p craniectomy for infected bone flap. Doing well from NS perspective. Does have some other medical issues being managed by GI/Gen Surg. Discussed with PCCM this am - they believe she would be okay with hospitalist management. If Gen Surgery okay with transfer to step down, will ok the transfer. Otherwise, nothing to do from a NS standpoint. Will discuss with hospitalist for transfer of care.

## 2017-03-25 NOTE — Progress Notes (Signed)
CRITICAL VALUE ALERT  Critical Value: Phosphorus less than 1   Date & Time Notied:  03/25/17 0725  Provider Notified: Neurosurgery  Orders Received/Actions taken: Awaiting orders.

## 2017-03-25 NOTE — Progress Notes (Signed)
Pharmacy Antibiotic Note  Alexandria [name] is a 71 y.o. female admitted on 03/17/2017  Wound infection at previous craniotomy site.  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 Also with possible aspiration PNA. Afebrile, WBC 11.2, SCr is stable at 0.57.   Plan: Continue meropenem 2g IV q8h Monitor clinical picture, renal function F/U ID recs, C&S, abx deescalation / LOT  Height: 5' 1.5" (156.2 cm) Weight: 160 lb 4.4 oz (72.7 kg) IBW/kg (Calculated) : 48.95  Temp (24hrs), Avg:98.7 F (37.1 C), Min:98.4 F (36.9 C), Max:99 F (37.2 C)   Recent Labs Lab 03/20/17 1106 03/21/17 0059 03/21/17 0348 03/21/17 0354 03/22/17 0449 03/22/17 2005 03/23/17 0339 03/24/17 0320 03/24/17 2107 03/25/17 0501  WBC 9.7  --   --  11.6* 13.2*  --  12.4* 11.2*  --   --   CREATININE 0.82  --   --  0.90 0.74 0.75 0.75 0.79 0.66 0.57  LATICACIDVEN  --  1.5 1.8  --  1.2  --   --   --   --   --     Estimated Creatinine Clearance: 59.6 mL/min (by C-G formula based on SCr of 0.57 mg/dL).    Allergies  Allergen Reactions  . Wasp Venom Anaphylaxis  . Ceftriaxone Rash    Pt developed truncal rash on Ceftriaxone, flagyl and also possibly still on keppra. It persisted for weeks with scarring. She did complete 2-3 days of abx while rash still going on. Dermatologist diagnosed as drug rash  . Keppra [Levetiracetam] Rash    Possible drug rash see other notes  . Flagyl [Metronidazole] Rash    Possible drug rash see other notes  . Sulfa Antibiotics Hives   Meropenem 8/15 >> Vancomycin 8/15 >> 8/17  8/14 blood x 2 cx: ngtd 8/15 abscess cx: Viridans Strep 8/18: TA Cx: few yeast (pending)  Salome Arnt, PharmD, BCPS 03/25/2017 10:05 AM

## 2017-03-25 NOTE — Progress Notes (Signed)
Orthopedic Tech Progress Note Patient Details:  Alexandria [name] Mar 09, 1946 [id number] Called [facility] for brace. Patient ID: Alexandria [name], female   DOB: 09/09/1945, 71 y.o.   MRN: [id number]   Braulio Bosch 03/25/2017, 6:18 PM

## 2017-03-25 NOTE — Progress Notes (Signed)
Patient ID: Alexandria [name], female   DOB: Nov 30, 1945, 71 y.o.   MRN: [id number]          [facility] for Infectious Disease  Date of Admission:  03/17/2017           Day 7 meropenem ASSESSMENT: She probably has some aspiration pneumonitis that no more fever to suggest evolving pneumonia. I will continue meropenem is planned for 5 more weeks for her subgaleal abscess.  PLAN: 1. 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 [MAR Hold] chlorhexidine  15 mL Mouth Rinse BID  . [MAR Hold] Chlorhexidine Gluconate Cloth  6 each Topical Daily  . [MAR Hold] heparin  5,000 Units Subcutaneous Q8H  . [MAR Hold] insulin aspart  0-9 Units Subcutaneous Q4H  . [MAR Hold] mouth rinse  15 mL Mouth Rinse q12n4p  . [MAR Hold] metoprolol succinate  50 mg Oral Daily  . [MAR Hold] pantoprazole (PROTONIX) IV  40 mg Intravenous Daily    SUBJECTIVE: She states that her headache is more noticeable and bothersome today. She still has a loose cough. She has not noted any shortness of breath when up walking to the bathroom. She's had some mild intermittent nausea but no vomiting.  Review of Systems: Review of Systems  Constitutional: Negative for chills, diaphoresis and fever.  Respiratory: Positive for cough. Negative for sputum production and shortness of breath.   Cardiovascular: Negative for chest pain.  Gastrointestinal: Positive for vomiting. Negative for abdominal pain, diarrhea and nausea.  Neurological: Positive for headaches.    Allergies  Allergen Reactions  . Wasp Venom Anaphylaxis  . Ceftriaxone Rash    Pt developed truncal rash on Ceftriaxone, flagyl and also possibly still on keppra.  It persisted for weeks with scarring. She did complete 2-3 days of abx while rash still going on. Dermatologist diagnosed as drug rash  . Keppra [Levetiracetam] Rash    Possible drug rash see other notes  . Flagyl [Metronidazole] Rash    Possible drug rash see other notes  . Sulfa Antibiotics Hives    OBJECTIVE: Vitals:   03/25/17 1000 03/25/17 1100 03/25/17 1200 03/25/17 1204  BP: (!) 145/91 (!) 159/94 (!) 171/100   Pulse: (!) 125 (!) 119 (!) 127 (!) 128  Resp: (!) 22 19 (!) 25 (!) 25  Temp:    98.2 F (36.8 C)  TempSrc:    Oral  SpO2: 91% 93% 95%   Weight:      Height:       Body mass index is 29.79 kg/m.  Physical Exam  Constitutional: She is oriented to person, place, and time.  She is in good spirits.  HENT:  The dressing over her left frontal incision is clean and dry.  Cardiovascular: Regular rhythm.   No murmur heard. She remains tachycardic.  Pulmonary/Chest: Effort normal. She has no wheezes.  No change in bibasilar crackles posteriorly.  Abdominal: Soft. There is no tenderness.  Neurological: She is alert and oriented to person, place, and time.  Skin: No rash noted.  Psychiatric: Mood and affect normal.    Lab Results Lab Results  Component Value Date   WBC 11.2 (H) 03/24/2017  HGB 9.7 (L) 03/24/2017   HCT 31.0 (L) 03/24/2017   MCV 85.9 03/24/2017   PLT 224 03/24/2017    Lab Results  Component Value Date   CREATININE 0.57 03/25/2017   BUN 7 03/25/2017   NA 142 03/25/2017   K 3.3 (L) 03/25/2017   CL 109 03/25/2017   CO2 27 03/25/2017    Lab Results  Component Value Date   ALT 12 (L) 03/25/2017   AST 17 03/25/2017   ALKPHOS 56 03/25/2017   BILITOT 0.6 03/25/2017     Microbiology: Recent Results (from the past 240 hour(s))  Culture, blood (routine x 2)     Status: None   Collection Time: 03/17/17  3:05 PM  Result Value Ref Range Status   Specimen Description BLOOD LEFT ANTECUBITAL  Final   Special Requests   Final    BOTTLES DRAWN  AEROBIC ONLY Blood Culture adequate volume   Culture NO GROWTH 5 DAYS  Final   Report Status 03/22/2017 FINAL  Final  Culture, blood (routine x 2)     Status: None   Collection Time: 03/17/17  3:11 PM  Result Value Ref Range Status   Specimen Description BLOOD LEFT ARM  Final   Special Requests   Final    BOTTLES DRAWN AEROBIC ONLY Blood Culture adequate volume   Culture NO GROWTH 5 DAYS  Final   Report Status 03/22/2017 FINAL  Final  MRSA PCR Screening     Status: None   Collection Time: 03/17/17 10:22 PM  Result Value Ref Range Status   MRSA by PCR NEGATIVE NEGATIVE Final    Comment:        The GeneXpert MRSA Assay (FDA approved for NASAL specimens only), is one component of a comprehensive MRSA colonization surveillance program. It is not intended to diagnose MRSA infection nor to guide or monitor treatment for MRSA infections.   Surgical PCR screen     Status: None   Collection Time: 03/18/17 10:51 AM  Result Value Ref Range Status   MRSA, PCR NEGATIVE NEGATIVE Final   Staphylococcus aureus NEGATIVE NEGATIVE Final    Comment:        The Xpert SA Assay (FDA approved for NASAL specimens in patients over 65 years of age), is one component of a comprehensive surveillance program.  Test performance has been validated by [facility] for patients greater than or equal to 26 year old. It is not intended to diagnose infection nor to guide or monitor treatment.   Fungus Culture With Stain     Status: None (Preliminary result)   Collection Time: 03/18/17  3:04 PM  Result Value Ref Range Status   Fungus Stain Final report  Final    Comment: (NOTE) Performed At: [facility] [place], [place] [id number] Lindon Romp MD [id number]    Fungus (Mycology) Culture PENDING  Incomplete   Fungal Source ABSCESS  Final    Comment: SUBGALEAL  Aerobic/Anaerobic Culture (surgical/deep wound)     Status: None   Collection Time: 03/18/17  3:04 PM  Result  Value Ref Range Status   Specimen Description ABSCESS SUBGALEAL  Final   Special Requests NONE  Final   Gram Stain   Final    ABUNDANT WBC PRESENT, PREDOMINANTLY PMN FEW GRAM POSITIVE COCCI IN PAIRS    Culture   Final    ABUNDANT VIRIDANS STREPTOCOCCUS SUSCEPTIBILITIES PERFORMED ON PREVIOUS CULTURE WITHIN THE LAST 5 DAYS. NO ANAEROBES ISOLATED    Report Status 03/24/2017 FINAL  Final  Fungus Culture Result     Status: None   Collection Time: 03/18/17  3:04 PM  Result Value Ref Range Status   Result 1 Comment  Final    Comment: (NOTE) KOH/Calcofluor preparation:  no fungus observed. Performed At: [facility] [place], [place] [id number] Lindon Romp MD SJ:[id number]   Fungus Culture With Stain     Status: None (Preliminary result)   Collection Time: 03/18/17  3:05 PM  Result Value Ref Range Status   Fungus Stain Final report  Final    Comment: (NOTE) Performed At: [facility] [place], [place] [id number] Lindon Romp MD [id number]    Fungus (Mycology) Culture PENDING  Incomplete   Fungal Source ABSCESS  Final    Comment: SUBGALEAL SPECIMEN B  Aerobic/Anaerobic Culture (surgical/deep wound)     Status: None   Collection Time: 03/18/17  3:05 PM  Result Value Ref Range Status   Specimen Description ABSCESS  Final   Special Requests SUBGALEAL SPECIMEN B  Final   Gram Stain   Final    FEW WBC PRESENT,BOTH PMN AND MONONUCLEAR RARE GRAM POSITIVE COCCI IN PAIRS    Culture   Final    FEW VIRIDANS STREPTOCOCCUS SUSCEPTIBILITIES PERFORMED ON PREVIOUS CULTURE WITHIN THE LAST 5 DAYS. NO ANAEROBES ISOLATED    Report Status 03/24/2017 FINAL  Final  Fungus Culture Result     Status: None   Collection Time: 03/18/17  3:05 PM  Result Value Ref Range Status   Result 1 Comment  Final    Comment: (NOTE) KOH/Calcofluor preparation:  no fungus observed. Performed At: [facility] [place], [place]  [id number] Lindon Romp MD RF:[id number]   Fungus Culture With Stain     Status: None (Preliminary result)   Collection Time: 03/18/17  3:06 PM  Result Value Ref Range Status   Fungus Stain Final report  Final    Comment: (NOTE) Performed At: [facility] [place], [place] [id number] Lindon Romp MD [id number]    Fungus (Mycology) Culture PENDING  Incomplete   Fungal Source ABSCESS  Final    Comment: SUGALEAL PHLEGMON SPECIMEN C  Aerobic/Anaerobic Culture (surgical/deep wound)     Status: None   Collection Time: 03/18/17  3:06 PM  Result Value Ref Range Status   Specimen Description ABSCESS  Final   Special Requests SUBGALEAL PHLEGMON,SPECIMEN C  Final   Gram Stain   Final    FEW WBC PRESENT,BOTH PMN AND MONONUCLEAR NO ORGANISMS SEEN    Culture   Final    FEW VIRIDANS STREPTOCOCCUS CRITICAL RESULT CALLED TO, READ BACK BY AND VERIFIED WITH: J Zurich Carreno,MD AT 1140 03/20/17 BY L BENFIELD CONCERNING GROWTH ON CULTURE NO ANAEROBES ISOLATED    Report Status 03/24/2017 FINAL  Final   Organism ID, Bacteria VIRIDANS STREPTOCOCCUS  Final      Susceptibility   Viridans streptococcus - MIC*    ERYTHROMYCIN >=8 RESISTANT Resistant     LEVOFLOXACIN 1 SENSITIVE Sensitive     VANCOMYCIN 0.5 SENSITIVE Sensitive     * FEW VIRIDANS STREPTOCOCCUS  Fungus Culture Result     Status: None   Collection Time: 03/18/17  3:06 PM  Result Value Ref Range Status   Result 1 Comment  Final    Comment: (NOTE) KOH/Calcofluor preparation:  no fungus observed. Performed At: [facility] [place], [place] [id number] Lindon Romp MD TG:[id number]   Fungus Culture With Stain  Status: None (Preliminary result)   Collection Time: 03/18/17  3:07 PM  Result Value Ref Range Status   Fungus Stain Final report  Final    Comment: (NOTE) Performed At: [facility] [place], [place] [id number] Lindon Romp MD IO:[id number]      Fungus (Mycology) Culture PENDING  Incomplete   Fungal Source BONE  Final    Comment: FLAP  Aerobic/Anaerobic Culture (surgical/deep wound)     Status: None   Collection Time: 03/18/17  3:07 PM  Result Value Ref Range Status   Specimen Description BONE  Final   Special Requests BONE FLAP  Final   Gram Stain   Final    ABUNDANT WBC PRESENT,BOTH PMN AND MONONUCLEAR NO ORGANISMS SEEN    Culture   Final    FEW VIRIDANS STREPTOCOCCUS CRITICAL RESULT CALLED TO, READ BACK BY AND VERIFIED WITH: J Benoit Meech,MD AT 1140 03/20/17 BY L BENFIELD CONCERNING GROWTH ON CULTURE NO ANAEROBES ISOLATED    Report Status 03/23/2017 FINAL  Final   Organism ID, Bacteria VIRIDANS STREPTOCOCCUS  Final      Susceptibility   Viridans streptococcus - MIC*    ERYTHROMYCIN >=8 RESISTANT Resistant     LEVOFLOXACIN 1 SENSITIVE Sensitive     VANCOMYCIN 0.5 SENSITIVE Sensitive     * FEW VIRIDANS STREPTOCOCCUS  Fungus Culture Result     Status: None   Collection Time: 03/18/17  3:07 PM  Result Value Ref Range Status   Result 1 Comment  Final    Comment: (NOTE) KOH/Calcofluor preparation:  no fungus observed. Performed At: [facility] [address] [place], [place] [id number] Lindon Romp MD IW:[id number]   Culture, respiratory (tracheal aspirate)     Status: None   Collection Time: 03/21/17  1:00 AM  Result Value Ref Range Status   Specimen Description TRACHEAL ASPIRATE  Final   Special Requests NONE  Final   Gram Stain FEW WBC PRESENT, PREDOMINANTLY PMN FEW YEAST   Final   Culture FEW CANDIDA ALBICANS  Final   Report Status 03/23/2017 FINAL  Final    Michel Bickers, MD [place] [facility] [phone] pager   [phone] cell 03/25/2017, 1:22 PM

## 2017-03-26 ENCOUNTER — Inpatient Hospital Stay (HOSPITAL_COMMUNITY): Payer: Medicare Other

## 2017-03-26 DIAGNOSIS — B9689 Other specified bacterial agents as the cause of diseases classified elsewhere: Secondary | ICD-10-CM

## 2017-03-26 DIAGNOSIS — Z882 Allergy status to sulfonamides status: Secondary | ICD-10-CM

## 2017-03-26 LAB — COMPREHENSIVE METABOLIC PANEL
ALBUMIN: 1.9 g/dL — AB (ref 3.5–5.0)
ALT: 11 U/L — AB (ref 14–54)
AST: 15 U/L (ref 15–41)
Alkaline Phosphatase: 54 U/L (ref 38–126)
Anion gap: 3 — ABNORMAL LOW (ref 5–15)
BUN: 9 mg/dL (ref 6–20)
CO2: 30 mmol/L (ref 22–32)
CREATININE: 0.55 mg/dL (ref 0.44–1.00)
Calcium: 8.2 mg/dL — ABNORMAL LOW (ref 8.9–10.3)
Chloride: 108 mmol/L (ref 101–111)
GFR calc Af Amer: >60 mL/min (ref >60)
Glucose, Bld: 136 mg/dL — ABNORMAL HIGH (ref 65–99)
POTASSIUM: 3.8 mmol/L (ref 3.5–5.1)
SODIUM: 141 mmol/L (ref 135–145)
TOTAL PROTEIN: 5 g/dL — AB (ref 6.5–8.1)
Total Bilirubin: 0.8 mg/dL (ref 0.3–1.2)

## 2017-03-26 LAB — GLUCOSE, CAPILLARY
GLUCOSE-CAPILLARY: 118 mg/dL — AB (ref 65–99)
GLUCOSE-CAPILLARY: 176 mg/dL — AB (ref 65–99)
Glucose-Capillary: 140 mg/dL — ABNORMAL HIGH (ref 65–99)
Glucose-Capillary: 153 mg/dL — ABNORMAL HIGH (ref 65–99)
Glucose-Capillary: 165 mg/dL — ABNORMAL HIGH (ref 65–99)

## 2017-03-26 LAB — MAGNESIUM: MAGNESIUM: 2.1 mg/dL (ref 1.7–2.4)

## 2017-03-26 LAB — PHOSPHORUS: Phosphorus: 1.6 mg/dL — ABNORMAL LOW (ref 2.5–4.6)

## 2017-03-26 MED ORDER — ALBUTEROL SULFATE (2.5 MG/3ML) 0.083% IN NEBU: 2.5000 mg | INHALATION_SOLUTION | Freq: Four times a day (QID) | RESPIRATORY_TRACT | Status: DC

## 2017-03-26 MED ORDER — JEVITY 1.2 CAL PO LIQD
1000.0000 mL | ORAL | Status: DC
  Administered 2017-03-26: 1000 mL
  Filled 2017-03-26 (×3): qty 1000

## 2017-03-26 MED ORDER — PRO-STAT SUGAR FREE PO LIQD
30.0000 mL | Freq: Every day | ORAL | Status: DC
  Administered 2017-03-26: 30 mL via ORAL

## 2017-03-26 MED ORDER — ERTAPENEM IV (FOR PTA / DISCHARGE USE ONLY): 1.0000 g | INTRAVENOUS | 0 refills | Status: AC

## 2017-03-26 MED ORDER — LABETALOL HCL 5 MG/ML IV SOLN
10.0000 mg | INTRAVENOUS | Status: DC | PRN
  Administered 2017-03-26 – 2017-03-27 (×3): 10 mg via INTRAVENOUS
  Filled 2017-03-26 (×2): qty 4

## 2017-03-26 MED ORDER — ALBUTEROL SULFATE (2.5 MG/3ML) 0.083% IN NEBU
2.5000 mg | INHALATION_SOLUTION | Freq: Four times a day (QID) | RESPIRATORY_TRACT | Status: DC
  Administered 2017-03-26: 2.5 mg via RESPIRATORY_TRACT
  Filled 2017-03-26: qty 3

## 2017-03-26 MED ORDER — TRACE MINERALS CR-CU-MN-SE-ZN 10-1000-500-60 MCG/ML IV SOLN
INTRAVENOUS | Status: DC
  Administered 2017-03-26: 18:00:00 via INTRAVENOUS
  Filled 2017-03-26: qty 1440

## 2017-03-26 MED ORDER — POTASSIUM PHOSPHATES 15 MMOLE/5ML IV SOLN
15.0000 mmol | Freq: Once | INTRAVENOUS | Status: AC
  Administered 2017-03-26: 15 mmol via INTRAVENOUS
  Filled 2017-03-26: qty 5

## 2017-03-26 MED ORDER — JEVITY 1.2 CAL PO LIQD: 1000.0000 mL | ORAL | 7 refills | Status: AC

## 2017-03-26 MED ORDER — LABETALOL HCL 5 MG/ML IV SOLN: 10.0000 mg | INTRAVENOUS | Status: DC | PRN

## 2017-03-26 NOTE — Progress Notes (Signed)
PHARMACY CONSULT NOTE FOR:  OUTPATIENT  PARENTERAL ANTIBIOTIC THERAPY (OPAT)  Indication: bacteremia   Regimen: ertapenem 1g q24 hours End date: 04/30/17  IV antibiotic discharge orders are pended. To discharging provider:  please sign these orders via discharge navigator,  Select New Orders & click on the button choice - Manage This Unsigned Work.     Thank you for allowing pharmacy to be a part of this patient's care.  Georgina Peer 03/26/2017, 3:21 PM

## 2017-03-26 NOTE — Progress Notes (Signed)
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

## 2017-03-26 NOTE — Discharge Summary (Signed)
Physician Discharge Summary  Patient ID: Alexandria [name] MRN: [id number] DOB/AGE: 1945-08-28 71 y.o.  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 [name]  Who was admitted on admission. 03/17/2017 due to a surgical site infection.  In March, she underwent craniotomy for evacuation of brain abscess by Dr. Vertell Limber.  She was doing well postoperatively until about 2 weeks prior to her her most recent  Admission.  She started to notice some drainage in swelling at the site of her previous surgery.  She was directly admitted for management.  Found to have a subgaleal abscess.  Underwent craniectomy on 03/18/2017 by Dr. Cyndy Freeze.   Dr. Megan Salon of infectious Disease was consulted for assistance with antibiotics.  Appreciate assistance. PIC placed. Cultures positive for viridans strep. Currently on Day 8 of meropenam, rec 5 more weeks for subgaleal abscess.  Postoperatively, she was doing well up until 03/20/2017.  That morning, she started having some nausea vomiting and diarrhea.  Abdominal x-ray was without evidence of obstruction and she had a normal CBC and BMP.  Unfortunately, she vomited later that evening and likely aspirated requiring intubation.  Further work up showed a large hiatal hernia causing gastric outlet obstruction.    GI and General surgery was subsequently consulted for assistance with management.  She was successfully extubated the following Monday.   She was treated conservatively  with an NG to.  She underwent endoscopy on 08/22.  Per Dr Donne Hazel yesterday, "endoscopy showed evidence of prior incarceration and twisting but passes through to duodenal Dr Watt Climes was able to pass feeding tube also, NG came out I think due to prealbumin of <5 and her current condition best plan as this is not urgent right now is to proceed with preop nutrition to try to improve this prior to surgery. I will start tube feeds tomorrow and see if we can get these better.  I would like to stop tna. I discussed this with her daughter and her today. I think 10-14 days likely before surgery as long as no urgency".  Today, tube feeding for started.  Dr. Margaret Pyle field went to see patient today and she decided that she would like to be transferred to [facility]   For further management. Dr Cyndy Freeze has been notified. Pt to be transferred to [facility].   Patient to follow-up for staple removal in about 7 days.   Treatments: Surgery - Left Frontal Cranial Wound Exploration with removal of bone flap (Left) - dr Marland Kitchen Ditty on 03/18/2017  Endoscopy 03/25/2017 by Dr Watt Climes  Discharge Exam: Blood pressure 112/77, pulse (!) 110, temperature 98.7 F (37.1 C), temperature source Oral, resp. rate 20, height 5' 1.5" (1.562 m), weight 72.8 kg (160 lb 7.9 oz), SpO2 99 %. Awake, alert, oriented Speech fluent, appropriate CN grossly intact 5/5 BUE/BLE Wound c/d/i  Disposition:  Discharge Instructions    Home infusion instructions [facility] May follow [place] Dosing Protocol; May administer Cathflo as needed to maintain patency of vascular access device.;  Flushing of vascular access device: per [facility] Protocol: 0.9% NaCl pre/post medica...    Complete by:  As directed    Instructions:  May follow [place] Dosing Protocol   Instructions:  May administer Cathflo as needed to maintain patency of vascular access device.   Instructions:  Flushing of vascular access device: per [facility] Protocol: 0.9% NaCl pre/post medication  administration and prn patency; Heparin 100 u/ml, 13m for implanted ports and Heparin 10u/ml, 5779mfor all other central venous catheters.   Instructions:  May follow [facility] Anaphylaxis Protocol for First Dose Administration in the home: 0.9% NaCl at 25-50 ml/hr to maintain IV access for protocol meds. Epinephrine 0.3 ml IV/IM PRN and Benadryl 25-50 IV/IM PRN s/s of anaphylaxis.   Instructions:  [facility] Coordinator (RN) to assist per patient IV care needs in the home PRN.     Allergies as of 03/26/2017      Reactions   Wasp Venom Anaphylaxis   Ceftriaxone Rash   Pt developed truncal rash on Ceftriaxone, flagyl and also possibly still on keppra. It persisted for weeks with scarring. She did complete 2-3 days of abx while rash still going on. Dermatologist diagnosed as drug rash   Keppra [levetiracetam] Rash   Possible drug rash see other notes   Flagyl [metronidazole] Rash   Possible drug rash see other notes   Sulfa Antibiotics Hives      Medication List    TAKE these medications   benazepril 20 MG tablet Commonly known as:  LOTENSIN Take 1 tablet (20 mg total) by mouth daily.   ertapenem IVPB Commonly known as:  INVANZ Inject 1 g into the vein daily. Indication:  bacteremia Last Day of Therapy:  04/30/17 Labs - Once weekly:  CBC/D and BMP, Labs - Every other week:  ESR and CRP   feeding supplement (JEVITY 1.2 CAL) Liqd Place 1,000 mLs into feeding tube daily.   metoprolol succinate 50 MG 24 hr tablet Commonly known as:  TOPROL XL Take 1 tablet (50 mg total) by mouth daily. Take with or immediately following a meal. What changed:  when to take this  additional instructions   pantoprazole 40 MG tablet Commonly known as:  PROTONIX Take 1 tablet (40 mg total) by mouth daily.   raloxifene 60 MG tablet Commonly known as:  EVISTA Take 60 mg by mouth daily.   saccharomyces boulardii 250 MG capsule Commonly known as:  FLORASTOR Take 250 mg by mouth 2 (two)  times daily.            Home Infusion Instuctions        Start     Ordered   03/26/17 0000  Home infusion instructions [facility] May follow ACPinetopsosing Protocol; May administer Cathflo as needed to maintain patency of vascular access device.; Flushing [facility] vascular [facility]: 0.9% NaCl pre/post medica...    Question Answer Comment  Instructions May follow ACLanaganosing Protocol   Instructions May administer Cathflo as needed to maintain patency of vascular access device.   Instructions Flushing of vascular access device: per [facility]: 0.9% NaCl pre/post medication administration and prn patency; Heparin 100 u/ml, 79m9mor implanted ports and Heparin 10u/ml, 79ml21mr all other central venous catheters.   Instructions May follow [facility] Anaphylaxis Protocol for First Dose Administration in the home: 0.9% NaCl at 25-50 ml/hr to maintain IV access for protocol meds. Epinephrine 0.3 ml IV/IM PRN and Benadryl 25-50 IV/IM PRN s/s  of anaphylaxis.   Instructions [facility] Infusion Coordinator (RN) to assist per patient IV care needs in the home PRN.      03/26/17 1526       Discharge Care Instructions        Start     Ordered   03/27/17 0000  Nutritional Supplements (FEEDING SUPPLEMENT, JEVITY 1.2 CAL,) LIQD  Every 24 hours     03/26/17 1526   03/26/17 0000  Home infusion instructions [facility] May follow [place] Dosing Protocol; May administer Cathflo as needed to maintain patency of vascular access device.; Flushing of vascular access device: per [facility] Protocol: 0.9% NaCl pre/post medica...    Question Answer Comment  Instructions May follow [place] Dosing Protocol   Instructions May administer Cathflo as needed to maintain patency of vascular access device.   Instructions Flushing of vascular access device: per [facility] Protocol: 0.9% NaCl pre/post medication administration and prn patency; Heparin 100 u/ml, 16m for implanted  ports and Heparin 10u/ml, 578mfor all other central venous catheters.   Instructions May follow [facility] Anaphylaxis Protocol for First Dose Administration in the home: 0.9% NaCl at 25-50 ml/hr to maintain IV access for protocol meds. Epinephrine 0.3 ml IV/IM PRN and Benadryl 25-50 IV/IM PRN s/s of anaphylaxis.   Instructions [facility] Infusion Coordinator (RN) to assist per patient IV care needs in the home PRN.      03/26/17 1526   03/26/17 0000  ertapenem ([facility]  Every 24 hours     03/26/17 1526     Follow-up Information    Ditty, BeKevan NyMD. Schedule an appointment as soon as possible for a visit in 7 day(s).   Specialty:  Neurosurgery Why:  Staple removal Contact information: [address] [phone]         Signed: COTraci Sermon/23/2018, 3:26 PM

## 2017-03-26 NOTE — Progress Notes (Signed)
Patient arrived to 3M02 from 4N. Patient alert and oriented. Cortrak in place and tolerating TF at this time. Denies pain or discomfort at this time. Family at bedside. Call bell in reach, bed in low position. Patient instructed to call for assistance.

## 2017-03-26 NOTE — Progress Notes (Signed)
Patient and family want to transfer to [facility] Will work on this Bandage c/d/i Awake, alert, oriented Moving all extremities well No active neurosurgical issues

## 2017-03-26 NOTE — Progress Notes (Signed)
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 Pt underwent another  Lcrani for osteomyeleitis and infection. Pt also with large abdominal hernia she is getting worked up for.    PT Comments    Pt progressing towards physical therapy goals. Was able to perform transfers with +2 assist mainly for balance support and management of lines. HR at rest was in upper 130's. OK to mobilize to [facility] per RN. HR max during session was 157 bpm. Pt was educated on 4 exercises to perform in supine to maximize LE strengthening without increasing HR significantly. Asked for HR parameters from RN and MD for next session. Will continue to follow.   Follow Up Recommendations  Home health PT;Supervision/Assistance - 24 hour (may progress and not need HHPT)     Equipment Recommendations  Rolling walker with 5" wheels (may progress and not need)    Recommendations for Other Services       Precautions / Restrictions Precautions Precautions: Fall Restrictions Weight Bearing Restrictions: No    Mobility  Bed Mobility Overal bed mobility: Needs Assistance Bed Mobility: Supine to Sit     Supine to sit: Min guard     General bed mobility comments: Close guard for safety as pt transitioned to/from EOB. Assist for management of lines only.   Transfers Overall transfer level: Needs assistance Equipment used: 1 person hand held assist Transfers: Sit to/from [facility] Sit to Stand: Min assist Stand pivot transfers: Min assist;+2 safety/equipment       General transfer comment: Assist for balance support and management of lines. Pt was limited to SPT to/from [facility] 2 increased HR.  Ambulation/Gait                 Stairs            Wheelchair  Mobility    Modified Rankin (Stroke Patients Only)       Balance Overall balance assessment: Needs assistance Sitting-balance support: Feet supported;No upper extremity supported Sitting balance-Leahy Scale: Good     Standing balance support: No upper extremity supported Standing balance-Leahy Scale: Fair                              Cognition Arousal/Alertness: Awake/alert Behavior During Therapy: WFL for tasks assessed/performed Overall Cognitive Status: Within Functional Limits for tasks assessed                                        Exercises  Ankle pumps x10; quad sets x10; glute sets x10; heel slides 2 sets of 5 with rest in between to manage HR.     General Comments General comments (skin integrity, edema, etc.): Pt with fingers up under dressing and scratching when PT arrived. Educated pt on infection prevention and maintaining bandage integrity.       Pertinent Vitals/Pain Pain Assessment: Faces Faces Pain Scale: Hurts a little bit Pain Location: General discomfort with mobility Pain Descriptors / Indicators: Operative site guarding;Discomfort Pain Intervention(s): Limited activity within patient's tolerance;Monitored during session;Repositioned    Home Living Family/patient expects to be discharged to:: Private residence Living Arrangements: Alone Available Help at Discharge: Family;Friend(s);Available 24 hours/day  Type of Home: House Home Access: Stairs to enter Entrance Stairs-Rails: None Home Layout: Two level;Able to live on main level with bedroom/bathroom;Full bath on main level Home Equipment: Shower seat - built in Additional Comments: Pt sister reporting pt's children should be able to stay with pt 24/7.     Prior Function Level of Independence: Independent      Comments: drives, teaches [employer]   PT Goals (current goals can now be found in the care plan section) Acute Rehab PT Goals Patient Stated Goal:  home PT Goal Formulation: With patient Time For Goal Achievement: 03/31/17 Potential to Achieve Goals: Good Progress towards PT goals: Progressing toward goals    Frequency    Min 3X/week      PT Plan Current plan remains appropriate    Co-evaluation              AM-PAC PT "6 Clicks" Daily Activity  Outcome Measure  Difficulty turning over in bed (including adjusting bedclothes, sheets and blankets)?: A Little Difficulty moving from lying on back to sitting on the side of the bed? : A Little Difficulty sitting down on and standing up from a chair with arms (e.g., wheelchair, bedside commode, etc,.)?: A Little Help needed moving to and from a bed to chair (including a wheelchair)?: A Little Help needed walking in hospital room?: A Lot Help needed climbing 3-5 steps with a railing? : A Lot 6 Click Score: 16    End of Session Equipment Utilized During Treatment: Gait belt;Oxygen (6Lo2 via [place]) Activity Tolerance: Treatment limited secondary to medical complications (Comment) (Tachycardia) Patient left: in chair;with call bell/phone within reach;with family/visitor present Nurse Communication: Mobility status PT Visit Diagnosis: Unsteadiness on feet (R26.81)     Time: 9417-4081 PT Time Calculation (min) (ACUTE ONLY): 14 min  Charges:  $Therapeutic Activity: 8-22 mins                    G Codes:       Rolinda Roan, PT, DPT Acute Rehabilitation Services Pager: [phone] 03/26/2017, 3:10 PM

## 2017-03-26 NOTE — Progress Notes (Signed)
Patient ID: Alexandria [name], female   DOB: Dec 26, 1945, 71 y.o.   MRN: [id number]          [facility] Infectious Disease  Date of Admission:  03/17/2017           Day 8 meropenem ASSESSMENT: She probably has some aspiration pneumonitis that no more fever to suggest superimposed pneumonia. I will continue meropenem is planned for 5 more weeks for her subgaleal abscess.  PLAN: 1. Continue meropenem  Diagnosis: Subgaleal brain abscess  Culture Result: Viridans strep  Allergies  Allergen Reactions  . Wasp Venom Anaphylaxis  . Ceftriaxone Rash    Pt developed truncal rash on Ceftriaxone, flagyl and also possibly still on keppra. It persisted for weeks with scarring. She did complete 2-3 days of abx while rash still going on. Dermatologist diagnosed as drug rash  . Keppra [Levetiracetam] Rash    Possible drug rash see other notes  . Flagyl [Metronidazole] Rash    Possible drug rash see other notes  . Sulfa Antibiotics Hives    OPAT Orders Discharge antibiotics: Per pharmacy protocol Change meropenem to ertapenem upon discharge for once daily dosing  Duration: 6 weeks End Date: 04/30/2017  [facility] Care Per Protocol:  Labs weekly while on IV antibiotics: _x_ CBC with differential _x_ BMP __ CMP __ CRP __ ESR __ Vancomycin trough  __ Please pull PIC at completion of IV antibiotics _x_ Please leave PIC in place until doctor has seen patient or been notified  Fax weekly labs to [phone]  Clinic Follow Up Appt: I will arrange follow-up in my clinic on 04/30/2017   Active Problems:   Surgical site infection   Abscess   Infection of craniotomy plate ([place])   Allergy to cephalosporin   History of DVT (deep vein thrombosis)   Aspiration pneumonia (HCC)   Aspiration into airway   Acute respiratory failure ([place])   Vomiting associated with bulimia nervosa with nausea   Hypoxemia   . albuterol  2.5 mg Nebulization Q6H WA  . chlorhexidine  15 mL  Mouth Rinse BID  . Chlorhexidine Gluconate Cloth  6 each Topical Daily  . heparin  5,000 Units Subcutaneous Q8H  . insulin aspart  0-9 Units Subcutaneous Q4H  . mouth rinse  15 mL Mouth Rinse q12n4p  . metoprolol succinate  50 mg Oral Daily  . pantoprazole (PROTONIX) IV  40 mg Intravenous Daily    SUBJECTIVE: She is feeling better today. She is not having any headaches. She's not had any nausea or vomiting. Her dry cough is unchanged.  Review of Systems: Review of Systems  Constitutional: Negative for chills, diaphoresis and fever.  Respiratory: Positive for cough. Negative for sputum production and shortness of breath.   Cardiovascular: Negative for chest pain.  Gastrointestinal: Negative for abdominal pain, diarrhea, nausea and vomiting.  Neurological: Negative for headaches.    Allergies  Allergen Reactions  . Wasp Venom Anaphylaxis  . Ceftriaxone Rash    Pt developed truncal rash on Ceftriaxone, flagyl and also possibly still on keppra. It persisted for weeks with scarring. She did complete 2-3 days of abx while rash still going on. Dermatologist diagnosed as drug rash  . Keppra [Levetiracetam] Rash    Possible drug rash see other notes  . Flagyl [Metronidazole] Rash    Possible drug rash see other notes  . Sulfa Antibiotics Hives    OBJECTIVE: Vitals:   03/26/17 0800 03/26/17 0857 03/26/17 0900 03/26/17 1000  BP: (!) 136/95  Marland Kitchen)  137/93 (!) 136/106  Pulse: (!) 132  (!) 124 (!) 133  Resp: (!) 26  (!) 24 (!) 25  Temp:  98.7 F (37.1 C)    TempSrc:  Oral    SpO2: (!) 87%  95% 97%  Weight:      Height:       Body mass index is 29.83 kg/m.  Physical Exam  Constitutional: She is oriented to person, place, and time.  She is in good spirits and joking with me about getting her new helmet.  HENT:  The dressing over her left frontal incision is clean and dry.  Cardiovascular: Normal rate and regular rhythm.   No murmur heard. Pulmonary/Chest: Effort normal. She has  no wheezes.  No change in bibasilar crackles posteriorly.  Abdominal: Soft. There is no tenderness.  Neurological: She is alert and oriented to person, place, and time.  Skin: No rash noted.  Psychiatric: Mood and affect normal.    Lab Results Lab Results  Component Value Date   WBC 11.2 (H) 03/24/2017   HGB 9.7 (L) 03/24/2017   HCT 31.0 (L) 03/24/2017   MCV 85.9 03/24/2017   PLT 224 03/24/2017    Lab Results  Component Value Date   CREATININE 0.55 03/26/2017   BUN 9 03/26/2017   NA 141 03/26/2017   K 3.8 03/26/2017   CL 108 03/26/2017   CO2 30 03/26/2017    Lab Results  Component Value Date   ALT 11 (L) 03/26/2017   AST 15 03/26/2017   ALKPHOS 54 03/26/2017   BILITOT 0.8 03/26/2017     Microbiology: Recent Results (from the past 240 hour(s))  Culture, blood (routine x 2)     Status: None   Collection Time: 03/17/17  3:05 PM  Result Value Ref Range Status   Specimen Description BLOOD LEFT ANTECUBITAL  Final   Special Requests   Final    BOTTLES DRAWN AEROBIC ONLY Blood Culture adequate volume   Culture NO GROWTH 5 DAYS  Final   Report Status 03/22/2017 FINAL  Final  Culture, blood (routine x 2)     Status: None   Collection Time: 03/17/17  3:11 PM  Result Value Ref Range Status   Specimen Description BLOOD LEFT ARM  Final   Special Requests   Final    BOTTLES DRAWN AEROBIC ONLY Blood Culture adequate volume   Culture NO GROWTH 5 DAYS  Final   Report Status 03/22/2017 FINAL  Final  MRSA PCR Screening     Status: None   Collection Time: 03/17/17 10:22 PM  Result Value Ref Range Status   MRSA by PCR NEGATIVE NEGATIVE Final    Comment:        The GeneXpert MRSA Assay (FDA approved for NASAL specimens only), is one component of a comprehensive MRSA colonization surveillance program. It is not intended to diagnose MRSA infection nor to guide or monitor treatment for MRSA infections.   Surgical PCR screen     Status: None   Collection Time: 03/18/17 10:51  AM  Result Value Ref Range Status   MRSA, PCR NEGATIVE NEGATIVE Final   Staphylococcus aureus NEGATIVE NEGATIVE Final    Comment:        The Xpert SA Assay (FDA approved for NASAL specimens in patients over 36 years of age), is one component of a comprehensive surveillance program.  Test performance has been validated by [facility] for patients greater than or equal to 42 year old. It is not intended to diagnose  infection nor to guide or monitor treatment.   Fungus Culture With Stain     Status: None (Preliminary result)   Collection Time: 03/18/17  3:04 PM  Result Value Ref Range Status   Fungus Stain Final report  Final    Comment: (NOTE) Performed At: [facility] [place], [place] [id number] Lindon Romp MD [id number]    Fungus (Mycology) Culture PENDING  Incomplete   Fungal Source ABSCESS  Final    Comment: SUBGALEAL  Aerobic/Anaerobic Culture (surgical/deep wound)     Status: None   Collection Time: 03/18/17  3:04 PM  Result Value Ref Range Status   Specimen Description ABSCESS SUBGALEAL  Final   Special Requests NONE  Final   Gram Stain   Final    ABUNDANT WBC PRESENT, PREDOMINANTLY PMN FEW GRAM POSITIVE COCCI IN PAIRS    Culture   Final    ABUNDANT VIRIDANS STREPTOCOCCUS SUSCEPTIBILITIES PERFORMED ON PREVIOUS CULTURE WITHIN THE LAST 5 DAYS. NO ANAEROBES ISOLATED    Report Status 03/24/2017 FINAL  Final  Fungus Culture Result     Status: None   Collection Time: 03/18/17  3:04 PM  Result Value Ref Range Status   Result 1 Comment  Final    Comment: (NOTE) KOH/Calcofluor preparation:  no fungus observed. Performed At: [facility] [place], [place] [id number] Lindon Romp MD QP:[id number]   Fungus Culture With Stain     Status: None (Preliminary result)   Collection Time: 03/18/17  3:05 PM  Result Value Ref Range Status   Fungus Stain Final report  Final    Comment: (NOTE) Performed At: [facility] [place], [place] [id number] Lindon Romp MD [id number]    Fungus (Mycology) Culture PENDING  Incomplete   Fungal Source ABSCESS  Final    Comment: SUBGALEAL SPECIMEN B  Aerobic/Anaerobic Culture (surgical/deep wound)     Status: None   Collection Time: 03/18/17  3:05 PM  Result Value Ref Range Status   Specimen Description ABSCESS  Final   Special Requests SUBGALEAL SPECIMEN B  Final   Gram Stain   Final    FEW WBC PRESENT,BOTH PMN AND MONONUCLEAR RARE GRAM POSITIVE COCCI IN PAIRS    Culture   Final    FEW VIRIDANS STREPTOCOCCUS SUSCEPTIBILITIES PERFORMED ON PREVIOUS CULTURE WITHIN THE LAST 5 DAYS. NO ANAEROBES ISOLATED    Report Status 03/24/2017 FINAL  Final  Fungus Culture Result     Status: None   Collection Time: 03/18/17  3:05 PM  Result Value Ref Range Status   Result 1 Comment  Final    Comment: (NOTE) KOH/Calcofluor preparation:  no fungus observed. Performed At: [facility] [place], [place] [id number] Lindon Romp MD [id number]   Fungus Culture With Stain     Status: None (Preliminary result)   Collection Time: 03/18/17  3:06 PM  Result Value Ref Range Status   Fungus Stain Final report  Final    Comment: (NOTE) Performed At: [facility] [place], [place] [id number] Lindon Romp MD XB:[id number]    Fungus (Mycology) Culture PENDING  Incomplete   Fungal Source ABSCESS  Final    Comment: SUGALEAL PHLEGMON SPECIMEN C  Aerobic/Anaerobic Culture (surgical/deep wound)     Status: None   Collection Time: 03/18/17  3:06 PM  Result Value Ref Range Status   Specimen Description ABSCESS  Final   Special Requests SUBGALEAL PHLEGMON,SPECIMEN C  Final  Gram Stain   Final    FEW WBC PRESENT,BOTH PMN AND MONONUCLEAR NO ORGANISMS SEEN    Culture   Final    FEW VIRIDANS STREPTOCOCCUS CRITICAL RESULT CALLED TO, READ BACK BY AND VERIFIED WITH: J Darice Vicario,MD AT 1140 03/20/17 BY L  BENFIELD CONCERNING GROWTH ON CULTURE NO ANAEROBES ISOLATED    Report Status 03/24/2017 FINAL  Final   Organism ID, Bacteria VIRIDANS STREPTOCOCCUS  Final      Susceptibility   Viridans streptococcus - MIC*    ERYTHROMYCIN >=8 RESISTANT Resistant     LEVOFLOXACIN 1 SENSITIVE Sensitive     VANCOMYCIN 0.5 SENSITIVE Sensitive     * FEW VIRIDANS STREPTOCOCCUS  Fungus Culture Result     Status: None   Collection Time: 03/18/17  3:06 PM  Result Value Ref Range Status   Result 1 Comment  Final    Comment: (NOTE) KOH/Calcofluor preparation:  no fungus observed. Performed At: [facility] [place], [place] [id number] Lindon Romp MD XN:[id number]   Fungus Culture With Stain     Status: None (Preliminary result)   Collection Time: 03/18/17  3:07 PM  Result Value Ref Range Status   Fungus Stain Final report  Final    Comment: (NOTE) Performed At: [facility] [place], [place] [id number] Lindon Romp MD [id number]    Fungus (Mycology) Culture PENDING  Incomplete   Fungal Source BONE  Final    Comment: FLAP  Aerobic/Anaerobic Culture (surgical/deep wound)     Status: None   Collection Time: 03/18/17  3:07 PM  Result Value Ref Range Status   Specimen Description BONE  Final   Special Requests BONE FLAP  Final   Gram Stain   Final    ABUNDANT WBC PRESENT,BOTH PMN AND MONONUCLEAR NO ORGANISMS SEEN    Culture   Final    FEW VIRIDANS STREPTOCOCCUS CRITICAL RESULT CALLED TO, READ BACK BY AND VERIFIED WITH: J Tedford Berg,MD AT 1140 03/20/17 BY L BENFIELD CONCERNING GROWTH ON CULTURE NO ANAEROBES ISOLATED    Report Status 03/23/2017 FINAL  Final   Organism ID, Bacteria VIRIDANS STREPTOCOCCUS  Final      Susceptibility   Viridans streptococcus - MIC*    ERYTHROMYCIN >=8 RESISTANT Resistant     LEVOFLOXACIN 1 SENSITIVE Sensitive     VANCOMYCIN 0.5 SENSITIVE Sensitive     * FEW VIRIDANS STREPTOCOCCUS  Fungus Culture Result     Status:  None   Collection Time: 03/18/17  3:07 PM  Result Value Ref Range Status   Result 1 Comment  Final    Comment: (NOTE) KOH/Calcofluor preparation:  no fungus observed. Performed At: [facility] [address] [place], [place] [id number] Lindon Romp MD [id number]   Culture, respiratory (tracheal aspirate)     Status: None   Collection Time: 03/21/17  1:00 AM  Result Value Ref Range Status   Specimen Description TRACHEAL ASPIRATE  Final   Special Requests NONE  Final   Gram Stain FEW WBC PRESENT, PREDOMINANTLY PMN FEW YEAST   Final   Culture FEW CANDIDA ALBICANS  Final   Report Status 03/23/2017 FINAL  Final    Michel Bickers, MD [place] [facility] [phone] pager   [phone] cell 03/26/2017, 1:10 PM

## 2017-03-26 NOTE — Progress Notes (Signed)
SLP Cancellation Note  Patient Details Name: JENNAYA POGUE MRN: [id number] DOB: 1946-02-01   Cancelled treatment:       Reason Eval/Treat Not Completed: Other (comment). Not clear if pt to proceed with SLP assessment/Po trials.  Discussed with RN. MD please clarify or d/c SLP orders if no further assessment needed.    Milissa Fesperman, Katherene Ponto 03/26/2017, 12:18 PM

## 2017-03-26 NOTE — Progress Notes (Signed)
1 Day Post-Op    WU:[id number] and vomiting with large hiatal hernia   Subjective: Doing well after EGD, feeding tube in place.  No nausea since the loss of the NG.    Objective: Vital signs in last 24 hours: Temp:  [97.9 F (36.6 C)-98.7 F (37.1 C)] 98.7 F (37.1 C) (08/23 0857) Pulse Rate:  [111-132] 124 (08/23 0900) Resp:  [18-33] 24 (08/23 0900) BP: (126-178)/(76-111) 137/93 (08/23 0900) SpO2:  [86 %-96 %] 95 % (08/23 0900) Weight:  [72.8 kg (160 lb 7.9 oz)] 72.8 kg (160 lb 7.9 oz) (08/23 0500) Last BM Date: 03/24/17 NPO IV 2176 Urine 500 NG 200 - lost in Endoscopy after placement of the FT.   Afebrile, VSS, tachycardic    Intake/Output from previous day: 08/22 0701 - 08/23 0700 In: 2176.3 [I.V.:1666.3; IV Piggyback:510] Out: 700 [Urine:500; Emesis/NG output:200] Intake/Output this shift: Total I/O In: 100 [I.V.:100] Out: -   General appearance: alert, cooperative and no distress GI: soft, non-tender; bowel sounds normal; no masses,  no organomegaly  Lab Results:   Recent Labs  03/24/17 0320  WBC 11.2*  HGB 9.7*  HCT 31.0*  PLT 224    BMET  Recent Labs  03/25/17 0501 03/26/17 0440  NA 142 141  K 3.3* 3.8  CL 109 108  CO2 27 30  GLUCOSE 151* 136*  BUN 7 9  CREATININE 0.57 0.55  CALCIUM 8.2* 8.2*   PT/INR No results for input(s): LABPROT, INR in the last 72 hours.   Recent Labs Lab 03/21/17 0107 03/25/17 0501 03/26/17 0440  AST 20 17 15   ALT 12* 12* 11*  ALKPHOS 47 56 54  BILITOT 0.6 0.6 0.8  PROT 5.1* 5.5* 5.0*  ALBUMIN 2.6* 2.0* 1.9*     Lipase     Component Value Date/Time   LIPASE 25 03/21/2017 0107     Medications: . chlorhexidine  15 mL Mouth Rinse BID  . Chlorhexidine Gluconate Cloth  6 each Topical Daily  . heparin  5,000 Units Subcutaneous Q8H  . insulin aspart  0-9 Units Subcutaneous Q4H  . mouth rinse  15 mL Mouth Rinse q12n4p  . metoprolol succinate  50 mg Oral Daily  . pantoprazole (PROTONIX) IV  40 mg  Intravenous Daily   Anti-infectives    Start     Dose/Rate Route Frequency Ordered Stop   03/19/17 1200  meropenem (MERREM) 2 g in sodium chloride 0.9 % 100 mL IVPB     2 g 200 mL/hr over 30 Minutes Intravenous Every 8 hours 03/19/17 1109     03/19/17 1200  vancomycin (VANCOCIN) IVPB 750 mg/150 ml premix  Status:  Discontinued     750 mg 150 mL/hr over 60 Minutes Intravenous Every 12 hours 03/19/17 1109 03/20/17 1351   03/18/17 1532  bacitracin 50,000 Units in sodium chloride irrigation 0.9 % 500 mL irrigation  Status:  Discontinued       As needed 03/18/17 1533 03/18/17 1555   03/18/17 1530  meropenem (MERREM) 1 g in sodium chloride 0.9 % 100 mL IVPB     1 g 200 mL/hr over 30 Minutes Intravenous To Surgery 03/18/17 1516 03/18/17 1544   03/18/17 1524  vancomycin (VANCOCIN) powder  Status:  Discontinued       As needed 03/18/17 1524 03/18/17 1555   03/18/17 1500  ceFEPIme (MAXIPIME) 2 g in dextrose 5 % 50 mL IVPB  Status:  Discontinued     2 g 100 mL/hr over 30 Minutes Intravenous To Surgery  03/18/17 1459 03/18/17 1512   03/18/17 1428  vancomycin (VANCOCIN) 1-5 GM/200ML-% IVPB    Comments:  Trixie Deis   : cabinet override      03/18/17 1428 03/19/17 0229      Assessment/Plan CC:  Nausea/vomiting Large hiatal hernia with likely gastric outlet obstruction EGD 03/25/17:   - Congested mucosa in the esophagusand cardia - Gastritis. linear probably ischemic from 20 sore compression  - Duodenal diverticulum.   - The examination was otherwise normal. - Feeding tube placement was successfully  performed. confirmed with fluoroscopy and injecting Gastrografin - No specimens collected. unfortunately NG tube was   pulled back with withdrawing the scope and was removed.    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 Cyndy Freeze Sepsis 2/2 post-operative subgaleal abscess and osteomyelitiss/p left frontal cranial wound exploration and removal bone 8/15 Aspiration pneumonia Acute hypoxic respiratory  failure - extubated 8/20 Severe malnutrition  - prealbumin <5 Elevated troponin Hypertension GERD Hyperglycemia  Hypokalemia - being replaced hypophosphatasemia  FEN: IV fluids/TNA ID: Vancomycin 8/15-8/17, Maxipime  8/15, meropenem 8/15 =>> day 8 DVT:  Heparin     Plan:  Start Tube feeds today at 20 ml/hr.  It is very important that she not aspirate again.  I told her if she has any nausea to let the nursing staff know so they can stop the tube feeding and contact us.  I also told the nursing staff, but she may transfer.  Dr. Watt Climes told me that if we fine it necessary to place an NG via fluro, we could also ask them to put a wire in the current tube and put a new and better feeding tube more distally in the SB.       LOS: 8 days    Kaylor Simenson 03/26/2017 [phone]

## 2017-03-26 NOTE — Progress Notes (Signed)
Nutrition Follow-up  DOCUMENTATION CODES:   Not applicable  INTERVENTION:   -Tube Feeding:   Begin Jevity 1.2 at rate of 20 ml/hr via G-tube. Goal rate is 55 ml/hr with Pro-Stat 30 mL daily providing 1684 kcals, 88 g of protein, 1070 mL of free water  -TPN per Pharmacy  -Recommend continuing to closely monitor potassium, magnesium, phosphorus   NUTRITION DIAGNOSIS:   Inadequate oral intake related to inability to eat as evidenced by NPO status.  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 Presented on 8/14 with signs of sepsis and increased drainage from her wound found to have subgaleal abscess. Taken to the OR on 8/15 for Lt frontal cranial wound exploration and removal of bone flap. Hospital course complicated by nausea, vomiting and diarrhea. Pt with Acute hypoxic respiratory failure due to aspiration.  8/20 Extubated 8/22 EGD with G-tube placement, gastritis noted; NG tube removed 8/22 TPN initiated  G-tube placed due to questionable GOO related to paraesophageal hernia  5%AA/15%Dextrose initiated yesterday at rate of 40 ml/hr yesterday, plan to increase to 60 ml/hr today.  Per RD consult, plan to start TF at rate of 20 ml/hr. If tolerating, plan to advance to goal rate and d/c TPN  No N/V post NG tube removal  Nutritional needs re-estimated as pt no longer on vent support  Labs: phosphorus 1.6 (supplemented) Meds: reviewed  Diet Order:  Diet NPO time specified .TPN (CLINIMIX-E) Adult .TPN (CLINIMIX-E) Adult  Skin:  Wound (see comment) (Open wound/incision on head)  Last BM:  8/16  Height:   Ht Readings from Last 1 Encounters:  03/18/17  5' 1.5" (1.562 m)    Weight:   Wt Readings from Last 1 Encounters:  03/26/17 160 lb 7.9 oz (72.8 kg)    Ideal Body Weight:  48.86 kg  BMI:  Body mass index is 29.83 kg/m.  Estimated Nutritional Needs:   Kcal:  1650-1830 kcals  Protein:  83-92 g   Fluid:  >/= 1.8 L  EDUCATION NEEDS:   No education needs identified at this time  [place], [place], LDN [phone] Pager  [phone] Weekend/On-Call Pager

## 2017-03-26 NOTE — Progress Notes (Signed)
No NS at present. She is cleared from our perspective.  All issues are gen surger/med at this point Reviewed notes from ID, Gen Surg/Endoscopy Continue meropenem per ID x5 weeks for subgaleal abscess. Will discuss with Gen Surgery plan of care at this point.

## 2017-03-26 NOTE — Progress Notes (Signed)
Patient vomited x3 small amounts of dark brown emesis. Notified Dr. Grandville Silos. Instructed to hold TF at this time.

## 2017-03-26 NOTE — Progress Notes (Signed)
PHARMACY - ADULT TOTAL PARENTERAL NUTRITION CONSULT NOTE   Pharmacy Consult for TPN Indication: Intolerance of enteral feeding  Patient Measurements: Height: 5' 1.5" (156.2 cm) Weight: 160 lb 7.9 oz (72.8 kg) IBW/kg (Calculated) : 48.95 TPN AdjBW (KG): 54.4 Body mass index is 29.83 kg/m.   Assessment:  71yo female admitted 8/14 s/p outpt clinic wound check with swelling at craniotomy site; pt s/p craniotomy 10/27/16 for brain abscess, MRI in July with no recurrent abscess.   She returned to OR on 8/15 for cranial wound exploration and removal of bone flap, and has since been NPO with N/V/diarrhea.  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   Clear liquids were ordered 8/15 and then advanced to Regular diet on 8/16, which pt did not tolerate due to abdominal pain and repeated nausea/vomiting and was subsequently made NPO on 8/17.  Pt has had little to no intake x 7 days and started TPN on 8/21.  GI: GOO. Upper endoscopy 8/22 with feeding tube placement, might start TF at very slow rate. Remains NPO currently. NG tube with 265mL out (improved). on PPI-IV  Endo:  No hx DM, on sensitive SSI, CBGs 120 - 150, Insulin requirements in the past 24 hours: 4 units  Lytes: 3.3 > 3.8 - K runs x 8 total yesterday, Mg 2.1, corrected Ca 9.8, phos 1 > 1.6 received Kphos 30 mmol yesterday,  Renal:  Cr 0.55, stable. UOP 0.3 mL/kg/hr (might not record all UOP).  NS at 10 ml/hr Pulm: 4L O2, (extubated 8/20).  Albuterol nebs, mucomyst nebs x 72hr (8/20 >> ) Cards:   BP OK, tachy. 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  WBC 11.2, afebrile  Best Practices: mouth care, heparin SQ TPN Access:  CVL- double lumen (8/16) TPN start date: 8/21  Nutritional  Goals (per RD recommendation on 8/18): RD note:  Eating well pta but 9% wt loss in 4 mo KCal:  1293 kcal, will go with goal 1250-1350 kcal  Protein:  100-110g  Current Nutrition:  NPO, continue on TPN  Plan:  Clinimix 5/15-E at 60, advance slowly d/t concern of refeeding, currently meets ~ 80% goal Add MVI and Trace Elements to tpn No Lipid emulsion x 7 days in ICU pt per ASPEN guidelines (day #3/7 8/21) Continue Sensitive SSI q4 TPN labs qMon/Thurs F/U plans for G-tube and ability to transition to tube feedings Kphos 15 mmol IV x 1 F/u K and Phos (BMet, Phos in AM)  Maryanna Shape, PharmD, BCPS  Clinical Pharmacist  Pager: [phone]

## 2017-03-26 NOTE — Anesthesia Postprocedure Evaluation (Signed)
Anesthesia Post Note  Patient: Alexandria [name]  Procedure(s) Performed: Procedure(s) (LRB): ESOPHAGOGASTRODUODENOSCOPY (EGD) WITH PROPOFOL (N/A)     Patient location during evaluation: PACU Anesthesia Type: MAC Level of consciousness: awake and alert Pain management: pain level controlled Vital Signs Assessment: post-procedure vital signs reviewed and stable Respiratory status: spontaneous breathing, nonlabored ventilation, respiratory function stable and patient connected to nasal cannula oxygen Cardiovascular status: stable and blood pressure returned to baseline Anesthetic complications: no    Last Vitals:  Vitals:   03/26/17 0900 03/26/17 1000  BP: (!) 137/93 (!) 136/106  Pulse: (!) 124 (!) 133  Resp: (!) 24 (!) 25  Temp:    SpO2: 95% 97%    Last Pain:  Vitals:   03/26/17 0857  TempSrc: Oral  PainSc:                  Alaila Pillard

## 2017-03-26 NOTE — Progress Notes (Signed)
Orthopedic Tech Progress Note Patient Details:  Alexandria [name] [birth year] [id number]  Patient ID: Alexandria [name], female   DOB: Nov 01, 1945, 71 y.o.   MRN: [id number]   Hildred Priest 03/26/2017, 8:56 AM Called in bio-tech brace order ; spoke with Ronalee Belts

## 2017-03-26 NOTE — Progress Notes (Signed)
Called by RNs to assist with NG tube placement.  Patient was alert, oriented, updated me on her condition, felt nauseated and abdominal pressures pain was very uncomfortable.    I reviewed the RN that an order was present, reviewed procedure to patient and family, patient already had a Cortak in the Right Nare which needed to stay in so I placed a 14Fr NG in the Left Nare, + auscultation, + bile, placed to suction 1L decompressed immediately, KUB ordered, per results, NG was coiled in the stomach, I spoke with the Radiologist, pulled back 5-6 cm, + auscultation, awaiting read out from radiologist.   VSS, I explained to RN to measure the length (external) after KUB as well.   After placement, patient had relief immediately, Primary RN did call GI MD on call to inform them that patient decompressed 1L.  Call RR RN if needed.   Start Time 2019  End Time 2150

## 2017-03-26 NOTE — Progress Notes (Signed)
Family requesting transfer to [facility].  I have printed a transfer packet, should pt receive a bed assignment this evening.  If [facility] calls, should ask if facility plans to transport, or should we arrange [facility] transport.  Attending MD will need to complete EMTALA form in EPIC.  Please get accepting MD and room # should [facility] transport be needed, as they will ask for this.    Reinaldo Raddle, RN, BSN  Trauma/Neuro ICU Case Manager [phone]

## 2017-03-27 ENCOUNTER — Inpatient Hospital Stay (HOSPITAL_COMMUNITY): Payer: Medicare Other

## 2017-03-27 LAB — BASIC METABOLIC PANEL
ANION GAP: 8 (ref 5–15)
BUN: 12 mg/dL (ref 6–20)
CALCIUM: 8.7 mg/dL — AB (ref 8.9–10.3)
CO2: 30 mmol/L (ref 22–32)
Chloride: 101 mmol/L (ref 101–111)
Creatinine, Ser: 0.59 mg/dL (ref 0.44–1.00)
GLUCOSE: 488 mg/dL — AB (ref 65–99)
POTASSIUM: 3.9 mmol/L (ref 3.5–5.1)
SODIUM: 139 mmol/L (ref 135–145)

## 2017-03-27 LAB — GLUCOSE, CAPILLARY
Glucose-Capillary: 125 mg/dL — ABNORMAL HIGH (ref 65–99)
Glucose-Capillary: 133 mg/dL — ABNORMAL HIGH (ref 65–99)
Glucose-Capillary: 135 mg/dL — ABNORMAL HIGH (ref 65–99)
Glucose-Capillary: 139 mg/dL — ABNORMAL HIGH (ref 65–99)
Glucose-Capillary: 147 mg/dL — ABNORMAL HIGH (ref 65–99)

## 2017-03-27 LAB — PHOSPHORUS: PHOSPHORUS: 3.8 mg/dL (ref 2.5–4.6)

## 2017-03-27 NOTE — Progress Notes (Signed)
Events of last night and discussed with the patient and her sister and she's transferring to [facility] and unfortunately her feeding tube has come out but replacing her NG tube has helped and she is no longer having pain nausea or vomiting and she has no new complaints and I wished her well and hopefully things go well and [place] pe: Vital signs stable afebrile no acute distress patient looks good abdomen is soft nontender occasional bowel sounds chemistry stable no CBC Assessment: Recurrent nausea and vomiting probably from paraesophageal hiatal hernia Plan: Hopefully I will did follow-up as to what was done at the [facility] and will be on standby to help up here

## 2017-03-27 NOTE — Progress Notes (Signed)
2 Days Post-Op   Subjective/Chief Complaint: Ng back in overnight, I think feeding tube got pulled during this unfortunately as what was in ng was not the tube feeds, tachycardic this am, no chest pain   Objective: Vital signs in last 24 hours: Temp:  [97.5 F (36.4 C)-98.5 F (36.9 C)] 98.5 F (36.9 C) (08/24 0751) Pulse Rate:  [102-145] 121 (08/24 0800) Resp:  [16-30] 24 (08/24 0800) BP: (106-172)/(71-106) 172/93 (08/24 0800) SpO2:  [92 %-100 %] 94 % (08/24 0800) Weight:  [72 kg (158 lb 11.7 oz)-72.3 kg (159 lb 6.3 oz)] 72 kg (158 lb 11.7 oz) (08/24 0500) Last BM Date: 03/24/17  Intake/Output from previous day: 08/23 0701 - 08/24 0700 In: 468.7 [I.V.:450; NG/GT:18.7] Out: 1000 [Emesis/NG output:1000] Intake/Output this shift: Total I/O In: -  Out: 450 [Urine:450]  GI: soft nt  Lab Results:  No results for input(s): WBC, HGB, HCT, PLT in the last 72 hours. BMET  Recent Labs  03/26/17 0440 03/27/17 0436  NA 141 139  K 3.8 3.9  CL 108 101  CO2 30 30  GLUCOSE 136* 488*  BUN 9 12  CREATININE 0.55 0.59  CALCIUM 8.2* 8.7*   PT/INR No results for input(s): LABPROT, INR in the last 72 hours. ABG No results for input(s): PHART, HCO3 in the last 72 hours.  Invalid input(s): PCO2, PO2  Studies/Results: Dg Abd 1 View - Kub  Result Date: 03/25/2017 CLINICAL DATA:  Feeding tube insertion EXAM: DG C-ARM 61-120 MIN; ABDOMEN - 1 VIEW COMPARISON:  None FLUOROSCOPY TIME:  2 minutes 16 seconds FINDINGS: Multiple fluoroscopic images are provided. Interval placement of a percutaneous gastrostomy tube. Hand injection of contrast through the feeding tube demonstrates opacification of the stomach and duodenum. IMPRESSION: Hand injection of contrast through the feeding tube demonstrates opacification of the stomach and duodenum. Electronically Signed   By: Kathreen Devoid   On: 03/25/2017 15:01   Dg Abd Portable 1v  Result Date: 03/26/2017 CLINICAL DATA:  NG tube placement EXAM:  PORTABLE ABDOMEN - 1 VIEW COMPARISON:  03/26/2017 FINDINGS: Enteric tube remains looped in the stomach with its tip at the GE junction pointed toward the distal esophagus. Paucity of bowel gas. IMPRESSION: Enteric tube remains looped in the stomach with its tip at the GE junction pointed towards the distal esophagus. These results will be called to the ordering clinician or representative by the Radiologist Assistant, and communication documented in the PACS or zVision Dashboard. Electronically Signed   By: Julian Hy M.D.   On: 03/26/2017 22:13   Dg Abd Portable 1v  Result Date: 03/26/2017 CLINICAL DATA:  Status post NG tube placement. EXAM: PORTABLE ABDOMEN - 1 VIEW COMPARISON:  Abdominal radiograph 03/23/2017 FINDINGS: Enteric tube is coiled within the stomach. Relative paucity of small bowel gas. Lumbar spine degenerative changes. Degenerative changes left-greater-than-right hip joints. IMPRESSION: NG tube coiled within the stomach. Electronically Signed   By: Lovey Newcomer M.D.   On: 03/26/2017 21:14   Dg C-arm 1-60 Min  Result Date: 03/25/2017 CLINICAL DATA:  Feeding tube insertion EXAM: DG C-ARM 61-120 MIN; ABDOMEN - 1 VIEW COMPARISON:  None FLUOROSCOPY TIME:  2 minutes 16 seconds FINDINGS: Multiple fluoroscopic images are provided. Interval placement of a percutaneous gastrostomy tube. Hand injection of contrast through the feeding tube demonstrates opacification of the stomach and duodenum. IMPRESSION: Hand injection of contrast through the feeding tube demonstrates opacification of the stomach and duodenum. Electronically Signed   By: Kathreen Devoid   On: 03/25/2017  15:01    Anti-infectives: Anti-infectives    Start     Dose/Rate Route Frequency Ordered Stop   03/26/17 0000  ertapenem [facility]) IVPB     1 g Intravenous Every 24 hours 03/26/17 1526 04/30/17 2359   03/19/17 1200  meropenem (MERREM) 2 g in sodium chloride 0.9 % 100 mL IVPB     2 g 200 mL/hr over 30 Minutes Intravenous  Every 8 hours 03/19/17 1109 04/30/17 2359   03/19/17 1200  vancomycin (VANCOCIN) IVPB 750 mg/150 ml premix  Status:  Discontinued     750 mg 150 mL/hr over 60 Minutes Intravenous Every 12 hours 03/19/17 1109 03/20/17 1351   03/18/17 1532  bacitracin 50,000 Units in sodium chloride irrigation 0.9 % 500 mL irrigation  Status:  Discontinued       As needed 03/18/17 1533 03/18/17 1555   03/18/17 1530  meropenem (MERREM) 1 g in sodium chloride 0.9 % 100 mL IVPB     1 g 200 mL/hr over 30 Minutes Intravenous To Surgery 03/18/17 1516 03/18/17 1544   03/18/17 1524  vancomycin (VANCOCIN) powder  Status:  Discontinued       As needed 03/18/17 1524 03/18/17 1555   03/18/17 1500  ceFEPIme (MAXIPIME) 2 g in dextrose 5 % 50 mL IVPB  Status:  Discontinued     2 g 100 mL/hr over 30 Minutes Intravenous To Surgery 03/18/17 1459 03/18/17 1512   03/18/17 1428  vancomycin (VANCOCIN) 1-5 GM/200ML-% IVPB    Comments:  Trixie Deis   : cabinet override      03/18/17 1428 03/19/17 0229      Assessment/Plan: PEH  Will check cxr to make sure there is no tube left in her per her concern, I think it just came out They said [facility] are going to [facility] today I still think this needs repair and her pulmonary status and ntn are concern. If these are better I think will have better long term repair an tolerate better although waiting may end up not being possible.  [facility] 03/27/2017

## 2017-03-27 NOTE — Progress Notes (Signed)
Orthopedic Tech Progress Note Patient Details:  Alexandria [name] Jan 13, 1946 [id number]  Ortho Devices Type of Ortho Device: Other (comment) Ortho Device/Splint Location: Floor nurse called to find out the status with pt Helmet order.  Notes state Helmet order was called in a couple of days ago to [employer].  I followed up today with [employer] and they stated the Helment Just arrived today.  (Jasmine from [employer] stated she will contact [facility] floor nurse and get the forwarding information for pt so they may deliver it to the hospital the pt is going to) -----------------------------------  Floor nurse called to find out the status with pt Helmet order.  Notes state Helmet order was called in a couple of days ago to [employer].  I followed up today with [employer] and they stated the Helment Just arrived today.  (Jasmine from [facility] stated she will contact [facility] floor nurse and get the forwarding information for pt so [facility] may deliver the Helmet to the hospital the pt is going to)  Kristopher Oppenheim 03/27/2017, 1:09 PM

## 2017-03-27 NOTE — Care Management Important Message (Signed)
Important Message  Patient Details  Name: Alexandria [name] MRN: [id number] Date of Birth: [birth year]   Medicare Important Message Given:  Yes    Nathen May 03/27/2017, 10:42 AM

## 2017-03-27 NOTE — Progress Notes (Signed)
SLP Cancellation Note  Patient Details Name: Alexandria [name] MRN: [id number] DOB: Jan 21, 1946   Cancelled treatment:       Reason Eval/Treat Not Completed: Medical issues which prohibited therapy. Given ongoing GI issues will sign off at this time. If SLP interventions are needed, please reorder.    Amante Fomby, Katherene Ponto 03/27/2017, 7:55 AM

## 2017-03-27 NOTE — Progress Notes (Signed)
PT Cancellation Note  Patient Details Name: Alexandria [name] MRN: [id number] DOB: Aug 28, 1945   Cancelled Treatment:    Reason Eval/Treat Not Completed: Other (comment) Pt being transferred to [facility]. Will follow up if still at [facility].   Marguarite Arbour A Blaize Epple 03/27/2017, 1:26 PM Wray Kearns, [place], DPT [phone]

## 2017-03-27 NOTE — Progress Notes (Signed)
Pharmacy Antibiotic Note  Alexandria [name] is a 71 y.o. female admitted on 03/17/2017 with a wound infection at her previous craniotomy site.  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 Her renal function remains stable.  Per ID she will receive meropenem (ertapenem at discharge) for a total of 6 weeks.  Plan: Continue meropenem 2g IV q8h Monitor clinical picture, renal function F/U ID recs, C&S, abx deescalation / LOT OPAT orders for ertapenem have been pended  Height: 5' 1.5" (156.2 cm) Weight: 158 lb 11.7 oz (72 kg) IBW/kg (Calculated) : 48.95  Temp (24hrs), Avg:98 F (36.7 C), Min:97.5 F (36.4 C), Max:98.7 F (37.1 C)   Recent Labs Lab 03/20/17 1106 03/21/17 0059 03/21/17 0348 03/21/17 0354 03/22/17 0449  03/23/17 0339 03/24/17 0320 03/24/17 2107 03/25/17 0501 03/26/17 0440 03/27/17 0436  WBC 9.7  --   --  11.6* 13.2*  --  12.4* 11.2*  --   --   --   --   CREATININE 0.82  --   --  0.90 0.74  < > 0.75 0.79 0.66 0.57 0.55 0.59  LATICACIDVEN  --  1.5 1.8  --  1.2  --   --   --   --   --   --   --   < > = values in this interval not displayed.  Estimated Creatinine Clearance: 59.3 mL/min (by C-G formula based on SCr of 0.59 mg/dL).    Allergies  Allergen Reactions  . Wasp Venom Anaphylaxis  . Ceftriaxone Rash    Pt developed truncal rash on Ceftriaxone, flagyl and also possibly still on keppra. It persisted for weeks with scarring. She did complete 2-3 days of abx while rash still going on. Dermatologist diagnosed as drug rash  . Keppra [Levetiracetam] Rash    Possible drug rash see other notes  . Flagyl [Metronidazole] Rash    Possible drug rash see other notes  . Sulfa Antibiotics Hives   Meropenem 8/15 >> Vancomycin 8/15 >> 8/17  8/14 blood x 2 cx: NEG 8/15 abscess cx: Viridans Strep 8/15 bone flap: viridans strep 8/18: Trach Asp Cx: few candida albicans  Norva Riffle 03/27/2017 8:43 AM

## 2017-03-27 NOTE — Progress Notes (Signed)
PHARMACY - ADULT TOTAL PARENTERAL NUTRITION CONSULT NOTE   Pharmacy Consult for TPN Indication: Intolerance of enteral feeding  Patient Measurements: Height: 5' 1.5" (156.2 cm) Weight: 158 lb 11.7 oz (72 kg) IBW/kg (Calculated) : 48.95 TPN AdjBW (KG): 54.7 Body mass index is 29.51 kg/m.   Assessment:  71yo female admitted 8/14 s/p outpt clinic wound check with swelling at craniotomy site; pt s/p craniotomy 10/27/16 for brain abscess, MRI in July with no recurrent abscess.   She returned to OR on 8/15 for cranial wound exploration and removal of bone flap, and has since been NPO with N/V/diarrhea.  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   Clear liquids were ordered 8/15 and then advanced to Regular diet on 8/16, which pt did not tolerate due to abdominal pain and repeated nausea/vomiting and was subsequently made NPO on 8/17.  Pt has had little to no intake x 7 days and started TPN on 8/21.  GI:  GOO. Upper endoscopy 8/22 with feeding tube placement, Cortrak came out 8/23.  Increased N/V last PM and NG replaced- 1048mL out immediately. Remains NPO currently.  on PPI-IV Endo:  No hx DM, on sensitive SSI, CBGs 133-165 Insulin requirements in the past 24 hours: 8 units Lytes:  K 3.9 (K 33mEq via KPhos yest), CoCa 10.4, Phos 3.8 (s/p total 33mmol over last 2 days) Renal:  Cr < 1, stable. UOP x2 documented.  NS at 10 ml/hr Pulm: 4L O2, (extubated 8/20).  Albuterol nebs, mucomyst nebs x 72hr (8/20 >> ) Cards:   BP high, tachy. 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  WBC 11.2, afebrile  Best Practices: mouth care, heparin SQ TPN Access:  CVL- double lumen (8/16) TPN start date: 8/21  Nutritional Goals (per RD recommendation on  8/18): RD note:  Eating well pta but 9% wt loss in 4 mo KCal:  1293 kcal, will go with goal 1250-1350 kcal  Protein:  100-110g  Current Nutrition:  NPO, continue on TPN  Plan:  Continue Clinimix-E at 35mL/hr Add MVI and Trace Elements to tpn No Lipid emulsion x 7 days in ICU pt per ASPEN guidelines (day #4/7, first day of lipids 8/28) Continue Sensitive SSI q4 TPN labs qMon/Thurs Pt transferring to [facility] today, and should be leaving shortly   Lewie Chamber., PharmD Clinical Pharmacist [facility] [phone] until 3p, then call [phone]

## 2017-03-27 NOTE — Progress Notes (Signed)
At shift change pt. complaining of left, mid-quadrant epigastric pain and vomiting small to moderate amounts of brown gastric content. Pt.'s family at bedside requesting an NG tube be placed immediately. First contacted Dr. Grandville Silos, with neurosurgery, at [place] and told by him to contact general surgery. Contacted general surgery at 1950 and was redirected back to the attending, which is neurosurgery. Pt.'s family growing frustrated with no answers and son contacted gastroenterology and was on the phone with Dr. Penelope Coop from [phone]. Verbal orders were finally given via Dr. Penelope Coop to place NG tube with low-intermittent suction . NG tube inserted at 2030 and within 2-5 min pt. put out one liter of brown gastric content. Pt. feeling much better and both she and the family are much more at ease. Gastroenterology notified. Will continue to monitor.

## 2017-03-27 NOTE — Progress Notes (Signed)
Report called to receiving RN at [facility]. [facility] called for Transport. Pt and daughter at bedside updated. Room number [phone]. Admitting doctor Katherina Mires.

## 2017-04-10 ENCOUNTER — Other Ambulatory Visit: Payer: Self-pay | Admitting: Physical Medicine & Rehabilitation

## 2017-04-13 ENCOUNTER — Other Ambulatory Visit: Payer: Self-pay | Admitting: Pharmacist

## 2017-04-15 ENCOUNTER — Other Ambulatory Visit: Payer: Self-pay | Admitting: Pharmacist

## 2017-04-15 LAB — FUNGUS CULTURE WITH STAIN

## 2017-04-15 LAB — FUNGAL ORGANISM REFLEX

## 2017-04-15 LAB — FUNGUS CULTURE RESULT

## 2017-04-16 LAB — FUNGUS CULTURE WITH STAIN

## 2017-04-16 LAB — FUNGAL ORGANISM REFLEX

## 2017-04-16 LAB — FUNGUS CULTURE RESULT

## 2017-04-23 ENCOUNTER — Ambulatory Visit: Payer: Medicare Other | Admitting: Internal Medicine

## 2017-04-23 NOTE — Op Note (Signed)
03/17/2017 - 03/25/2017  8:48 AM  PATIENT:  Alexandria [name]  71 y.o. female  PRE-OPERATIVE DIAGNOSIS:  Subgaleal abscess and osteomyelitis  POST-OPERATIVE DIAGNOSIS:  Same  PROCEDURE:  Wound exploration, irrigation, and debridement with left frontal craniectomy  SURGEON:  Aldean Ast, MD  ASSISTANTS: Ferne Reus, PA-C  ANESTHESIA:   General  DRAINS: JP   SPECIMEN:  None  INDICATION FOR PROCEDURE: 71 year old woman with subgaleal abscess and osteomyelitis after craniotomy for brain abscess.  Ir ecommended the above operation. Patient understood the risks, benefits, and alternatives and potential outcomes and wished to proceed.  PROCEDURE DETAILS: After smooth induction of general endotracheal anesthesia the left frontal area was clipped of hair and wiped with alcohol.  The patient was positioned on a donut with the head turned to the right.  The patient was prepped and drapedin the usual sterile fashion.  The existing incision was re-opened. Purulent fluid was encountered.  This was collected as a specimen.  The wound had to be opened further anteriorly and posteriorly to expose the bone flap.  I debrided the necrotic galea.  The fixation screws for the bone flap were removed.  The bone flap was elevated and removed from the field and sent as a specimen.  The wound was irrigated vigorously.  Hemostasis was obtained.  Vancomycin powder was placed in the wound.  A JP drain was placed tunneled away from the wound.  The wound was closed with Prolene sutures in a vertical mattress stitch.  The skin was closed with staples.  A sterile dressing was applied.  The patient awoke from anesthesia without difficulty.  PATIENT DISPOSITION:  ICU - extubated and stable.   Delay start of Pharmacological VTE agent (>24hrs) due to surgical blood loss or risk of bleeding:  yes

## 2017-04-30 ENCOUNTER — Ambulatory Visit: Payer: Medicare Other | Admitting: Internal Medicine

## 2017-05-07 ENCOUNTER — Other Ambulatory Visit: Payer: Self-pay | Admitting: Neurosurgery

## 2017-05-07 DIAGNOSIS — G060 Intracranial abscess and granuloma: Secondary | ICD-10-CM

## 2017-11-21 ENCOUNTER — Emergency Department (HOSPITAL_COMMUNITY): Payer: Medicare Other

## 2017-11-21 ENCOUNTER — Emergency Department (HOSPITAL_COMMUNITY)
Admission: EM | Admit: 2017-11-21 | Discharge: 2017-11-21 | Disposition: A | Discharge status: Home / Self Care | Payer: Medicare Other | Attending: Physician Assistant | Admitting: Physician Assistant

## 2017-11-21 ENCOUNTER — Encounter (HOSPITAL_COMMUNITY): Payer: Self-pay

## 2017-11-21 DIAGNOSIS — R569 Unspecified convulsions: Secondary | ICD-10-CM | POA: Diagnosis not present

## 2017-11-21 DIAGNOSIS — Z9011 Acquired absence of right breast and nipple: Secondary | ICD-10-CM | POA: Insufficient documentation

## 2017-11-21 DIAGNOSIS — Z79899 Other long term (current) drug therapy: Secondary | ICD-10-CM | POA: Diagnosis not present

## 2017-11-21 DIAGNOSIS — Z853 Personal history of malignant neoplasm of breast: Secondary | ICD-10-CM | POA: Diagnosis not present

## 2017-11-21 DIAGNOSIS — I10 Essential (primary) hypertension: Secondary | ICD-10-CM | POA: Insufficient documentation

## 2017-11-21 LAB — COMPREHENSIVE METABOLIC PANEL
ALT: 12 U/L — ABNORMAL LOW (ref 14–54)
ANION GAP: 10 (ref 5–15)
AST: 21 U/L (ref 15–41)
Albumin: 2.7 g/dL — ABNORMAL LOW (ref 3.5–5.0)
Alkaline Phosphatase: 107 U/L (ref 38–126)
BILIRUBIN TOTAL: 0.4 mg/dL (ref 0.3–1.2)
BUN: 9 mg/dL (ref 6–20)
CO2: 23 mmol/L (ref 22–32)
Calcium: 8.7 mg/dL — ABNORMAL LOW (ref 8.9–10.3)
Chloride: 109 mmol/L (ref 101–111)
Creatinine, Ser: 0.86 mg/dL (ref 0.44–1.00)
GFR calc non Af Amer: >60 mL/min (ref >60)
Glucose, Bld: 108 mg/dL — ABNORMAL HIGH (ref 65–99)
POTASSIUM: 3.5 mmol/L (ref 3.5–5.1)
Sodium: 142 mmol/L (ref 135–145)
TOTAL PROTEIN: 6.1 g/dL — AB (ref 6.5–8.1)

## 2017-11-21 LAB — URINALYSIS, ROUTINE W REFLEX MICROSCOPIC
BILIRUBIN URINE: NEGATIVE
Bacteria, UA: NONE SEEN
Glucose, UA: NEGATIVE mg/dL
Hgb urine dipstick: NEGATIVE
KETONES UR: NEGATIVE mg/dL
NITRITE: NEGATIVE
PH: 6 (ref 5.0–8.0)
Protein, ur: NEGATIVE mg/dL
Specific Gravity, Urine: 1.011 (ref 1.005–1.030)

## 2017-11-21 LAB — CBG MONITORING, ED: GLUCOSE-CAPILLARY: 88 mg/dL (ref 65–99)

## 2017-11-21 LAB — CBC WITH DIFFERENTIAL/PLATELET
Basophils Absolute: 0.1 10*3/uL (ref 0.0–0.1)
Basophils Relative: 1 %
EOS PCT: 6 %
Eosinophils Absolute: 0.4 10*3/uL (ref 0.0–0.7)
HEMATOCRIT: 26.5 % — AB (ref 36.0–46.0)
Hemoglobin: 8.3 g/dL — ABNORMAL LOW (ref 12.0–15.0)
LYMPHS ABS: 1.1 10*3/uL (ref 0.7–4.0)
Lymphocytes Relative: 19 %
MCH: 27.4 pg (ref 26.0–34.0)
MCHC: 31.3 g/dL (ref 30.0–36.0)
MCV: 87.5 fL (ref 78.0–100.0)
MONOS PCT: 6 %
Monocytes Absolute: 0.4 10*3/uL (ref 0.1–1.0)
NEUTROS ABS: 3.9 10*3/uL (ref 1.7–7.7)
Neutrophils Relative %: 68 %
PLATELETS: 261 10*3/uL (ref 150–400)
RBC: 3.03 MIL/uL — ABNORMAL LOW (ref 3.87–5.11)
RDW: 21.6 % — AB (ref 11.5–15.5)
WBC: 5.9 10*3/uL (ref 4.0–10.5)

## 2017-11-21 MED ORDER — LEVETIRACETAM 500 MG PO TABS: 500.0000 mg | ORAL_TABLET | Freq: Two times a day (BID) | ORAL | 0 refills | Status: AC

## 2017-11-21 MED ORDER — LEVETIRACETAM 500 MG PO TABS
1000.0000 mg | ORAL_TABLET | Freq: Once | ORAL | Status: AC
  Administered 2017-11-21: 1000 mg via ORAL
  Filled 2017-11-21: qty 2

## 2017-11-21 NOTE — ED Notes (Signed)
Pt back from CT

## 2017-11-21 NOTE — ED Provider Notes (Signed)
[place] EMERGENCY DEPARTMENT Provider Note   CSN: [id number] Arrival date & time: 11/21/17  1606     History   Chief Complaint Chief Complaint  Patient presents with  . Seizures    HPI Alexandria [name] is a 72 y.o. female.  HPI   Patient is a 72 year old female with very complicated past medical history.  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  During 1 of these patient was placed on meropenem that she had a seizure and the antibiotic was stopped.  Patient then 6 months later developed a Klebsiella abscess to the liver.  JP drain was placed and patient's mainly recovered from this.  Was discharged from prolonged hospitalization at [facility] 1 week ago.  Patient was discharged on meropenem.  Patient had seizure today.  We are approximately 4 out of 6 weeks of the meropenem.  Patient was at home daughter-in-law was watching her when she had shaking-like movements of her arms legs, bit her tongue, spit up blood, was confused.  Unable to answer questions.  And then had a postictal period described by EMS.  Patient's still mildly confused.  Past Medical History:  Diagnosis Date  . Cancer [facility]) 1996   breast cx  . Hypertension   . PONV (postoperative nausea and vomiting)     Patient Active Problem List   Diagnosis Date Noted  . 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 Hypoxemia   . Aspiration into airway   . Acute respiratory failure ([place])   . Aspiration pneumonia ([place]) 03/20/2017  . Abscess   . Infection of craniotomy plate ([place])   . Allergy to cephalosporin   . History of DVT (deep vein thrombosis)   . Surgical site infection 03/17/2017  . GERD (gastroesophageal reflux disease) 12/24/2016  . Streptococcal infection   . Fusobacterium infection   . Hyperglycemia 10/27/2016  . Hx of breast cancer 10/23/2016  . Essential hypertension 10/23/2016  . Hypokalemia 10/23/2016  . Brain abscess 10/22/2016    Past  Surgical History:  Procedure Laterality Date  . ABDOMINAL HYSTERECTOMY    . APPLICATION OF CRANIAL NAVIGATION N/A 10/27/2016   Procedure: APPLICATION OF CRANIAL NAVIGATION;  Surgeon: Erline Levine, MD;  Location: [place];  Service: Neurosurgery;  Laterality: N/A;  . CRANIOTOMY Left 10/27/2016   Procedure: CRANIOTOMY TUMOR EXCISION;  Surgeon: Erline Levine, MD;  Location: [place];  Service: Neurosurgery;  Laterality: Left;  . CRANIOTOMY Left 03/18/2017   Procedure: Left Frontal Cranial Wound Exploration with removal of bone flap;  Surgeon: Ditty, Kevan Ny, MD;  Location: [place];  Service: Neurosurgery;  Laterality: Left;  . ESOPHAGOGASTRODUODENOSCOPY (EGD) WITH PROPOFOL N/A 03/25/2017   Procedure: ESOPHAGOGASTRODUODENOSCOPY (EGD) WITH PROPOFOL;  Surgeon: Clarene Essex, MD;  Location: [place];  Service: Endoscopy;  Laterality: N/A;  . IR FLUORO GUIDE CV LINE RIGHT  03/19/2017  . IR GENERIC HISTORICAL  11/02/2016   IR US GUIDE VASC ACCESS RIGHT 11/02/2016 Arne Cleveland, MD [facility]-INTERV RAD  . IR GENERIC HISTORICAL  11/02/2016   IR FLUORO GUIDE CV LINE RIGHT 11/02/2016 Arne Cleveland, MD [facility]INTER[facility]  . IR REMOVAL TUN CV CATH W/O FL  12/10/2016  . IR US GUIDE VASC ACCESS RIGHT  03/19/2017  . MASTECTOMY     right breast     OB History   None      Home Medications    Prior to Admission medications   Medication Sig Start Date End Date Taking? Authorizing Provider  benazepril (LOTENSIN) 20 MG tablet TAKE 1 TABLET ONCE DAILY. 04/10/17   Meredith Staggers, MD  metoprolol succinate (TOPROL XL) 50 MG 24 hr tablet Take 1 tablet (50 mg total) by mouth daily. Take with or immediately following a meal. Patient taking differently: Take 50 mg by mouth at bedtime. Take with or immediately following a meal. 11/17/16   Meredith Staggers, MD  Nutritional Supplements (FEEDING SUPPLEMENT, JEVITY 1.2 CAL,) LIQD Place 1,000 mLs into feeding tube daily. 03/27/17   Costella, Vista Mink, PA-C  pantoprazole (PROTONIX) 40 MG  tablet Take 1 tablet (40 mg total) by mouth daily. 12/24/16   Meredith Staggers, MD  raloxifene (EVISTA) 60 MG tablet Take 60 mg by mouth daily.    [provider]  saccharomyces boulardii (FLORASTOR) 250 MG capsule Take 250 mg by mouth 2 (two) times daily.    [provider]    Family History Family History  Problem Relation Age of Onset  . Breast cancer Mother   . Colon cancer Father     Social History Social History   Tobacco Use  . Smoking status: Never Smoker  . Smokeless tobacco: Never Used  Substance Use Topics  . Alcohol use: Yes    Comment: occasional  . Drug use: No     Allergies   Wasp venom; Ceftriaxone; Keppra [levetiracetam]; Flagyl [metronidazole]; and Sulfa antibiotics   Review of Systems Review of Systems  Constitutional: Negative for activity change.  Respiratory: Negative for shortness of breath.   Cardiovascular: Negative for chest pain.  Gastrointestinal: Negative for abdominal pain.  Neurological: Positive for seizures.     Physical Exam Updated Vital Signs BP 135/81   Pulse 100   Temp 99.4 F (37.4 C) (Oral)   Resp (!) 22   Ht 5\' 2"  (1.575 m)   Wt 59 kg (130 lb)   SpO2 90%   BMI 23.78 kg/m   Physical Exam  Constitutional: She appears well-developed and well-nourished.  HENT:  Head: Normocephalic and atraumatic.  Eyes: Right eye exhibits no discharge. Left eye exhibits no discharge.  Cardiovascular: Normal rate and regular rhythm.  Pulmonary/Chest: Effort normal.  Abdominal:  JP drain on Abdomen.  Neurological:  Mild confusion.  Skin: Skin is warm and dry. She is not diaphoretic.  Psychiatric: Her behavior is normal.  Nursing note and vitals reviewed.    ED Treatments / Results  Labs (all labs ordered are listed, but only abnormal results are displayed) Labs Reviewed  CBC WITH DIFFERENTIAL/PLATELET  COMPREHENSIVE METABOLIC PANEL  URINALYSIS, ROUTINE W REFLEX MICROSCOPIC  CBG MONITORING, ED     EKG EKG Interpretation  Date/Time:  Saturday November 21 2017 16:16:57 EDT Ventricular Rate:  98 PR Interval:    QRS Duration: 97 QT Interval:  424 QTC Calculation: 542 R Axis:   76 Text Interpretation:  Sinus rhythm Twave inversions new since last ekg/  Confirmed by Zenovia Jarred [phone]) on 11/21/2017 4:21:16 PM   Radiology Dg Chest Portable 1 View  Result Date: 11/21/2017 CLINICAL DATA:  Pt from home had one witnessed seizure lasting about one min pt was post ictal for about 20 mins pt AANDOx4. Pt has history of stroke 6 weeks ago and was at [facility] until last Monday. pt has history of seizures on antibiotics EXAM: PORTABLE CHEST 1 VIEW COMPARISON:  03/27/2017 FINDINGS: Patient has a RIGHT-sided PICC line, tip to the superior vena cava. Removal of nasogastric tube since previous exam. Heart size is normal. LEFT lung is clear. There is  persistent pleural change and opacity at the RIGHT lung base. Although improved, the persistence suggests scarring or recurrence of RIGHT pleural effusion and atelectasis. Large hiatal hernia. Interval placement of drain within the RIGHT UPPER QUADRANT, presumably within a RIGHT liver lesion. No acute fracture or pneumothorax. IMPRESSION: 1. Persistent changes at the RIGHT lung base, likely chronic. 2.  No evidence for acute cardiopulmonary abnormality. 3. RIGHT UPPER QUADRANT surgical drain. Electronically Signed   By: Nolon Nations M.D.   On: 11/21/2017 16:50    Procedures Procedures (including critical care time)  Medications Ordered in ED Medications - No data to display   Initial Impression / Assessment and Plan / ED Course  I have reviewed the triage vital signs and the nursing notes.  Pertinent labs & imaging results that were available during my care of the patient were reviewed by me and considered in my medical decision making (see chart for details).     Patient is a 72 year old female with very complicated past medical history.   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  During 1 of these patient was placed on meropenem that she had a seizure and the antibiotic was stopped.  Patient then 6 months later developed a Klebsiella abscess to the liver.  JP drain was placed and patient's mainly recovered from this.  Was discharged from prolonged hospitalization at [facility] 1 week ago.  Patient was discharged on meropenem.  Patient had seizure today.  We are approximately 4 out of 6 weeks of the meropenem.  Patient was at home daughter-in-law was watching her when she had shaking-like movements of her arms legs, bit her tongue, spit up blood, was confused.  Unable to answer questions.  And then had a postictal period described by EMS.  Patient's still mildly confused.  8:40 PM CT shows evidence of  Subacute stroke  However think this is the stroke that happened at [facility] within the last 6 weeks.  Otherwise labs of been reassuring.  Mildly low hemoglobin which is likely from her recent chronic illness and hospital stay at [facility].  Patient family reports Dr. Suzan Slick from [facility] infectious disease is in charge of the meropenem and treatment of this infectious disease.  Called coverage for [facility] infectious disease.  Discussed with Dr. Lerry Liner.  Acknowledging its very limiting to have a phone consult, and the inability tocompletely review chart, decision made to continue meropenem, add Keppra.  Patient has had seizures with meropenem previously and is only on it for an additional 2 weeks.  We will have them touch base with Dr. Suzan Slick on Monday morning.  Plan given to family members.  They will follow-up.  Return precautions expressed. Final Clinical Impressions(s) / ED Diagnoses   Final diagnoses:  None    ED Discharge Orders    None       Macarthur Critchley, MD 11/21/17 [phone]

## 2017-11-21 NOTE — ED Notes (Signed)
Patient transported to CT 

## 2017-11-21 NOTE — Discharge Instructions (Signed)
As we discussed, we are unsure what caused patient seizure today.  It is very possible that it was secondary to the meropenem use.  We discussed with the [facility] infectious disease.  I think a reasonable next course of action would be to start [place], and then follow-up with infectious disease as soon as possible.  If she were to have a seizure again, we want you to return emergently immediately to the emergency department.

## 2017-11-21 NOTE — ED Triage Notes (Signed)
Pt from home had one witnessed seizure lasting about one min pt was post ictal for about 20 mins pt A&Ox4. Pt has history of stroke 6 weeks ago and was at [facility] until last Monday.  pt has history of seizures on antibiotics

## 2018-12-16 IMAGING — CT CT CHEST W/ CM
2 of 5 series · 13 of 36 positions shown, 16 images · IV contrast (APPLIED)
Comparison: None.

CLINICAL DATA: Sepsis. Pt denies chest pain or sob. Pt denies abd
pain or complaints.

EXAM:
CT CHEST, ABDOMEN, AND PELVIS WITH CONTRAST
TECHNIQUE: Multidetector CT imaging of the chest, abdomen and pelvis was
performed following the standard protocol during bolus
administration of intravenous contrast.
CONTRAST:  100mL ZVJ76S-L11 IOPAMIDOL (ZVJ76S-L11) INJECTION 61%

[Series 3: cap 5.0 i31f 2 · axial · 0.79mm/px · z∈[+1034,+1549]mm · 10 of 127 slices shown, 13 images]
[im 12/127  mediastinal]
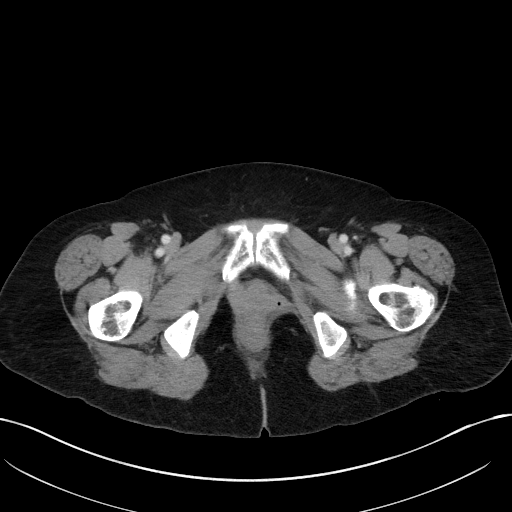
[im 12/127  lung]
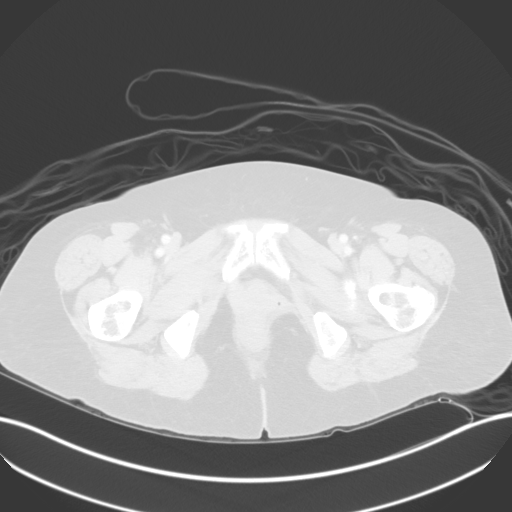
[im 23/127  lung]
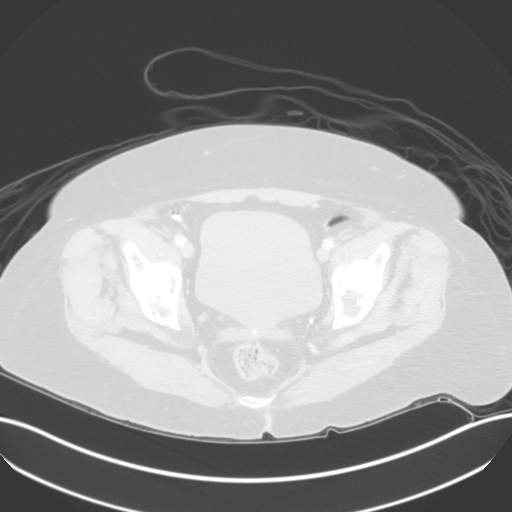
[im 35/127  lung]
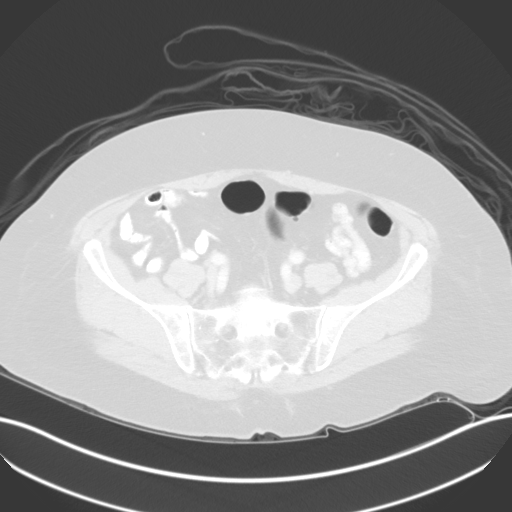
[im 46/127  lung]
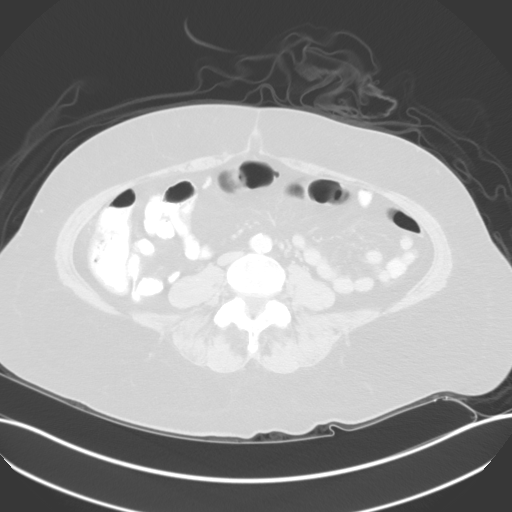
[im 58/127  mediastinal]
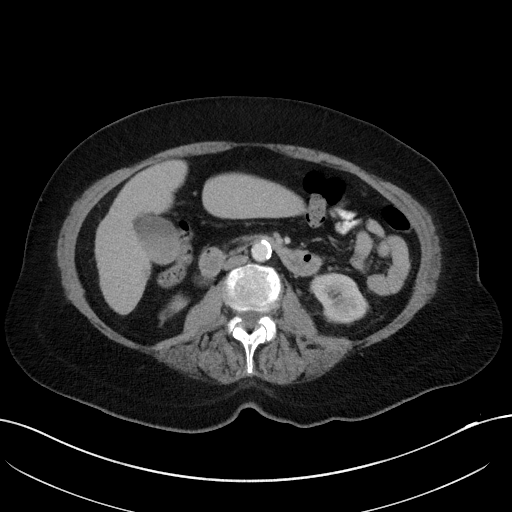
[im 58/127  lung]
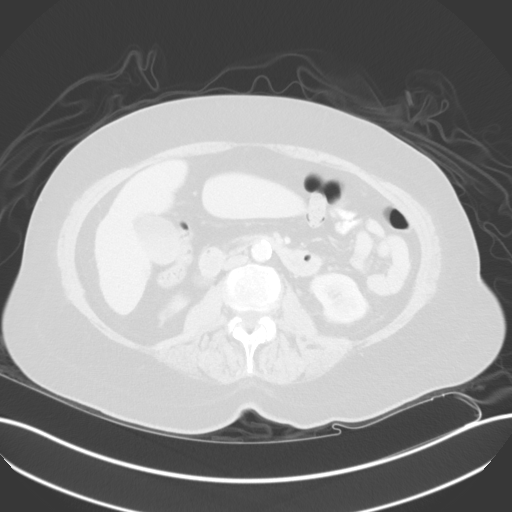
[im 69/127  lung]
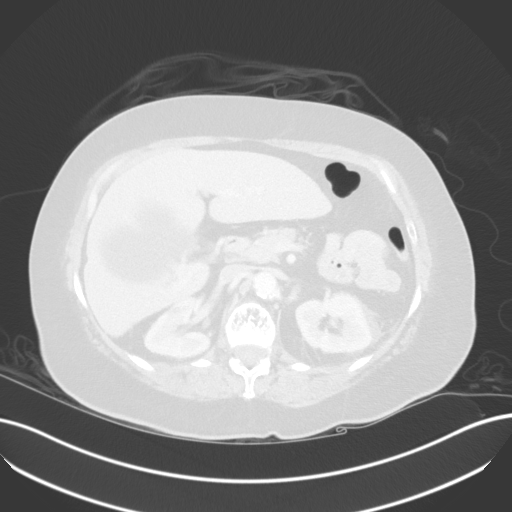
[im 81/127  lung]
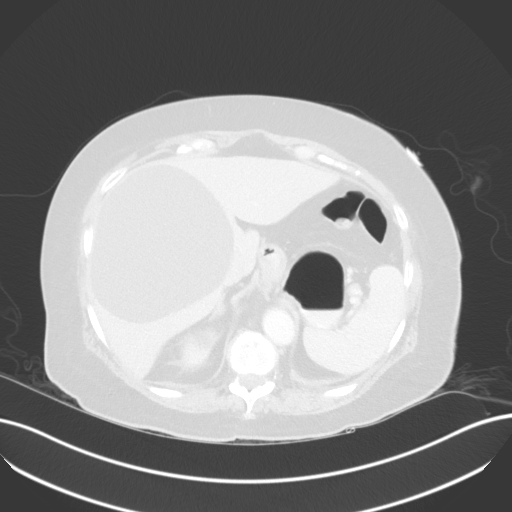
[im 92/127  lung]
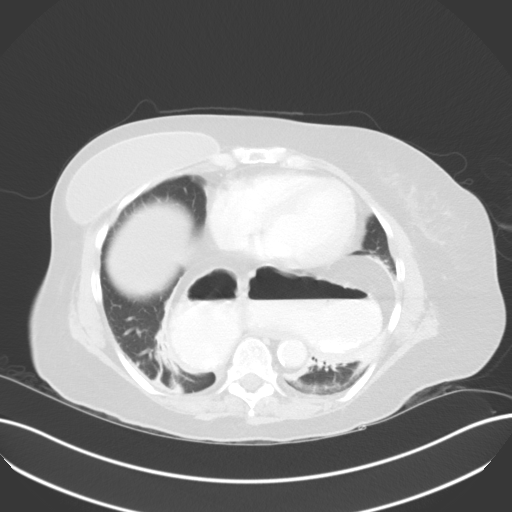
[im 104/127  mediastinal]
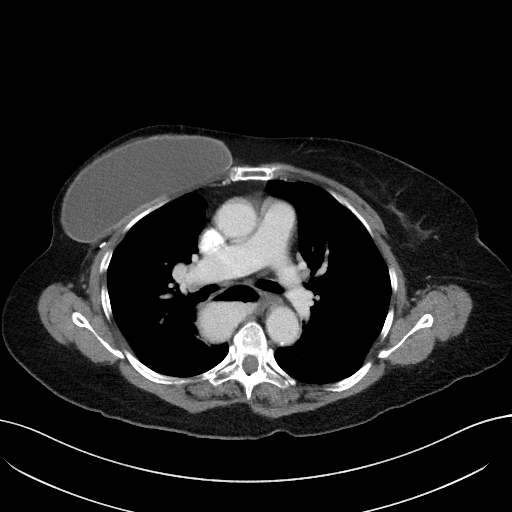
[im 104/127  lung]
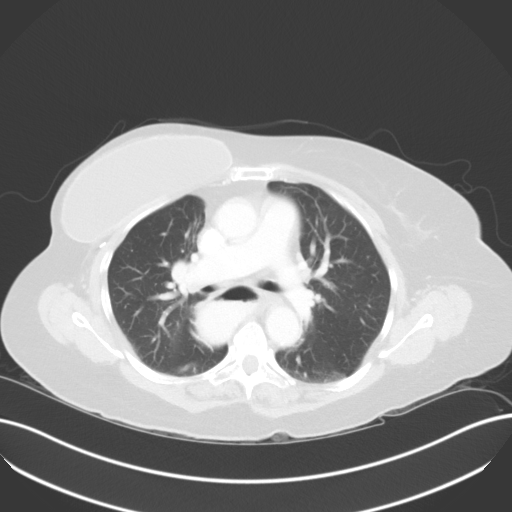
[im 115/127  lung]
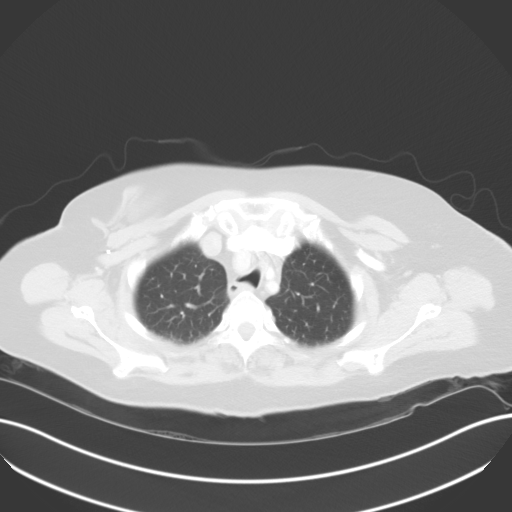

[Series 6: coronal · coronal · 0.75mm/px · 3 of 125 slices shown]
[im 25/125  lung]
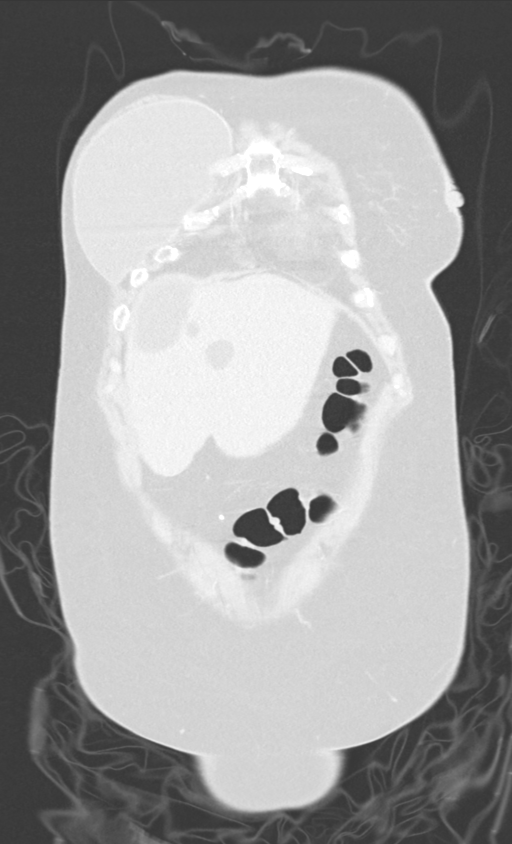
[im 50/125  lung]
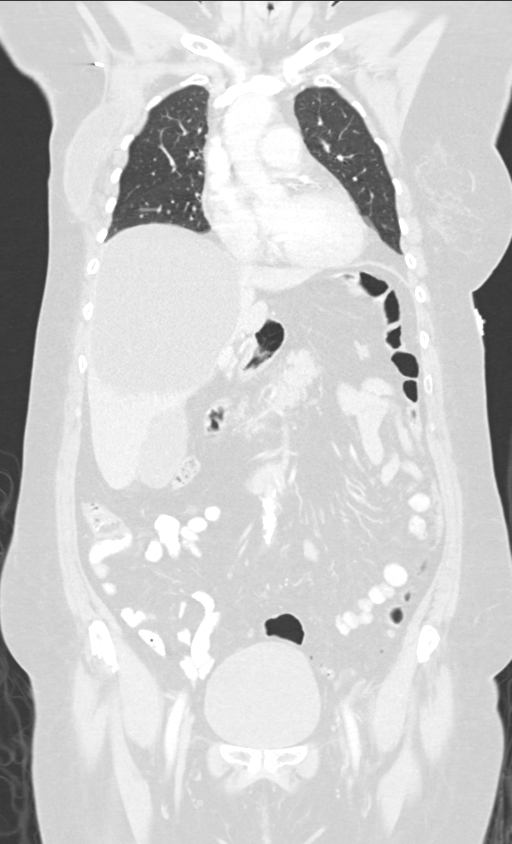
[im 75/125  lung]
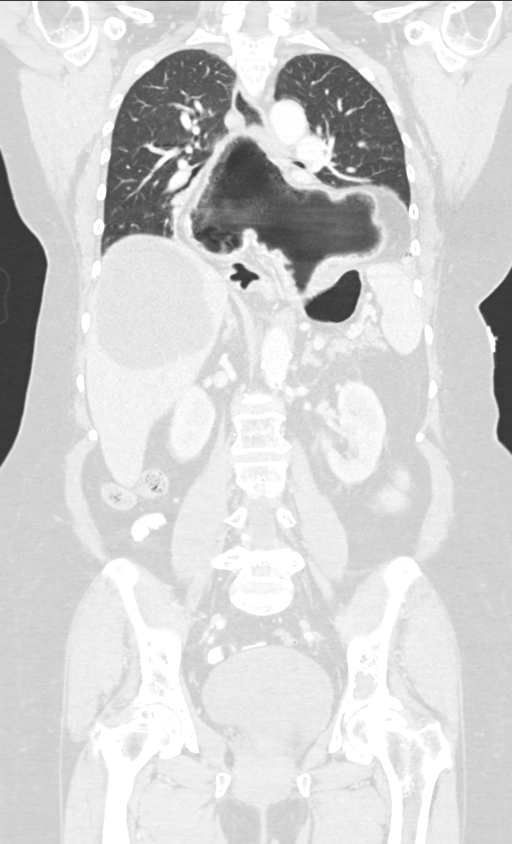

[13 of 36 positions shown; findings below may reference images not displayed]

FINDINGS: CT CHEST FINDINGS

Cardiovascular: Heart is normal size. Aortic calcifications. No
aneurysm. Scattered coronary artery calcifications.

Mediastinum/Nodes: No mediastinal, hilar, or axillary adenopathy.
Large hiatal hernia containing much of the stomach. Trachea is
unremarkable. Thyroid and thoracic inlet unremarkable.

Lungs/Pleura: Linear platelike scarring or atelectasis in the lower
lobes adjacent to the large hiatal hernia. No effusions. No
suspicious nodules.

Musculoskeletal: No acute bony abnormality.

CT ABDOMEN PELVIS FINDINGS

Hepatobiliary: Large central cyst in the liver measuring 12.5 x 11
cm. Other smaller scattered cysts. Gallbladder grossly unremarkable.

Pancreas: No focal abnormality or ductal dilatation.

Spleen: No focal abnormality.  Normal size.

Adrenals/Urinary Tract: Small renal cysts bilaterally. No
hydronephrosis or suspicious mass. Adrenal glands and urinary
bladder unremarkable.

Stomach/Bowel: Large and small bowel decompressed, unremarkable.

Vascular/Lymphatic: No evidence of aneurysm or adenopathy. Diffuse
aortic and iliac calcifications.

Reproductive: Prior hysterectomy.  No adnexal masses.

Other: No free fluid or free air.

Musculoskeletal: No acute bony abnormality. Hemangioma in the L1
vertebral body.
IMPRESSION: No acute findings in the chest, abdomen or pelvis.

Large hiatal hernia. Compressive atelectasis or scarring in the
lower lobes bilaterally.

Aortic atherosclerosis, scattered coronary artery disease.

Hepatic and renal cysts.

## 2019-08-26 ENCOUNTER — Ambulatory Visit: Payer: Medicare Other | Attending: Internal Medicine

## 2019-08-26 DIAGNOSIS — Z23 Encounter for immunization: Secondary | ICD-10-CM

## 2019-08-26 NOTE — Progress Notes (Signed)
   Covid-19 Vaccination Clinic  Name:  Alexandria [name]    MRN: [id number] DOB: Mar 24, 1946  08/26/2019  [name] was observed post Covid-19 immunization for 15 minutes without incidence. She was provided with Vaccine Information Sheet and instruction to access the V-Safe system.   [name] was instructed to call 911 with any severe reactions post vaccine: Marland Kitchen Difficulty breathing  . Swelling of your face and throat  . A fast heartbeat  . A bad rash all over your body  . Dizziness and weakness    Immunizations Administered    Name Date Dose VIS Date Route   Pfizer COVID-19 Vaccine 08/26/2019 11:34 AM 0.3 mL 07/15/2019 Intramuscular   Manufacturer: [place]   Lot: [id number]   [place]: [id number]

## 2019-09-16 ENCOUNTER — Ambulatory Visit: Payer: Medicare Other | Attending: Internal Medicine

## 2019-09-16 DIAGNOSIS — Z23 Encounter for immunization: Secondary | ICD-10-CM | POA: Insufficient documentation

## 2019-09-16 NOTE — Progress Notes (Signed)
   Covid-19 Vaccination Clinic  Name:  Alexandria [name]    MRN: [id number] DOB: [birth year]  09/16/2019  [name] was observed post Covid-19 immunization for 15 minutes without incidence. She was provided with Vaccine Information Sheet and instruction to access the V-Safe system.   [name] was instructed to call 911 with any severe reactions post vaccine: Marland Kitchen Difficulty breathing  . Swelling of your face and throat  . A fast heartbeat  . A bad rash all over your body  . Dizziness and weakness    Immunizations Administered    Name Date Dose VIS Date Route   Pfizer COVID-19 Vaccine 09/16/2019  4:19 PM 0.3 mL 07/15/2019 Intramuscular   Manufacturer: [place]   Lot: [id number]   [place]: [id number]

## 2019-09-24 ENCOUNTER — Ambulatory Visit: Payer: Medicare Other

## 2020-06-15 ENCOUNTER — Other Ambulatory Visit: Payer: Self-pay | Admitting: Internal Medicine

## 2020-06-15 DIAGNOSIS — R5381 Other malaise: Secondary | ICD-10-CM

## 2020-12-26 ENCOUNTER — Other Ambulatory Visit: Payer: Self-pay

## 2023-06-03 ENCOUNTER — Other Ambulatory Visit: Payer: Self-pay

## 2023-06-03 ENCOUNTER — Emergency Department (HOSPITAL_BASED_OUTPATIENT_CLINIC_OR_DEPARTMENT_OTHER): Payer: Medicare Other

## 2023-06-03 ENCOUNTER — Encounter (HOSPITAL_BASED_OUTPATIENT_CLINIC_OR_DEPARTMENT_OTHER): Payer: Self-pay | Admitting: Radiology

## 2023-06-03 ENCOUNTER — Emergency Department (HOSPITAL_BASED_OUTPATIENT_CLINIC_OR_DEPARTMENT_OTHER)
Admission: EM | Admit: 2023-06-03 | Discharge: 2023-06-03 | Disposition: A | Discharge status: Home / Self Care | Payer: Medicare Other | Attending: Emergency Medicine | Admitting: Emergency Medicine

## 2023-06-03 DIAGNOSIS — R109 Unspecified abdominal pain: Secondary | ICD-10-CM | POA: Insufficient documentation

## 2023-06-03 DIAGNOSIS — W19XXXA Unspecified fall, initial encounter: Secondary | ICD-10-CM | POA: Insufficient documentation

## 2023-06-03 DIAGNOSIS — I10 Essential (primary) hypertension: Secondary | ICD-10-CM | POA: Insufficient documentation

## 2023-06-03 DIAGNOSIS — Z7982 Long term (current) use of aspirin: Secondary | ICD-10-CM | POA: Insufficient documentation

## 2023-06-03 DIAGNOSIS — Z79899 Other long term (current) drug therapy: Secondary | ICD-10-CM | POA: Diagnosis not present

## 2023-06-03 MED ORDER — METHOCARBAMOL 500 MG PO TABS: 500.0000 mg | ORAL_TABLET | Freq: Two times a day (BID) | ORAL | 0 refills | Status: DC

## 2023-06-03 MED ORDER — METHOCARBAMOL 500 MG PO TABS: 500.0000 mg | ORAL_TABLET | Freq: Two times a day (BID) | ORAL | 0 refills | Status: AC

## 2023-06-03 MED ORDER — LIDOCAINE 5 % EX PTCH: 1.0000 | MEDICATED_PATCH | CUTANEOUS | 0 refills | Status: DC

## 2023-06-03 MED ORDER — HYDROCODONE-ACETAMINOPHEN 5-325 MG PO TABS: 1.0000 | ORAL_TABLET | Freq: Four times a day (QID) | ORAL | 0 refills | Status: AC | PRN

## 2023-06-03 MED ORDER — HYDROCODONE-ACETAMINOPHEN 5-325 MG PO TABS
1.0000 | ORAL_TABLET | Freq: Once | ORAL | Status: AC
  Administered 2023-06-03: 1 via ORAL
  Filled 2023-06-03: qty 1

## 2023-06-03 MED ORDER — METHOCARBAMOL 500 MG PO TABS
500.0000 mg | ORAL_TABLET | Freq: Once | ORAL | Status: AC
  Administered 2023-06-03: 500 mg via ORAL
  Filled 2023-06-03: qty 1

## 2023-06-03 MED ORDER — HYDROCODONE-ACETAMINOPHEN 5-325 MG PO TABS
1.0000 | ORAL_TABLET | Freq: Four times a day (QID) | ORAL | 0 refills | Status: DC | PRN
Start: 2023-06-03 — End: 2023-06-03

## 2023-06-03 MED ORDER — LIDOCAINE 5 % EX PTCH: 1.0000 | MEDICATED_PATCH | CUTANEOUS | 0 refills | Status: AC

## 2023-06-03 NOTE — ED Notes (Signed)
Pt labelled a fall risk due to chief complaint of a fall and her age. Yellow fall risk band placed on pt wrist, pt preferred to keep hard sole sneakers on her feet Fall risk sign placed outside of the door. Call bell and personal belongings placed within reach.  Pt educated on the importance calling for assistance before attempting to ambulate  Family at bedside

## 2023-06-03 NOTE — ED Triage Notes (Signed)
Pt presents to the er after a fall that took place yesterday. Pt reports falling on her back hitting her back on a table. Pt denies hitting her head and blacking out. Pt reports being on blood thinners. Pt also reports severe back to lower left back area

## 2023-06-03 NOTE — Discharge Instructions (Signed)
You were seen in the emergency department for your left-sided flank pain after your fall.  Your CAT scan did not show any broken bones or internal bleeding and you likely are just bruised up.  You should continue to use ice for the next 3 days and then can use heat thereafter.  You can take Tylenol and Motrin every 6 hours as needed for pain.  I given you Norco to use as needed for breakthrough pain.  Just do not exceed 4000 mg of Tylenol in 24 hours.  You can also take Robaxin as needed for muscle relaxers.  Both Norco and Robaxin can make you drowsy so I would recommend alternating them and not taking them together and do not take them before driving, working or operating heavy machinery.  You can follow-up with your primary doctor in the next few days to have your symptoms rechecked.  You should return to the emergency department for significantly worsening pain, severe shortness of breath or any other new or concerning symptoms.

## 2023-06-03 NOTE — ED Provider Notes (Signed)
Patient signed out to me at 1500 by Dr. Anitra Lauth pending CT read.  In short this is a 77 year old female presenting to the emergency department with left-sided flank/rib pain after a fall onto a table yesterday.  Patient was hemodynamically stable on arrival satting well on room air.  She was given Vicodin and Robaxin for pain control.  Patient's CT is pending read at this time.  Clinical Course as of 06/03/23 1703  Wed Jun 03, 2023  1631 No acute traumatic injury seen on CT. Likely contusion of ribs. Recommended symptomatic management and outpatient follow up. [VK]    Clinical Course User Index [VK] Rexford Maus, DO      Rexford Maus, [place] 06/03/23 [phone]

## 2023-06-03 NOTE — ED Provider Notes (Signed)
[place] EMERGENCY DEPARTMENT AT [facility] Provider Note   CSN: [id number] Arrival date & time: 06/03/23  1324     History  Chief Complaint  Patient presents with   Marletta Lor    Alexandria [name] is a 77 y.o. female.  Patient is a 77 year old female with a history of hypertension who is presenting today from orthopedics office for further evaluation.  Patient reports yesterday she was playing with her dog when she lost her balance and started falling down.  She went to grab a small in table and it fell causing her to twist around and fall backwards on top of it.  Since the fall she has had significant pain in the left back and rib area.  It is severely painful if she twists to the left.  She has been taking Tylenol with some pain control.  She denies any shortness of breath.  She denies any head injury or loss of consciousness.  The orthopedist did a x-ray of her back which was normal but they could not x-ray her ribs.  She is also having some pain in her flank area.  She denies any issues with urinating or having bowel movements.  She does not take any anticoagulation.  The history is provided by the patient.  Fall       Home Medications Prior to Admission medications   Medication Sig Start Date End Date Taking? Authorizing Provider  aspirin 81 MG chewable tablet Chew 81 mg by mouth daily.    [provider]  benazepril (LOTENSIN) 20 MG tablet TAKE 1 TABLET ONCE DAILY. Patient not taking: Reported on 11/21/2017 04/10/17   Faith Rogue T, MD  ertapenem 1 g in sodium chloride 0.9 % 100 mL Inject 1 g into the vein at bedtime.    [provider]  levETIRAcetam (KEPPRA) 500 MG tablet Take 1 tablet (500 mg total) by mouth 2 (two) times daily for 20 days. 11/21/17 12/11/17  Mackuen, Courteney Lyn, MD  metoprolol succinate (TOPROL XL) 50 MG 24 hr tablet Take 1 tablet (50 mg total) by mouth daily. Take with or immediately following a meal. 11/17/16   Ranelle Oyster, MD  Multiple Vitamin (MULTIVITAMIN WITH MINERALS) TABS tablet Take 1 tablet by mouth daily.    [provider]  Nutritional Supplements (FEEDING SUPPLEMENT, JEVITY 1.2 CAL,) LIQD Place 1,000 mLs into feeding tube daily. Patient not taking: Reported on 11/21/2017 03/27/17   Alyson Ingles, PA-C  pantoprazole (PROTONIX) 40 MG tablet Take 1 tablet (40 mg total) by mouth daily. Patient not taking: Reported on 11/21/2017 12/24/16   Ranelle Oyster, MD      Allergies    Wasp venom, Ceftriaxone, Keppra [levetiracetam], Aztreonam, Flagyl [metronidazole], and Sulfa antibiotics    Review of Systems   Review of Systems  Physical Exam Updated Vital Signs BP (!) 189/87 (BP Location: Right Arm)   Pulse 76   Temp 98.2 F (36.8 C) (Oral)   Resp 17   SpO2 97%  Physical Exam Vitals and nursing note reviewed.  Constitutional:      General: She is not in acute distress.    Appearance: She is well-developed.  HENT:     Head: Normocephalic and atraumatic.  Eyes:     Conjunctiva/sclera: Conjunctivae normal.     Pupils: Pupils are equal, round, and reactive to light.  Cardiovascular:     Rate and Rhythm: Normal rate and regular rhythm.     Heart sounds: No murmur heard. Pulmonary:  Effort: Pulmonary effort is normal. No respiratory distress.     Breath sounds: Normal breath sounds. No wheezing or rales.    Chest:     Chest wall: Tenderness present.    Abdominal:     General: There is no distension.     Palpations: Abdomen is soft.     Tenderness: There is abdominal tenderness in the left upper quadrant. There is left CVA tenderness. There is no guarding or rebound.  Musculoskeletal:        General: No tenderness. Normal range of motion.     Cervical back: Normal range of motion and neck supple.     Right lower leg: No edema.     Left lower leg: No edema.  Skin:    General: Skin is warm and dry.     Findings: No erythema or rash.  Neurological:     Mental  Status: She is alert and oriented to person, place, and time.  Psychiatric:        Behavior: Behavior normal.     ED Results / Procedures / Treatments   Labs (all labs ordered are listed, but only abnormal results are displayed) Labs Reviewed - No data to display  EKG None  Radiology No results found.  Procedures Procedures    Medications Ordered in ED Medications  HYDROcodone-acetaminophen (NORCO/VICODIN) 5-325 MG per tablet 1 tablet (has no administration in time range)  methocarbamol (ROBAXIN) tablet 500 mg (has no administration in time range)    ED Course/ Medical Decision Making/ A&P                                 Medical Decision Making Amount and/or Complexity of Data Reviewed Radiology: ordered.  Risk Prescription drug management.   Pt with multiple medical problems and comorbidities and presenting today with a complaint that caries a high risk for morbidity and mortality.  Here today after a fall yesterday with severe pain in the left rib and flank area.  Concern for rib fractures versus splenic or renal injury  Patient given pain control here.  Sats are 97% on room air and she is able to take deep breaths.  Also possibility this is muscular injury.  CT of the chest and abdomen/pelvis are pending.  Patient reports contrast allergy so this was done without contrast.        Final Clinical Impression(s) / ED Diagnoses Final diagnoses:  None    Rx / DC Orders ED Discharge Orders     None         Gwyneth Sprout, MD 06/03/23 1457

## 2024-06-07 ENCOUNTER — Ambulatory Visit (INDEPENDENT_AMBULATORY_CARE_PROVIDER_SITE_OTHER): Admitting: Audiology

## 2024-06-07 ENCOUNTER — Institutional Professional Consult (permissible substitution) (INDEPENDENT_AMBULATORY_CARE_PROVIDER_SITE_OTHER): Admitting: Otolaryngology

## 2024-06-23 ENCOUNTER — Encounter (INDEPENDENT_AMBULATORY_CARE_PROVIDER_SITE_OTHER): Payer: Self-pay | Admitting: Otolaryngology

## 2024-06-23 ENCOUNTER — Ambulatory Visit (INDEPENDENT_AMBULATORY_CARE_PROVIDER_SITE_OTHER): Admitting: Otolaryngology

## 2024-06-23 VITALS — HR 75 | Temp 98.0°F | Ht 61.5 in | Wt 168.0 lb

## 2024-06-23 DIAGNOSIS — H903 Sensorineural hearing loss, bilateral: Secondary | ICD-10-CM | POA: Diagnosis not present

## 2024-06-23 DIAGNOSIS — J300 Vasomotor rhinitis: Secondary | ICD-10-CM | POA: Diagnosis not present

## 2024-06-23 MED ORDER — IPRATROPIUM BROMIDE 0.06 % NA SOLN: 2.0000 | Freq: Two times a day (BID) | NASAL | 12 refills | Status: AC | PRN

## 2024-06-23 NOTE — Progress Notes (Signed)
 CC: Progressive hearing loss  Discussed the use of AI scribe software for clinical note transcription with the patient, who gave verbal consent to proceed.  History of Present Illness Alexandria [name] is a 78 year old female with a history of bilateral hearing loss who presents for evaluation of her hearing.  Her family believes her hearing has worsened over the past three years since her last evaluation. No ear pain, infections, drainage, or surgeries during this period. She has not tried hearing aids previously and is reluctant to use them, citing generational preferences.  She experiences chronic nasal drainage, described as clear and persistent, which she associates with her grandmother's similar condition. She is unsure if it is related to mealtimes or seasonal changes.   Past Medical History:  Diagnosis Date   Cancer [facility]) 1996   breast cx   Hypertension    PONV (postoperative nausea and vomiting)     Past Surgical History:  Procedure Laterality Date   ABDOMINAL HYSTERECTOMY     APPLICATION OF CRANIAL NAVIGATION N/A 10/27/2016   Procedure: APPLICATION OF CRANIAL NAVIGATION;  Surgeon: Fairy Levels, MD;  Location: [facility] OR;  Service: Neurosurgery;  Laterality: N/A;   CRANIOTOMY Left 10/27/2016   Procedure: CRANIOTOMY TUMOR EXCISION;  Surgeon: Fairy Levels, MD;  Location: [facility] OR;  Service: Neurosurgery;  Laterality: Left;   CRANIOTOMY Left 03/18/2017   Procedure: Left Frontal Cranial Wound Exploration with removal of bone flap;  Surgeon: Ditty, Morene Hicks, MD;  Location: [facility] OR;  Service: Neurosurgery;  Laterality: Left;   ESOPHAGOGASTRODUODENOSCOPY (EGD) WITH PROPOFOL  N/A 03/25/2017   Procedure: ESOPHAGOGASTRODUODENOSCOPY (EGD) WITH PROPOFOL ;  Surgeon: Rosalie Kitchens, MD;  Location: [facility] ENDOSCOPY;  Service: Endoscopy;  Laterality: N/A;   IR FLUORO GUIDE CV LINE RIGHT  03/19/2017   IR GENERIC HISTORICAL  11/02/2016   IR US  GUIDE VASC ACCESS RIGHT 11/02/2016 Toribio Faes, MD [facility]    IR GENERIC HISTORICAL  11/02/2016   IR FLUORO GUIDE CV LINE RIGHT 11/02/2016 Toribio Faes, MD [facility]   IR REMOVAL TUN CV CATH W/O [facility]  12/10/2016   IR US  GUIDE VASC ACCESS RIGHT  03/19/2017   MASTECTOMY     right breast    Family History  Problem Relation Age of Onset   Breast cancer Mother    Colon cancer Father     Social History:  reports that she has never smoked. She has never used smokeless tobacco. She reports current alcohol  use. She reports that she does not use drugs.  Allergies:  Allergies  Allergen Reactions   Wasp Venom Anaphylaxis   Ceftriaxone  Rash    Pt developed truncal rash on Ceftriaxone , flagyl  and also possibly still on keppra . It persisted for weeks with scarring. She did complete 2-3 days of abx while rash still going on. Dermatologist diagnosed as drug rash   Keppra  [Levetiracetam ] Rash    Possible drug rash (reaction while taking vancomycin , keppra  and flagyl  at the same time)   Aztreonam Rash    Drug exanthem noted 10/20/2017 -- not necessarily an absolute contraindication to give again; may potentially treat through with antihistamines and steroids +/- assistance of allergy Historically tolerates penicillin   Flagyl  [Metronidazole ] Rash    Possible drug rash (reaction while taking vancomycin , keppra  and flagyl  at the same time)   Sulfa Antibiotics Hives    Prior to Admission medications   Medication Sig Start Date End Date Taking? Authorizing Provider  aspirin 81 MG chewable tablet Chew 81 mg by mouth daily.  Yes [provider]  atorvastatin (LIPITOR) 20 MG tablet Take 20 mg by mouth daily. 06/16/11  Yes [provider]  benazepril  (LOTENSIN ) 20 MG tablet TAKE 1 TABLET ONCE DAILY. Patient taking differently: Take 20 mg by mouth daily. 04/10/17  Yes Swartz, Zachary T, MD  benazepril -hydrochlorthiazide (LOTENSIN  HCT) 20-25 MG tablet Take 1 tablet by mouth daily. 01/09/11  Yes [provider]  ertapenem  1 g in sodium  chloride 0.9 % 100 mL Inject 1 g into the vein at bedtime.   Yes [provider]  metoprolol  succinate (TOPROL  XL) 50 MG 24 hr tablet Take 1 tablet (50 mg total) by mouth daily. Take with or immediately following a meal. 11/17/16  Yes Babs Arthea DASEN, MD  Multiple Vitamin (MULTIVITAMIN WITH MINERALS) TABS tablet Take 1 tablet by mouth daily.   Yes [provider]  HYDROcodone -acetaminophen  (NORCO) 5-325 MG tablet Take 1 tablet by mouth every 6 (six) hours as needed for moderate pain (pain score 4-6). Patient not taking: Reported on 06/23/2024 06/03/23   Kingsley, Victoria K, DO  levETIRAcetam  (KEPPRA ) 500 MG tablet Take 1 tablet (500 mg total) by mouth 2 (two) times daily for 20 days. Patient not taking: Reported on 06/23/2024 11/21/17 06/23/24  Mackuen, Courteney Lyn, MD  lidocaine  (LIDODERM ) 5 % Place 1 patch onto the skin daily. Remove & Discard patch within 12 hours or as directed by MD Patient not taking: Reported on 06/23/2024 06/03/23   Kingsley, Victoria K, DO  methocarbamol  (ROBAXIN ) 500 MG tablet Take 1 tablet (500 mg total) by mouth 2 (two) times daily. Patient not taking: Reported on 06/23/2024 06/03/23   Kingsley, Victoria K, DO  Nutritional Supplements (FEEDING SUPPLEMENT, JEVITY 1.2 CAL,) LIQD Place 1,000 mLs into feeding tube daily. Patient not taking: Reported on 06/23/2024 03/27/17   Costella, Vincent J, PA-C  pantoprazole  (PROTONIX ) 40 MG tablet Take 1 tablet (40 mg total) by mouth daily. Patient not taking: Reported on 06/23/2024 12/24/16   Babs Arthea DASEN, MD    Pulse 75, temperature 98 F (36.7 C), height 5' 1.5 (1.562 m), weight 168 lb (76.2 kg), SpO2 96%. Exam: General: Communicates without difficulty, well nourished, no acute distress. Head: Normocephalic, no evidence injury, no tenderness, facial buttresses intact without stepoff. Face/sinus: No tenderness to palpation and percussion. Facial movement is normal and symmetric. Eyes: PERRL, EOMI. No  scleral icterus, conjunctivae clear. Neuro: CN II exam reveals vision grossly intact.  No nystagmus at any point of gaze. Ears: Auricles well formed without lesions.  Ear canals are intact without mass or lesion.  No erythema or edema is appreciated.  The TMs are intact without fluid. Nose: External evaluation reveals normal support and skin without lesions.  Dorsum is intact.  Anterior rhinoscopy reveals congested mucosa over anterior aspect of inferior turbinates and intact septum.  No purulence noted. Oral:  Oral cavity and oropharynx are intact, symmetric, without erythema or edema.  Mucosa is moist without lesions. Neck: Full range of motion without pain.  There is no significant lymphadenopathy.  No masses palpable.  Thyroid bed within normal limits to palpation.  Parotid glands and submandibular glands equal bilaterally without mass.  Trachea is midline. Neuro:  CN 2-12 grossly intact.   Assessment and Plan Assessment & Plan Bilateral sensorineural hearing loss History of bilateral sensorineural hearing loss, previously diagnosed over three years ago. Family reports worsening hearing. No prior use of hearing aids.  - Scheduled hearing test with audiologist - Discuss hearing aid options with audiologist post-hearing test -  Recheck in 1 year.  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 Symptoms may be exacerbated by temperature changes or mealtime. Nasal examination shows congestion. - Prescribed Atrovent  nasal spray, two sprays in each nostril once or twice daily as needed - Sent prescription to pharmacy  Annabel Gibeau W Arcangel Minion 06/23/2024, 3:08 PM

## 2024-08-15 ENCOUNTER — Ambulatory Visit (INDEPENDENT_AMBULATORY_CARE_PROVIDER_SITE_OTHER): Admitting: Audiology

## 2024-08-15 DIAGNOSIS — H903 Sensorineural hearing loss, bilateral: Secondary | ICD-10-CM | POA: Diagnosis not present

## 2024-08-15 NOTE — Progress Notes (Signed)
" °  [address] [place], [place] [address] [phone]  Audiological Evaluation    Name: Alexandria [name]     DOB:   10-Nov-1945      MRN:   [id number]                                                                                     Service Date: 08/15/2024     Accompanied by: self    Patient comes today after Dr. Karis, ENT sent a referral for a hearing evaluation due to concerns with hearing loss.   Symptoms Yes Details  Hearing loss  [x]  Known hearing loss in both ears, perceives it is getting worse and cannot understand soft spoken people  Tinnitus  [x]  Water running sound in both ears  Ear pain/ infections/pressure  []    Balance problems  []    Noise exposure history  []    Previous ear surgeries  []    Family history of hearing loss  []    Amplification  []    Other  []      Otoscopy: Right ear: Clear external ear canal and notable landmarks visualized on the tympanic membrane. Left ear:  Clear external ear canal and notable landmarks visualized on the tympanic membrane.  Tympanometry: Right ear: Type A - Normal external ear canal volume with normal middle ear pressure and normal tympanic membrane compliance. Findings are consistent with normal middle ear function. Left ear: Type A - Normal external ear canal volume with normal middle ear pressure and normal tympanic membrane compliance. Findings are consistent with normal middle ear function.  Hearing Evaluation The hearing test results were completed under headphones and results are deemed to be of good reliability. Test technique:  conventional    Pure tone Audiometry: Right ear- borderline normal to moderately severe sensorineural hearing loss from 125 Hz - 8000 Hz. Left ear-  borderline normal to moderate sensorineural hearing loss from 125 Hz - 8000 Hz.  Speech Audiometry: Right ear- Speech Reception Threshold (SRT) was obtained at 30 dBHL. Left ear-Speech Reception Threshold (SRT) was obtained at  30 dBHL.   Word Recognition Score Tested using NU-6 (recorded) Right ear: 88% was obtained at a presentation level of 70 dBHL with contralateral masking which is deemed as  excellent. Left ear: 88% was obtained at a presentation level of 70 dBHL with contralateral masking which is deemed as  excellent.   Impression: There is not a significant difference in pure-tone thresholds between ears., There is not a significant difference in the word recognition score in between ears.    Recommendations: Follow up with ENT as per MD.  Return for a hearing evaluation if concerns with hearing changes arise or per MD recommendation. Consider a communication needs assessment for amplification after medical clearance is obtained, if needed.   Koben Daman MARIE LEROUX-MARTINEZ, AUD  "
